# Patient Record
Sex: Male | Born: 1955 | Race: White | Hispanic: No | Smoking: Never smoker
Health system: Southern US, Community
[De-identification: ages and names within clinical notes are randomized; demographics above are authoritative.]

## PROBLEM LIST (undated history)

## (undated) DIAGNOSIS — F319 Bipolar disorder, unspecified: Secondary | ICD-10-CM

## (undated) DIAGNOSIS — E119 Type 2 diabetes mellitus without complications: Secondary | ICD-10-CM

## (undated) DIAGNOSIS — M1990 Unspecified osteoarthritis, unspecified site: Secondary | ICD-10-CM

## (undated) DIAGNOSIS — J45909 Unspecified asthma, uncomplicated: Secondary | ICD-10-CM

## (undated) DIAGNOSIS — D649 Anemia, unspecified: Secondary | ICD-10-CM

## (undated) DIAGNOSIS — I10 Essential (primary) hypertension: Secondary | ICD-10-CM

## (undated) DIAGNOSIS — G4730 Sleep apnea, unspecified: Secondary | ICD-10-CM

## (undated) DIAGNOSIS — K219 Gastro-esophageal reflux disease without esophagitis: Secondary | ICD-10-CM

## (undated) HISTORY — PX: CHOLECYSTECTOMY: SHX55

## (undated) HISTORY — PX: KNEE ARTHROSCOPY: SUR90

## (undated) HISTORY — PX: INNER EAR SURGERY: SHX679

## (undated) HISTORY — PX: APPENDECTOMY: SHX54

---

## 2014-01-06 ENCOUNTER — Ambulatory Visit: Payer: Self-pay | Admitting: Neurology

## 2014-04-26 DIAGNOSIS — M2040 Other hammer toe(s) (acquired), unspecified foot: Secondary | ICD-10-CM

## 2014-04-26 HISTORY — DX: Other hammer toe(s) (acquired), unspecified foot: M20.40

## 2015-03-22 DIAGNOSIS — K6289 Other specified diseases of anus and rectum: Secondary | ICD-10-CM | POA: Diagnosis not present

## 2015-03-22 DIAGNOSIS — J45909 Unspecified asthma, uncomplicated: Secondary | ICD-10-CM | POA: Diagnosis not present

## 2015-03-22 DIAGNOSIS — K613 Ischiorectal abscess: Secondary | ICD-10-CM | POA: Diagnosis not present

## 2015-04-03 DIAGNOSIS — Z23 Encounter for immunization: Secondary | ICD-10-CM | POA: Diagnosis not present

## 2015-05-03 DIAGNOSIS — Z125 Encounter for screening for malignant neoplasm of prostate: Secondary | ICD-10-CM | POA: Diagnosis not present

## 2015-05-03 DIAGNOSIS — Q984 Klinefelter syndrome, unspecified: Secondary | ICD-10-CM | POA: Diagnosis not present

## 2015-05-03 DIAGNOSIS — E291 Testicular hypofunction: Secondary | ICD-10-CM | POA: Diagnosis not present

## 2015-05-03 DIAGNOSIS — Z862 Personal history of diseases of the blood and blood-forming organs and certain disorders involving the immune mechanism: Secondary | ICD-10-CM | POA: Diagnosis not present

## 2015-05-03 DIAGNOSIS — J45909 Unspecified asthma, uncomplicated: Secondary | ICD-10-CM | POA: Diagnosis not present

## 2015-05-12 DIAGNOSIS — M8589 Other specified disorders of bone density and structure, multiple sites: Secondary | ICD-10-CM | POA: Diagnosis not present

## 2015-05-31 DIAGNOSIS — E291 Testicular hypofunction: Secondary | ICD-10-CM | POA: Diagnosis not present

## 2015-05-31 DIAGNOSIS — R350 Frequency of micturition: Secondary | ICD-10-CM | POA: Diagnosis not present

## 2015-05-31 DIAGNOSIS — Z6839 Body mass index (BMI) 39.0-39.9, adult: Secondary | ICD-10-CM | POA: Diagnosis not present

## 2015-05-31 DIAGNOSIS — M79642 Pain in left hand: Secondary | ICD-10-CM | POA: Diagnosis not present

## 2015-06-02 DIAGNOSIS — M1812 Unilateral primary osteoarthritis of first carpometacarpal joint, left hand: Secondary | ICD-10-CM | POA: Diagnosis not present

## 2015-06-02 DIAGNOSIS — M19042 Primary osteoarthritis, left hand: Secondary | ICD-10-CM | POA: Diagnosis not present

## 2015-06-02 DIAGNOSIS — E291 Testicular hypofunction: Secondary | ICD-10-CM | POA: Diagnosis not present

## 2015-06-02 DIAGNOSIS — M79642 Pain in left hand: Secondary | ICD-10-CM | POA: Diagnosis not present

## 2015-06-07 DIAGNOSIS — Z0000 Encounter for general adult medical examination without abnormal findings: Secondary | ICD-10-CM | POA: Diagnosis not present

## 2015-06-07 DIAGNOSIS — Z9181 History of falling: Secondary | ICD-10-CM | POA: Diagnosis not present

## 2015-06-07 DIAGNOSIS — Z1389 Encounter for screening for other disorder: Secondary | ICD-10-CM | POA: Diagnosis not present

## 2015-06-09 DIAGNOSIS — J342 Deviated nasal septum: Secondary | ICD-10-CM | POA: Diagnosis not present

## 2015-06-09 DIAGNOSIS — H6123 Impacted cerumen, bilateral: Secondary | ICD-10-CM | POA: Diagnosis not present

## 2015-06-21 DIAGNOSIS — M2550 Pain in unspecified joint: Secondary | ICD-10-CM | POA: Diagnosis not present

## 2015-06-21 DIAGNOSIS — Q984 Klinefelter syndrome, unspecified: Secondary | ICD-10-CM | POA: Diagnosis not present

## 2015-06-21 DIAGNOSIS — N62 Hypertrophy of breast: Secondary | ICD-10-CM | POA: Diagnosis not present

## 2015-06-21 DIAGNOSIS — Z6839 Body mass index (BMI) 39.0-39.9, adult: Secondary | ICD-10-CM | POA: Diagnosis not present

## 2015-07-04 DIAGNOSIS — E291 Testicular hypofunction: Secondary | ICD-10-CM | POA: Diagnosis not present

## 2015-07-11 DIAGNOSIS — L820 Inflamed seborrheic keratosis: Secondary | ICD-10-CM | POA: Diagnosis not present

## 2015-07-11 DIAGNOSIS — L219 Seborrheic dermatitis, unspecified: Secondary | ICD-10-CM | POA: Diagnosis not present

## 2015-07-27 DIAGNOSIS — M9902 Segmental and somatic dysfunction of thoracic region: Secondary | ICD-10-CM | POA: Diagnosis not present

## 2015-07-27 DIAGNOSIS — M9903 Segmental and somatic dysfunction of lumbar region: Secondary | ICD-10-CM | POA: Diagnosis not present

## 2015-07-27 DIAGNOSIS — S39012A Strain of muscle, fascia and tendon of lower back, initial encounter: Secondary | ICD-10-CM | POA: Diagnosis not present

## 2015-07-27 DIAGNOSIS — S29019A Strain of muscle and tendon of unspecified wall of thorax, initial encounter: Secondary | ICD-10-CM | POA: Diagnosis not present

## 2015-07-31 DIAGNOSIS — M9902 Segmental and somatic dysfunction of thoracic region: Secondary | ICD-10-CM | POA: Diagnosis not present

## 2015-07-31 DIAGNOSIS — M9903 Segmental and somatic dysfunction of lumbar region: Secondary | ICD-10-CM | POA: Diagnosis not present

## 2015-07-31 DIAGNOSIS — S39012A Strain of muscle, fascia and tendon of lower back, initial encounter: Secondary | ICD-10-CM | POA: Diagnosis not present

## 2015-07-31 DIAGNOSIS — S29019A Strain of muscle and tendon of unspecified wall of thorax, initial encounter: Secondary | ICD-10-CM | POA: Diagnosis not present

## 2015-08-02 DIAGNOSIS — S39012A Strain of muscle, fascia and tendon of lower back, initial encounter: Secondary | ICD-10-CM | POA: Diagnosis not present

## 2015-08-02 DIAGNOSIS — M9902 Segmental and somatic dysfunction of thoracic region: Secondary | ICD-10-CM | POA: Diagnosis not present

## 2015-08-02 DIAGNOSIS — M9903 Segmental and somatic dysfunction of lumbar region: Secondary | ICD-10-CM | POA: Diagnosis not present

## 2015-08-02 DIAGNOSIS — S29019A Strain of muscle and tendon of unspecified wall of thorax, initial encounter: Secondary | ICD-10-CM | POA: Diagnosis not present

## 2015-08-03 DIAGNOSIS — E291 Testicular hypofunction: Secondary | ICD-10-CM | POA: Diagnosis not present

## 2015-08-04 DIAGNOSIS — Z136 Encounter for screening for cardiovascular disorders: Secondary | ICD-10-CM | POA: Diagnosis not present

## 2015-08-04 DIAGNOSIS — Z79899 Other long term (current) drug therapy: Secondary | ICD-10-CM | POA: Diagnosis not present

## 2015-08-07 DIAGNOSIS — M19041 Primary osteoarthritis, right hand: Secondary | ICD-10-CM | POA: Diagnosis not present

## 2015-08-07 DIAGNOSIS — M1990 Unspecified osteoarthritis, unspecified site: Secondary | ICD-10-CM | POA: Diagnosis not present

## 2015-08-07 DIAGNOSIS — M19042 Primary osteoarthritis, left hand: Secondary | ICD-10-CM | POA: Diagnosis not present

## 2015-08-07 DIAGNOSIS — R768 Other specified abnormal immunological findings in serum: Secondary | ICD-10-CM | POA: Diagnosis not present

## 2015-08-07 DIAGNOSIS — M79646 Pain in unspecified finger(s): Secondary | ICD-10-CM | POA: Diagnosis not present

## 2015-08-07 DIAGNOSIS — M79643 Pain in unspecified hand: Secondary | ICD-10-CM | POA: Diagnosis not present

## 2015-08-24 DIAGNOSIS — Z6839 Body mass index (BMI) 39.0-39.9, adult: Secondary | ICD-10-CM | POA: Diagnosis not present

## 2015-08-24 DIAGNOSIS — M545 Low back pain: Secondary | ICD-10-CM | POA: Diagnosis not present

## 2015-08-24 DIAGNOSIS — M1990 Unspecified osteoarthritis, unspecified site: Secondary | ICD-10-CM | POA: Diagnosis not present

## 2015-08-24 DIAGNOSIS — R809 Proteinuria, unspecified: Secondary | ICD-10-CM | POA: Diagnosis not present

## 2015-08-24 DIAGNOSIS — R768 Other specified abnormal immunological findings in serum: Secondary | ICD-10-CM | POA: Diagnosis not present

## 2015-08-24 DIAGNOSIS — R109 Unspecified abdominal pain: Secondary | ICD-10-CM | POA: Diagnosis not present

## 2015-08-24 DIAGNOSIS — M79643 Pain in unspecified hand: Secondary | ICD-10-CM | POA: Diagnosis not present

## 2015-08-24 DIAGNOSIS — M47816 Spondylosis without myelopathy or radiculopathy, lumbar region: Secondary | ICD-10-CM | POA: Diagnosis not present

## 2015-08-29 DIAGNOSIS — R809 Proteinuria, unspecified: Secondary | ICD-10-CM | POA: Diagnosis not present

## 2015-08-29 DIAGNOSIS — R109 Unspecified abdominal pain: Secondary | ICD-10-CM | POA: Diagnosis not present

## 2015-08-29 DIAGNOSIS — N289 Disorder of kidney and ureter, unspecified: Secondary | ICD-10-CM | POA: Diagnosis not present

## 2015-09-05 DIAGNOSIS — E291 Testicular hypofunction: Secondary | ICD-10-CM | POA: Diagnosis not present

## 2015-09-12 DIAGNOSIS — Z6841 Body Mass Index (BMI) 40.0 and over, adult: Secondary | ICD-10-CM | POA: Diagnosis not present

## 2015-09-12 DIAGNOSIS — R109 Unspecified abdominal pain: Secondary | ICD-10-CM | POA: Diagnosis not present

## 2015-09-12 DIAGNOSIS — N281 Cyst of kidney, acquired: Secondary | ICD-10-CM | POA: Diagnosis not present

## 2015-09-27 DIAGNOSIS — N2889 Other specified disorders of kidney and ureter: Secondary | ICD-10-CM | POA: Diagnosis not present

## 2015-09-27 DIAGNOSIS — N200 Calculus of kidney: Secondary | ICD-10-CM | POA: Diagnosis not present

## 2015-09-28 DIAGNOSIS — N281 Cyst of kidney, acquired: Secondary | ICD-10-CM | POA: Diagnosis not present

## 2015-09-28 DIAGNOSIS — N401 Enlarged prostate with lower urinary tract symptoms: Secondary | ICD-10-CM | POA: Diagnosis not present

## 2015-09-28 DIAGNOSIS — N200 Calculus of kidney: Secondary | ICD-10-CM | POA: Diagnosis not present

## 2015-09-28 DIAGNOSIS — Z6839 Body mass index (BMI) 39.0-39.9, adult: Secondary | ICD-10-CM | POA: Diagnosis not present

## 2015-10-18 DIAGNOSIS — E291 Testicular hypofunction: Secondary | ICD-10-CM | POA: Diagnosis not present

## 2015-10-19 DIAGNOSIS — N200 Calculus of kidney: Secondary | ICD-10-CM | POA: Diagnosis not present

## 2015-10-19 DIAGNOSIS — Z6839 Body mass index (BMI) 39.0-39.9, adult: Secondary | ICD-10-CM | POA: Diagnosis not present

## 2015-11-22 DIAGNOSIS — E291 Testicular hypofunction: Secondary | ICD-10-CM | POA: Diagnosis not present

## 2015-11-23 DIAGNOSIS — M7580 Other shoulder lesions, unspecified shoulder: Secondary | ICD-10-CM | POA: Diagnosis not present

## 2015-11-23 DIAGNOSIS — M79643 Pain in unspecified hand: Secondary | ICD-10-CM | POA: Diagnosis not present

## 2015-11-23 DIAGNOSIS — M1990 Unspecified osteoarthritis, unspecified site: Secondary | ICD-10-CM | POA: Diagnosis not present

## 2015-11-23 DIAGNOSIS — R768 Other specified abnormal immunological findings in serum: Secondary | ICD-10-CM | POA: Diagnosis not present

## 2015-11-23 DIAGNOSIS — M545 Low back pain: Secondary | ICD-10-CM | POA: Diagnosis not present

## 2015-12-12 DIAGNOSIS — N62 Hypertrophy of breast: Secondary | ICD-10-CM | POA: Diagnosis not present

## 2015-12-12 DIAGNOSIS — E291 Testicular hypofunction: Secondary | ICD-10-CM | POA: Diagnosis not present

## 2015-12-12 DIAGNOSIS — Q984 Klinefelter syndrome, unspecified: Secondary | ICD-10-CM | POA: Diagnosis not present

## 2015-12-12 DIAGNOSIS — Z5181 Encounter for therapeutic drug level monitoring: Secondary | ICD-10-CM | POA: Diagnosis not present

## 2015-12-12 DIAGNOSIS — Z833 Family history of diabetes mellitus: Secondary | ICD-10-CM | POA: Diagnosis not present

## 2015-12-12 DIAGNOSIS — Z8349 Family history of other endocrine, nutritional and metabolic diseases: Secondary | ICD-10-CM | POA: Diagnosis not present

## 2015-12-12 DIAGNOSIS — Z125 Encounter for screening for malignant neoplasm of prostate: Secondary | ICD-10-CM | POA: Diagnosis not present

## 2015-12-12 DIAGNOSIS — R7309 Other abnormal glucose: Secondary | ICD-10-CM | POA: Diagnosis not present

## 2015-12-12 DIAGNOSIS — Z79899 Other long term (current) drug therapy: Secondary | ICD-10-CM | POA: Diagnosis not present

## 2015-12-20 DIAGNOSIS — E119 Type 2 diabetes mellitus without complications: Secondary | ICD-10-CM | POA: Diagnosis not present

## 2015-12-20 DIAGNOSIS — Z1389 Encounter for screening for other disorder: Secondary | ICD-10-CM | POA: Diagnosis not present

## 2015-12-20 DIAGNOSIS — Z6839 Body mass index (BMI) 39.0-39.9, adult: Secondary | ICD-10-CM | POA: Diagnosis not present

## 2015-12-25 DIAGNOSIS — L03031 Cellulitis of right toe: Secondary | ICD-10-CM | POA: Diagnosis not present

## 2015-12-25 DIAGNOSIS — B351 Tinea unguium: Secondary | ICD-10-CM | POA: Insufficient documentation

## 2015-12-25 DIAGNOSIS — E119 Type 2 diabetes mellitus without complications: Secondary | ICD-10-CM | POA: Insufficient documentation

## 2015-12-25 HISTORY — DX: Type 2 diabetes mellitus without complications: E11.9

## 2015-12-25 HISTORY — DX: Tinea unguium: B35.1

## 2015-12-27 DIAGNOSIS — Z8349 Family history of other endocrine, nutritional and metabolic diseases: Secondary | ICD-10-CM | POA: Diagnosis not present

## 2015-12-27 DIAGNOSIS — N62 Hypertrophy of breast: Secondary | ICD-10-CM | POA: Diagnosis not present

## 2015-12-27 DIAGNOSIS — Z5181 Encounter for therapeutic drug level monitoring: Secondary | ICD-10-CM | POA: Diagnosis not present

## 2015-12-27 DIAGNOSIS — Q984 Klinefelter syndrome, unspecified: Secondary | ICD-10-CM | POA: Diagnosis not present

## 2015-12-27 DIAGNOSIS — E291 Testicular hypofunction: Secondary | ICD-10-CM | POA: Diagnosis not present

## 2015-12-27 DIAGNOSIS — Z833 Family history of diabetes mellitus: Secondary | ICD-10-CM | POA: Diagnosis not present

## 2015-12-27 DIAGNOSIS — E1142 Type 2 diabetes mellitus with diabetic polyneuropathy: Secondary | ICD-10-CM | POA: Diagnosis not present

## 2015-12-27 DIAGNOSIS — B353 Tinea pedis: Secondary | ICD-10-CM | POA: Diagnosis not present

## 2015-12-27 DIAGNOSIS — E1165 Type 2 diabetes mellitus with hyperglycemia: Secondary | ICD-10-CM | POA: Diagnosis not present

## 2015-12-28 DIAGNOSIS — R69 Illness, unspecified: Secondary | ICD-10-CM | POA: Diagnosis not present

## 2016-01-01 DIAGNOSIS — E119 Type 2 diabetes mellitus without complications: Secondary | ICD-10-CM | POA: Diagnosis not present

## 2016-01-04 DIAGNOSIS — R062 Wheezing: Secondary | ICD-10-CM | POA: Diagnosis not present

## 2016-01-04 DIAGNOSIS — J309 Allergic rhinitis, unspecified: Secondary | ICD-10-CM | POA: Diagnosis not present

## 2016-01-04 DIAGNOSIS — Z6839 Body mass index (BMI) 39.0-39.9, adult: Secondary | ICD-10-CM | POA: Diagnosis not present

## 2016-01-04 DIAGNOSIS — E119 Type 2 diabetes mellitus without complications: Secondary | ICD-10-CM | POA: Diagnosis not present

## 2016-01-24 DIAGNOSIS — Z6839 Body mass index (BMI) 39.0-39.9, adult: Secondary | ICD-10-CM | POA: Diagnosis not present

## 2016-01-24 DIAGNOSIS — Q984 Klinefelter syndrome, unspecified: Secondary | ICD-10-CM | POA: Diagnosis not present

## 2016-01-24 DIAGNOSIS — E291 Testicular hypofunction: Secondary | ICD-10-CM | POA: Diagnosis not present

## 2016-01-26 DIAGNOSIS — E291 Testicular hypofunction: Secondary | ICD-10-CM | POA: Diagnosis not present

## 2016-01-26 DIAGNOSIS — R69 Illness, unspecified: Secondary | ICD-10-CM | POA: Diagnosis not present

## 2016-01-29 DIAGNOSIS — R69 Illness, unspecified: Secondary | ICD-10-CM | POA: Diagnosis not present

## 2016-01-31 DIAGNOSIS — E119 Type 2 diabetes mellitus without complications: Secondary | ICD-10-CM | POA: Diagnosis not present

## 2016-02-13 ENCOUNTER — Encounter (HOSPITAL_COMMUNITY): Payer: Self-pay | Admitting: *Deleted

## 2016-02-13 ENCOUNTER — Inpatient Hospital Stay (HOSPITAL_COMMUNITY)
Admission: AD | Admit: 2016-02-13 | Discharge: 2016-02-16 | DRG: 885 | Disposition: A | Discharge status: Home / Self Care | Payer: Medicare HMO | Attending: Psychiatry | Admitting: Psychiatry

## 2016-02-13 DIAGNOSIS — Z888 Allergy status to other drugs, medicaments and biological substances status: Secondary | ICD-10-CM | POA: Diagnosis not present

## 2016-02-13 DIAGNOSIS — Z79899 Other long term (current) drug therapy: Secondary | ICD-10-CM | POA: Diagnosis not present

## 2016-02-13 DIAGNOSIS — I10 Essential (primary) hypertension: Secondary | ICD-10-CM | POA: Diagnosis present

## 2016-02-13 DIAGNOSIS — R45850 Homicidal ideations: Secondary | ICD-10-CM | POA: Diagnosis present

## 2016-02-13 DIAGNOSIS — Z7984 Long term (current) use of oral hypoglycemic drugs: Secondary | ICD-10-CM

## 2016-02-13 DIAGNOSIS — F3130 Bipolar disorder, current episode depressed, mild or moderate severity, unspecified: Secondary | ICD-10-CM

## 2016-02-13 DIAGNOSIS — E119 Type 2 diabetes mellitus without complications: Secondary | ICD-10-CM | POA: Diagnosis present

## 2016-02-13 DIAGNOSIS — F319 Bipolar disorder, unspecified: Secondary | ICD-10-CM | POA: Diagnosis present

## 2016-02-13 HISTORY — DX: Unspecified asthma, uncomplicated: J45.909

## 2016-02-13 HISTORY — DX: Gastro-esophageal reflux disease without esophagitis: K21.9

## 2016-02-13 HISTORY — DX: Anemia, unspecified: D64.9

## 2016-02-13 HISTORY — DX: Unspecified osteoarthritis, unspecified site: M19.90

## 2016-02-13 HISTORY — DX: Sleep apnea, unspecified: G47.30

## 2016-02-13 HISTORY — DX: Bipolar disorder, unspecified: F31.9

## 2016-02-13 HISTORY — DX: Bipolar disorder, current episode depressed, mild or moderate severity, unspecified: F31.30

## 2016-02-13 HISTORY — DX: Type 2 diabetes mellitus without complications: E11.9

## 2016-02-13 HISTORY — DX: Essential (primary) hypertension: I10

## 2016-02-13 LAB — GLUCOSE, CAPILLARY: GLUCOSE-CAPILLARY: 104 mg/dL — AB (ref 65–99)

## 2016-02-13 MED ORDER — LORAZEPAM 1 MG PO TABS: 1.0000 mg | ORAL_TABLET | Freq: Four times a day (QID) | ORAL | Status: DC | PRN

## 2016-02-13 MED ORDER — ALUM & MAG HYDROXIDE-SIMETH 200-200-20 MG/5ML PO SUSP: 30.0000 mL | ORAL | Status: DC | PRN

## 2016-02-13 MED ORDER — OXCARBAZEPINE 300 MG PO TABS
300.0000 mg | ORAL_TABLET | Freq: Two times a day (BID) | ORAL | Status: DC
  Filled 2016-02-13 (×4): qty 1

## 2016-02-13 MED ORDER — MAGNESIUM HYDROXIDE 400 MG/5ML PO SUSP
30.0000 mL | Freq: Every day | ORAL | Status: DC | PRN
Start: 2016-02-13 — End: 2016-02-17
  Administered 2016-02-16: 30 mL via ORAL
  Filled 2016-02-13: qty 30

## 2016-02-13 MED ORDER — METFORMIN HCL ER 500 MG PO TB24
2000.0000 mg | ORAL_TABLET | Freq: Every day | ORAL | Status: DC
  Filled 2016-02-13 (×2): qty 4

## 2016-02-13 MED ORDER — BENZTROPINE MESYLATE 0.5 MG PO TABS
0.5000 mg | ORAL_TABLET | Freq: Two times a day (BID) | ORAL | Status: DC
  Filled 2016-02-13 (×4): qty 1

## 2016-02-13 MED ORDER — NICOTINE 21 MG/24HR TD PT24
21.0000 mg | MEDICATED_PATCH | Freq: Every day | TRANSDERMAL | Status: DC
Start: 2016-02-13 — End: 2016-02-13
  Filled 2016-02-13 (×2): qty 1

## 2016-02-13 MED ORDER — TRAZODONE HCL 50 MG PO TABS
50.0000 mg | ORAL_TABLET | Freq: Every evening | ORAL | Status: DC | PRN
  Administered 2016-02-14 – 2016-02-15 (×2): 50 mg via ORAL
  Filled 2016-02-13 (×2): qty 1

## 2016-02-13 MED ORDER — ACETAMINOPHEN 325 MG PO TABS: 650.0000 mg | ORAL_TABLET | Freq: Four times a day (QID) | ORAL | Status: DC | PRN

## 2016-02-13 MED ORDER — HALOPERIDOL 2 MG PO TABS
2.0000 mg | ORAL_TABLET | Freq: Two times a day (BID) | ORAL | Status: DC
  Administered 2016-02-13 – 2016-02-16 (×5): 2 mg via ORAL
  Filled 2016-02-13 (×10): qty 1

## 2016-02-13 NOTE — [facility] Group Notes (Signed)
[place] LCSW Group Therapy 02/13/2016 1:15 PM  Type of Therapy: Group Therapy- Feelings about Diagnosis  Participation Level: Active   Participation Quality:  Appropriate  Affect:  Appropriate  Cognitive: Alert and Oriented   Insight:  Developing   Engagement in Therapy: Developing/Improving and Engaged   Modes of Intervention: Clarification, Confrontation, Discussion, Education, Exploration, Limit-setting, Orientation, Problem-solving, Rapport Building, Art therapist, Socialization and Support  Description of Group:   This group will allow patients to explore their thoughts and feelings about diagnoses they have received. Patients will be guided to explore their level of understanding and acceptance of these diagnoses. Facilitator will encourage patients to process their thoughts and feelings about the reactions of others to their diagnosis, and will guide patients in identifying ways to discuss their diagnosis with significant others in their lives. This group will be process-oriented, with patients participating in exploration of their own experiences as well as giving and receiving support and challenge from other group members.  Summary of Progress/Problems:  Pt participated in group discussion and was able to discuss symptoms and stigma.  Therapeutic Modalities:   Cognitive Behavioral Therapy Solution Focused Therapy Motivational Interviewing Relapse Prevention Therapy  Peri Maris, LCSWA 02/13/2016 4:54 PM

## 2016-02-13 NOTE — Progress Notes (Signed)
Adult Psychoeducational Group Note  Date:  02/13/2016 Time:  9:26 PM  Group Topic/Focus:  Wrap-Up Group:   The focus of this group is to help patients review their daily goal of treatment and discuss progress on daily workbooks.   Participation Level:  Active  Participation Quality:  Appropriate  Affect:  Appropriate  Cognitive:  Alert  Insight: Appropriate  Engagement in Group:  Engaged  Modes of Intervention:  Discussion  Additional Comments:  On a scale between 1-10, (1=worse, 10=best) patient rated his day a 7. Patient's goal for today was to get in touch with his pastor and brother. Patient met goal.  Peter Buck 02/13/2016, 9:26 PM
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
## 2016-02-13 NOTE — Tx Team (Signed)
Initial Treatment Plan 02/13/2016 1:12 PM Peter Buck [id number]    PATIENT STRESSORS: Health problems Medication change or noncompliance   PATIENT STRENGTHS: Ability for insight Average or above average intelligence Capable of independent living Communication skills Financial means General fund of knowledge Motivation for treatment/growth Religious Affiliation   PATIENT IDENTIFIED PROBLEMS: "Well, my goal is to be released."  "I want help with getting a therapist who knows about Bipolar."                    DISCHARGE CRITERIA:  Improved stabilization in mood, thinking, and/or behavior Need for constant or close observation no longer present Reduction of life-threatening or endangering symptoms to within safe limits Verbal commitment to aftercare and medication compliance  PRELIMINARY DISCHARGE PLAN: Attend aftercare/continuing care group Outpatient therapy Return to previous living arrangement  PATIENT/FAMILY INVOLVEMENT: This treatment plan has been presented to and reviewed with the patient, Peter Buck, and/or family member.  The patient and family have been given the opportunity to ask questions and make suggestions.  Jamie Kato, RN 02/13/2016, 1:12 PM

## 2016-02-13 NOTE — Progress Notes (Signed)
Recreation Therapy Notes  Animal-Assisted Activity (AAA) Program Checklist/Progress Notes Patient Eligibility Criteria Checklist & Daily Group note for Rec TxIntervention  Date: 08.29.2017 Time: 2:45pm Location: [address]   AAA/T Program Assumption of Risk Form signed by Patient/ or Parent Legal Guardian Yes  Patient is free of allergies or sever asthma Yes  Patient reports no fear of animals Yes  Patient reports no history of cruelty to animals Yes  Patient understands his/her participation is voluntary Yes  Patient washes hands before animal contact Yes  Patient washes hands after animal contact Yes  Behavioral Response: Engaged, Attentive, Appropriate   Education:Hand Washing, Appropriate Animal Interaction   Education Outcome: Acknowledges education.   Clinical Observations/Feedback: Patient attended session and interacted appropriately with therapy dog and peers.   Laureen Ochs Ellamae Lybeck, LRT/CTRS   Dori Devino L 02/13/2016 3:03 PM

## 2016-02-14 DIAGNOSIS — F319 Bipolar disorder, unspecified: Secondary | ICD-10-CM

## 2016-02-14 DIAGNOSIS — Z79899 Other long term (current) drug therapy: Secondary | ICD-10-CM

## 2016-02-14 DIAGNOSIS — Z888 Allergy status to other drugs, medicaments and biological substances status: Secondary | ICD-10-CM

## 2016-02-14 LAB — GLUCOSE, CAPILLARY
Glucose-Capillary: 130 mg/dL — ABNORMAL HIGH (ref 65–99)
Glucose-Capillary: 123 mg/dL — ABNORMAL HIGH (ref 65–99)

## 2016-02-14 MED ORDER — METFORMIN HCL ER 500 MG PO TB24
2000.0000 mg | ORAL_TABLET | Freq: Every day | ORAL | Status: DC
Start: 2016-02-14 — End: 2016-02-14
  Filled 2016-02-14: qty 1

## 2016-02-14 MED ORDER — DICLOFENAC SODIUM 75 MG PO TBEC
75.0000 mg | DELAYED_RELEASE_TABLET | Freq: Two times a day (BID) | ORAL | Status: DC | PRN
  Administered 2016-02-14: 75 mg via ORAL

## 2016-02-14 MED ORDER — HALOPERIDOL 2 MG PO TABS
2.0000 mg | ORAL_TABLET | Freq: Once | ORAL | Status: AC
  Administered 2016-02-14: 2 mg via ORAL
  Filled 2016-02-14: qty 1

## 2016-02-14 MED ORDER — OXCARBAZEPINE 300 MG PO TABS
300.0000 mg | ORAL_TABLET | Freq: Every day | ORAL | Status: DC
  Administered 2016-02-15 – 2016-02-16 (×2): 300 mg via ORAL
  Filled 2016-02-14 (×4): qty 1

## 2016-02-14 MED ORDER — BENZTROPINE MESYLATE 0.5 MG PO TABS: 0.5000 mg | ORAL_TABLET | Freq: Two times a day (BID) | ORAL | Status: DC | PRN

## 2016-02-14 MED ORDER — METFORMIN HCL ER 500 MG PO TB24
2000.0000 mg | ORAL_TABLET | Freq: Every day | ORAL | Status: DC
  Administered 2016-02-14 – 2016-02-15 (×2): 2000 mg via ORAL
  Filled 2016-02-14 (×4): qty 4

## 2016-02-14 NOTE — Progress Notes (Signed)
D: Pt presents with flat affect and depressed mood. Pt mood is  labile. Pt becomes agitated with himself, unprovoked and no triggers. Pt verbalized having thoughts to punch the wall or cut himself. Writer was able to help pt relax by doing a crossword puzzle and listening to [employer]. Pt was offered prn Ativan for agitation. Pt refused, stating that the meds make him feel droopy. Pt refused to take Trileptal and Cogentin this morning stating that if makes him droopy. MD made aware.  A: Medications offered as ordered. Orders reviewed with pt. Verbal support provided. Pt encouraged to attend groups. 15 minute checks performed for safety. R: Pt stated goal is to reschedule his eye exam. Writer provided pt with the number to [facility] and pt was able to reschedule his appt.
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
## 2016-02-14 NOTE — Progress Notes (Signed)
   D: Pt appeared to be focused on his metformin. Informed the writer that he takes it at supper. Informed pt that supper is 1700 at [facility]. Stated that he'd rather take it at that time, than morning. Pt also requested to get a dose for today, since he'd missed today's dose.  Pt has no other questions or concerns.    A:Order was modified to meet pt's needs. However, dose wasn't approved for tonight's dose.  Support and encouragement was offered. 15 min checks continued for safety.  R: Pt remains safe.
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
## 2016-02-14 NOTE — Progress Notes (Signed)
Recreation Therapy Notes  Date: 02/14/16 Time: 0930 Location: [address] Dayroom  Group Topic: Stress Management  Goal Area(s) Addresses:  Patient will verbalize importance of using healthy stress management.  Patient will identify positive emotions associated with healthy stress management.   Intervention: Stress Management   Activity :  Progressive Muscle Relaxation.  LRT introduced to technique of progressive muscle relaxation to patients.  LRT read a script to guide the patients through the exercise.  Patients were to follow along as LRT read the script to engage in the activity.  Education:  Stress Management, Discharge Planning.   Education Outcome: Needs additional education  Clinical Observations/Feedback:  Pt did not attend group.    Victorino Sparrow, LRT/CTRS         Ria Comment, Ruvim Risko A 02/14/2016 3:02 PM

## 2016-02-14 NOTE — BHH Group Notes (Signed)
[place] LCSW Group Therapy 02/14/2016 1:15 PM  Type of Therapy: Group Therapy- Emotion Regulation  Participation Level: Active   Participation Quality:  Appropriate  Affect: Appropriate  Cognitive: Alert and Oriented   Insight:  Developing/Improving  Engagement in Therapy: Developing/Improving and Engaged   Modes of Intervention: Clarification, Confrontation, Discussion, Education, Exploration, Limit-setting, Orientation, Problem-solving, Rapport Building, Art therapist, Socialization and Support  Summary of Progress/Problems: The topic for group today was emotional regulation. This group focused on both positive and negative emotion identification and allowed group members to process ways to identify feelings, regulate negative emotions, and find healthy ways to manage internal/external emotions. Group members were asked to reflect on a time when their reaction to an emotion led to a negative outcome and explored how alternative responses using emotion regulation would have benefited them. Group members were also asked to discuss a time when emotion regulation was utilized when a negative emotion was experienced. Pt expressed that anger is the emotion he has the most difficulty regulating. He interacted well with patients and identified coping skills and strategies to implement at discharge.   Peri Maris, LCSWA 02/14/2016 4:36 PM

## 2016-02-14 NOTE — Tx Team (Signed)
Interdisciplinary Treatment and Diagnostic Plan Update  02/14/2016 Time of Session: 8:02 AM  Peter Buck MRN: [id number]  Principal Diagnosis: <principal problem not specified>  Secondary Diagnoses: Active Problems:   Bipolar disorder ([place])   Current Medications:  Current Facility-Administered Medications  Medication Dose Route Frequency Provider Last Rate Last Dose  . acetaminophen (TYLENOL) tablet 650 mg  650 mg Oral Q6H PRN Niel Hummer, NP      . alum & mag hydroxide-simeth (MAALOX/MYLANTA) 200-200-20 MG/5ML suspension 30 mL  30 mL Oral Q4H PRN Niel Hummer, NP      . benztropine (COGENTIN) tablet 0.5 mg  0.5 mg Oral BID Niel Hummer, NP      . haloperidol (HALDOL) tablet 2 mg  2 mg Oral BID Niel Hummer, NP   2 mg at 02/13/16 1726  . LORazepam (ATIVAN) tablet 1 mg  1 mg Oral Q6H PRN Niel Hummer, NP      . magnesium hydroxide (MILK OF MAGNESIA) suspension 30 mL  30 mL Oral Daily PRN Niel Hummer, NP      . metFORMIN (GLUCOPHAGE-XR) 24 hr tablet 2,000 mg  2,000 mg Oral QAC supper Jenne Campus, MD      . Oxcarbazepine (TRILEPTAL) tablet 300 mg  300 mg Oral BID Niel Hummer, NP      . traZODone (DESYREL) tablet 50 mg  50 mg Oral QHS PRN Niel Hummer, NP        PTA Medications: Prescriptions Prior to Admission  Medication Sig Dispense Refill Last Dose  . albuterol (PROAIR HFA) 108 (90 Base) MCG/ACT inhaler Inhale 2 puffs into the lungs every 6 (six) hours as needed for wheezing or shortness of breath.   Past Month at Unknown time  . cetirizine (ZYRTEC) 10 MG tablet Take 10 mg by mouth every morning.    02/12/2016 at Unknown time  . diclofenac (VOLTAREN) 75 MG EC tablet Take 75 mg by mouth 2 (two) times daily as needed for moderate pain (for bone pain).    02/09/2016 at Unknown time  . haloperidol (HALDOL) 2 MG tablet Take 2 mg by mouth 2 (two) times daily.   02/12/2016 at Unknown time  . metFORMIN (GLUCOPHAGE-XR) 500 MG 24 hr tablet Take 2,000 mg by mouth daily with  supper.   02/11/2016  . mometasone (NASONEX) 50 MCG/ACT nasal spray Place 2 sprays into the nose every morning.    02/09/2016  . Oxcarbazepine (TRILEPTAL) 300 MG tablet Take 300 mg by mouth 2 (two) times daily.   02/12/2016 at Unknown time  . rosuvastatin (CRESTOR) 5 MG tablet Take 5 mg by mouth every Monday, Wednesday, and Friday at 6 PM.   02/12/2016 at Unknown time  . traMADol (ULTRAM) 50 MG tablet Take 50 mg by mouth every 6 (six) hours as needed for severe pain.   02/11/2016    Treatment Modalities: Medication Management, Group therapy, Case management,  1 to 1 session with clinician, Psychoeducation, Recreational therapy.   Physician Treatment Plan for Primary Diagnosis: <principal problem not specified> Long Term Goal(s): Improvement in symptoms so as ready for discharge  Short Term Goals: Ability to identify changes in lifestyle to reduce recurrence of condition will improve, Ability to demonstrate self-control will improve and Ability to identify and develop effective coping behaviors will improve  Medication Management: Evaluate patient's response, side effects, and tolerance of medication regimen.  Therapeutic Interventions: 1 to 1 sessions, Unit Group sessions and Medication administration.  Evaluation of Outcomes: Not  Met  Physician Treatment Plan for Secondary Diagnosis: Active Problems:   Bipolar disorder ([place])   Long Term Goal(s): Improvement in symptoms so as ready for discharge  Short Term Goals: Ability to disclose and discuss suicidal ideas, Ability to identify and develop effective coping behaviors will improve and Compliance with prescribed medications will improve  Medication Management: Evaluate patient's response, side effects, and tolerance of medication regimen.  Therapeutic Interventions: 1 to 1 sessions, Unit Group sessions and Medication administration.  Evaluation of Outcomes: Not Met   RN Treatment Plan for Primary Diagnosis: <principal problem not  specified> Long Term Goal(s): Knowledge of disease and therapeutic regimen to maintain health will improve  Short Term Goals: Ability to remain free from injury will improve, Ability to participate in decision making will improve and Compliance with prescribed medications will improve  Medication Management: RN will administer medications as ordered by provider, will assess and evaluate patient's response and provide education to patient for prescribed medication. RN will report any adverse and/or side effects to prescribing provider.  Therapeutic Interventions: 1 on 1 counseling sessions, Psychoeducation, Medication administration, Evaluate responses to treatment, Monitor vital signs and CBGs as ordered, Perform/monitor CIWA, COWS, AIMS and Fall Risk screenings as ordered, Perform wound care treatments as ordered.  Evaluation of Outcomes: Not Met   LCSW Treatment Plan for Primary Diagnosis: <principal problem not specified> Long Term Goal(s): Safe transition to appropriate next level of care at discharge, Engage patient in therapeutic group addressing interpersonal concerns.  Short Term Goals: Engage patient in aftercare planning with referrals and resources, Increase emotional regulation and Increase skills for wellness and recovery  Therapeutic Interventions: Assess for all discharge needs, 1 to 1 time with Social worker, Explore available resources and support systems, Assess for adequacy in community support network, Educate family and significant other(s) on suicide prevention, Complete Psychosocial Assessment, Interpersonal group therapy.  Evaluation of Outcomes: Not Met   Progress in Treatment: Attending groups: Pt is new to milieu, continuing to assess   Participating in groups: Pt is new to milieu, continuing to assess  Taking medication as prescribed: Yes, MD continues to assess for medication changes as needed Toleration medication: Yes, no side effects reported at this  time Family/Significant other contact made: No, CSW assessing for appropriate contact Patient understands diagnosis: Continuing to assess Discussing patient identified problems/goals with staff: Yes Medical problems stabilized or resolved: Yes Denies suicidal/homicidal ideation: Yes Issues/concerns per patient self-inventory: None Other: N/A  New problem(s) identified: None identified at this time.   New Short Term/Long Term Goal(s): None identified at this time.   Discharge Plan or Barriers: CSW will assess for appropriate discharge plan and relevant barriers.   Reason for Continuation of Hospitalization: Mood instability Depression Medication stabilization Suicidal ideation  Estimated Length of Stay: 3-5 days  Attendees: Patient: 02/14/2016  8:02 AM  Physician: Dr. Ananias Pilgrim 02/14/2016  8:02 AM  Nursing: Darrol Angel, RN; Gaylan Gerold, RN 02/14/2016  8:02 AM  RN Care Manager: Lars Pinks, RN 02/14/2016  8:02 AM  Social Worker: Peri Maris, LCSW; Kristin Drinkard, LCSW 02/14/2016  8:02 AM  Recreational Therapist:  02/14/2016  8:02 AM  Other: Lindell Spar, NP; Samuel Jester, NP 02/14/2016  8:02 AM  Other:  02/14/2016  8:02 AM  Other: 02/14/2016  8:02 AM    Scribe for Treatment Team: Bo Mcclintock, LCSW 02/14/2016 8:02 AM

## 2016-02-14 NOTE — Progress Notes (Signed)
Patient attended group, but did not wish to share.

## 2016-02-14 NOTE — H&P (Signed)
Psychiatric Admission Assessment Adult  Patient Identification: Peter Buck MRN:  [id number] Date of Evaluation:  02/14/2016 Chief Complaint:  BIPOLAR DISORDER Principal Diagnosis: <principal problem not specified> Diagnosis:   Patient Active Problem List   Diagnosis Date Noted  . Bipolar disorder ([place]) [F31.9] 02/13/2016   History of Present Illness: " I was getting angry with my Barrister's clerk at the [facility] [employer] who was demeaning and belittling me in front of others." He reported Art therapist to the student advocate who took the instructor's side. He went to PCP's office and "vented"  to the PA that he was very frustrated with this Instructor and that he will go to [place] and buy a gun and shoot this instructor and wait for police to come and kill him. Patient does not have or able to buy firearm due to his disorder. PA recommended patient voluntarily admit self to hospital and when he refused police was called who initially let him go but a petition was taken out on him and police came back and took him to [facility] who transferred him here. Associated Signs/Symptoms: Depression Symptoms:  depressed mood, psychomotor agitation, (Hypo) Manic Symptoms:  Irritable Mood, Labiality of Mood, Anxiety Symptoms:  None Psychotic Symptoms:  Paranoia,Instructor PTSD Symptoms: NA Total Time spent with patient: 1.5 hours  Past Psychiatric History: Multiple past psychiatric admissions  Is the patient at risk to self? Yes.    Has the patient been a risk to self in the past 6 months? Yes.    Has the patient been a risk to self within the distant past? Yes.    Is the patient a risk to others? Yes.    Has the patient been a risk to others in the past 6 months? Yes.    Has the patient been a risk to others within the distant past? Yes.     Prior Inpatient Therapy: Prior Inpatient Therapy: Yes Prior Therapy Dates:  (multiple) Prior Therapy Facilty/Provider(s):   (various) Reason for Treatment:  (Bipolar D/O) Prior Outpatient Therapy: Prior Outpatient Therapy: Yes Prior Therapy Dates:  (up until 2016) Prior Therapy Facilty/Provider(s):  ([facility]) Reason for Treatment:  (Bipolar D/O) Does patient have an ACCT team?: No (was discharged due to lack of IP stays) Does patient have Intensive In-House Services?  : No Does patient have [facility] services? : No Does patient have P4CC services?: Unknown  Alcohol Screening: 1. How often do you have a drink containing alcohol?: Never 9. Have you or someone else been injured as a result of your drinking?: No 10. Has a relative or friend or a doctor or another health worker been concerned about your drinking or suggested you cut down?: No Alcohol Use Disorder Identification Test Final Score (AUDIT): 0 Brief Intervention: AUDIT score less than 7 or less-screening does not suggest unhealthy drinking-brief intervention not indicated Substance Abuse History in the last 12 months:  No. Consequences of Substance Abuse: Negative Previous Psychotropic Medications: Yes  Psychological Evaluations: No  Past Medical History:  Past Medical History:  Diagnosis Date  . Anemia   . Arthritis   . Asthma   . Bipolar disorder ([place])   . Diabetes mellitus without complication ([place])   . GERD (gastroesophageal reflux disease)   . Hypertension   . Sleep apnea    History reviewed. No pertinent surgical history. Family History: History reviewed. No pertinent family history. Family Psychiatric  History: None Tobacco Screening: Have you used any form of tobacco in the last 30 days? (Cigarettes,  Smokeless Tobacco, Cigars, and/or Pipes): No Social History:  History  Alcohol Use No     History  Drug Use No    Additional Social History: Marital status: Single    Prescriptions: see MAR History of alcohol / drug use?: No history of alcohol / drug abuse                    Allergies:   Allergies  Allergen Reactions   . Augmentin [Amoxicillin-Pot Clavulanate] Anaphylaxis  . Atorvastatin Other (See Comments)    Severe back ache  . Guiatuss [Guaifenesin]   . Pseudoephedrine    Lab Results:  Results for orders placed or performed during the hospital encounter of 02/13/16 (from the past 48 hour(s))  Glucose, capillary     Status: Abnormal   Collection Time: 02/13/16  4:51 PM  Result Value Ref Range   Glucose-Capillary 104 (H) 65 - 99 mg/dL  Glucose, capillary     Status: Abnormal   Collection Time: 02/14/16  6:26 AM  Result Value Ref Range   Glucose-Capillary 130 (H) 65 - 99 mg/dL   Comment 1 Notify RN    Comment 2 Document in Chart     Blood Alcohol level:  No results found for: [facility]  Metabolic Disorder Labs:  No results found for: HGBA1C, MPG No results found for: PROLACTIN No results found for: CHOL, TRIG, HDL, CHOLHDL, VLDL, LDLCALC  Current Medications: Current Facility-Administered Medications  Medication Dose Route Frequency Provider Last Rate Last Dose  . acetaminophen (TYLENOL) tablet 650 mg  650 mg Oral Q6H PRN Niel Hummer, NP      . alum & mag hydroxide-simeth (MAALOX/MYLANTA) 200-200-20 MG/5ML suspension 30 mL  30 mL Oral Q4H PRN Niel Hummer, NP      . benztropine (COGENTIN) tablet 0.5 mg  0.5 mg Oral BID Niel Hummer, NP      . haloperidol (HALDOL) tablet 2 mg  2 mg Oral BID Niel Hummer, NP   2 mg at 02/14/16 0804  . LORazepam (ATIVAN) tablet 1 mg  1 mg Oral Q6H PRN Niel Hummer, NP      . magnesium hydroxide (MILK OF MAGNESIA) suspension 30 mL  30 mL Oral Daily PRN Niel Hummer, NP      . metFORMIN (GLUCOPHAGE-XR) 24 hr tablet 2,000 mg  2,000 mg Oral QAC supper Jenne Campus, MD      . Oxcarbazepine (TRILEPTAL) tablet 300 mg  300 mg Oral BID Niel Hummer, NP      . traZODone (DESYREL) tablet 50 mg  50 mg Oral QHS PRN Niel Hummer, NP       PTA Medications: Prescriptions Prior to Admission  Medication Sig Dispense Refill Last Dose  . albuterol (PROAIR HFA) 108  (90 Base) MCG/ACT inhaler Inhale 2 puffs into the lungs every 6 (six) hours as needed for wheezing or shortness of breath.   Past Month at Unknown time  . cetirizine (ZYRTEC) 10 MG tablet Take 10 mg by mouth every morning.    02/12/2016 at Unknown time  . diclofenac (VOLTAREN) 75 MG EC tablet Take 75 mg by mouth 2 (two) times daily as needed for moderate pain (for bone pain).    02/09/2016 at Unknown time  . haloperidol (HALDOL) 2 MG tablet Take 2 mg by mouth 2 (two) times daily.   02/12/2016 at Unknown time  . metFORMIN (GLUCOPHAGE-XR) 500 MG 24 hr tablet Take 2,000 mg by mouth daily with supper.  02/11/2016  . mometasone (NASONEX) 50 MCG/ACT nasal spray Place 2 sprays into the nose every morning.    02/09/2016  . Oxcarbazepine (TRILEPTAL) 300 MG tablet Take 300 mg by mouth 2 (two) times daily.   02/12/2016 at Unknown time  . rosuvastatin (CRESTOR) 5 MG tablet Take 5 mg by mouth every Monday, Wednesday, and Friday at 6 PM.   02/12/2016 at Unknown time  . traMADol (ULTRAM) 50 MG tablet Take 50 mg by mouth every 6 (six) hours as needed for severe pain.   02/11/2016    Musculoskeletal: Strength & Muscle Tone: within normal limits Gait & Station: normal Patient leans: N/A  Psychiatric Specialty Exam: Physical Exam  ROS  Blood pressure 139/65, pulse 80, temperature 97.8 F (36.6 C), temperature source Oral, resp. rate 18, height 6\' 4"  (1.93 m), weight (!) 144.7 kg (319 lb).Body mass index is 38.83 kg/m.  General Appearance: Casual  Eye Contact:  Good  Speech:  Clear and Coherent and Normal Rate  Volume:  Normal  Mood:  Angry and Irritable  Affect:  Labile  Thought Process:  Coherent and Goal Directed  Orientation:  Full (Time, Place, and Person)  Thought Content:  Logical  Suicidal Thoughts:  No  Homicidal Thoughts:  No  Memory:  Immediate;   Fair Recent;   Fair Remote;   Fair  Judgement:  Impaired  Insight:  Lacking  Psychomotor Activity:  Increased  Concentration:  Concentration: Fair  and Attention Span: Fair  Recall:  [employer] of Knowledge:  Fair  Language:  Good  Akathisia:  No  Handed:  Right  AIMS (if indicated):     Assets:  Communication Skills Desire for Improvement Financial Resources/Insurance Housing Social Support  ADL's:  Intact  Cognition:  WNL  Sleep:  Number of Hours: 6.5       Treatment Plan Summary: Daily contact with patient to assess and evaluate symptoms and progress in treatment, Medication management and Plan --participate in group, milieu and group activities Patient says he  is only able to take trileptal 300mg  daily, haldol 2mg  bid. H e uses CPAP at home and is requesting it here Observation Level/Precautions:  15 minute checks  Laboratory:  CBC Chemistry Profile HbAIC UA  Psychotherapy:    Medications:    Consultations:    Discharge Concerns:    Estimated LOS:  Other:     I certify that inpatient services furnished can reasonably be expected to improve the patient's condition.    Ruffin Frederick, MD 8/30/201710:36 AM

## 2016-02-14 NOTE — BHH Suicide Risk Assessment (Signed)
[place] INPATIENT:  Family/Significant Other Suicide Prevention Education  Suicide Prevention Education:  Education Completed; Taylor Brodersen, Pt''s brother [phone],  (name of family member/significant other) has been identified by the patient as the family member/significant other with whom the patient will be residing, and identified as the person(s) who will aid the patient in the event of a mental health crisis (suicidal ideations/suicide attempt).  With written consent from the patient, the family member/significant other has been provided the following suicide prevention education, prior to the and/or following the discharge of the patient.  The suicide prevention education provided includes the following:  Suicide risk factors  Suicide prevention and interventions  National Suicide Hotline telephone number  [facility] assessment telephone number  [facility] Emergency Assistance [place] and/or Residential Mobile Crisis Unit telephone number  Request made of family/significant other to:  Remove weapons (e.g., guns, rifles, knives), all items previously/currently identified as safety concern.    Remove drugs/medications (over-the-counter, prescriptions, illicit drugs), all items previously/currently identified as a safety concern.  The family member/significant other verbalizes understanding of the suicide prevention education information provided.  The family member/significant other agrees to remove the items of safety concern listed above.  Bo Mcclintock 02/14/2016, 5:06 PM

## 2016-02-15 LAB — GLUCOSE, CAPILLARY
GLUCOSE-CAPILLARY: 122 mg/dL — AB (ref 65–99)
GLUCOSE-CAPILLARY: 126 mg/dL — AB (ref 65–99)
Glucose-Capillary: 134 mg/dL — ABNORMAL HIGH (ref 65–99)

## 2016-02-15 NOTE — Progress Notes (Signed)
D: Pt denies SI/HI/AVH. Pt is pleasant and cooperative. Pt stated he was feeling a little better due to him not having wild thoughts and the medications working.   A: Pt was offered support and encouragement. Pt was given scheduled medications. Pt was encourage to attend groups. Q 15 minute checks were done for safety.   R:Pt attends groups and interacts well with peers and staff. Pt is taking medication. Pt has no complaints.Pt receptive to treatment and safety maintained on unit.

## 2016-02-15 NOTE — Progress Notes (Signed)
[facility] MD Progress Note  02/15/2016 2:26 PM Peter Buck  MRN:  [id number] Subjective:  Patient reports feeling better today, slept well and enjoying the groups including the one condcted by the [facility]. Patient discussed in team today, social worker will make calls to ACTT in patient's community to see if any of them have a lot. Patient says his brother will will pay for him to get outpatient ingividual therapy. He will also explore going back  tothe * [facility] Principal Problem: <principal problem not specified> Diagnosis:   Patient Active Problem List   Diagnosis Date Noted  . Bipolar disorder ([place]) [F31.9] 02/13/2016   Total Time spent with patient: 30 minutes  Past Psychiatric History: See Admission Assessment  Past Medical History:  Past Medical History:  Diagnosis Date  . Anemia   . Arthritis   . Asthma   . Bipolar disorder ([place])   . Diabetes mellitus without complication ([place])   . GERD (gastroesophageal reflux disease)   . Hypertension   . Sleep apnea    History reviewed. No pertinent surgical history. Family History: History reviewed. No pertinent family history. Family Psychiatric  History: None Social History:  History  Alcohol Use No     History  Drug Use No    Social History   Social History  . Marital status: Divorced    Spouse name: N/A  . Number of children: N/A  . Years of education: N/A   Social History Main Topics  . Smoking status: Never Smoker  . Smokeless tobacco: Never Used  . Alcohol use No  . Drug use: No  . Sexual activity: Not Asked   Other Topics Concern  . None   Social History Narrative  . None   Additional Social History:    Prescriptions: see MAR History of alcohol / drug use?: No history of alcohol / drug abuse                    Sleep: Good  Appetite:  Good  Current Medications: Current Facility-Administered Medications  Medication Dose Route Frequency Provider Last Rate Last Dose   . acetaminophen (TYLENOL) tablet 650 mg  650 mg Oral Q6H PRN Niel Hummer, NP      . alum & mag hydroxide-simeth (MAALOX/MYLANTA) 200-200-20 MG/5ML suspension 30 mL  30 mL Oral Q4H PRN Niel Hummer, NP      . benztropine (COGENTIN) tablet 0.5 mg  0.5 mg Oral BID PRN Sueanne Margarita, MD      . diclofenac (VOLTAREN) EC tablet 75 mg  75 mg Oral BID PRN Kerrie Buffalo, NP   75 mg at 02/14/16 1311  . haloperidol (HALDOL) tablet 2 mg  2 mg Oral BID Niel Hummer, NP   2 mg at 02/15/16 0758  . LORazepam (ATIVAN) tablet 1 mg  1 mg Oral Q6H PRN Niel Hummer, NP      . magnesium hydroxide (MILK OF MAGNESIA) suspension 30 mL  30 mL Oral Daily PRN Niel Hummer, NP      . metFORMIN (GLUCOPHAGE-XR) 24 hr tablet 2,000 mg  2,000 mg Oral QAC supper Jenne Campus, MD   2,000 mg at 02/14/16 1624  . Oxcarbazepine (TRILEPTAL) tablet 300 mg  300 mg Oral Daily Sueanne Margarita, MD   300 mg at 02/15/16 0758  . traZODone (DESYREL) tablet 50 mg  50 mg Oral QHS PRN Niel Hummer, NP   50 mg at 02/14/16 2232  Lab Results:  Results for orders placed or performed during the hospital encounter of 02/13/16 (from the past 48 hour(s))  Glucose, capillary     Status: Abnormal   Collection Time: 02/13/16  4:51 PM  Result Value Ref Range   Glucose-Capillary 104 (H) 65 - 99 mg/dL  Glucose, capillary     Status: Abnormal   Collection Time: 02/14/16  6:26 AM  Result Value Ref Range   Glucose-Capillary 130 (H) 65 - 99 mg/dL   Comment 1 Notify RN    Comment 2 Document in Chart   Glucose, capillary     Status: Abnormal   Collection Time: 02/14/16  5:08 PM  Result Value Ref Range   Glucose-Capillary 123 (H) 65 - 99 mg/dL   Comment 1 Notify RN    Comment 2 Document in Chart   Glucose, capillary     Status: Abnormal   Collection Time: 02/15/16  5:40 AM  Result Value Ref Range   Glucose-Capillary 134 (H) 65 - 99 mg/dL   Comment 1 Notify RN   Glucose, capillary     Status: Abnormal   Collection Time: 02/15/16 12:15 PM   Result Value Ref Range   Glucose-Capillary 122 (H) 65 - 99 mg/dL    Blood Alcohol level:  No results found for: [facility]  Metabolic Disorder Labs: No results found for: HGBA1C, MPG No results found for: PROLACTIN No results found for: CHOL, TRIG, HDL, CHOLHDL, VLDL, LDLCALC  Physical Findings: AIMS: Facial and Oral Movements Muscles of Facial Expression: None, normal Lips and Perioral Area: None, normal Jaw: None, normal Tongue: None, normal,Extremity Movements Upper (arms, wrists, hands, fingers): None, normal Lower (legs, knees, ankles, toes): None, normal, Trunk Movements Neck, shoulders, hips: None, normal, Overall Severity Severity of abnormal movements (highest score from questions above): None, normal Incapacitation due to abnormal movements: None, normal Patient's awareness of abnormal movements (rate only patient's report): No Awareness, Dental Status Current problems with teeth and/or dentures?: No Does patient usually wear dentures?: No  CIWA:    COWS:     Musculoskeletal: Strength & Muscle Tone: within normal limits Gait & Station: normal Patient leans: N/A  Psychiatric Specialty Exam: Physical Exam  ROS  Blood pressure 139/77, pulse 75, temperature 97.8 F (36.6 C), temperature source Oral, resp. rate 16, height 6\' 4"  (1.93 m), weight (!) 144.7 kg (319 lb).Body mass index is 38.83 kg/m.  General Appearance: Casual  Eye Contact:  Good  Speech:  Garbled  Volume:  Normal  Mood:  Euthymic  Affect:  Appropriate and Congruent  Thought Process:  Coherent and Goal Directed  Orientation:  Full (Time, Place, and Person)  Thought Content:  Logical  Suicidal Thoughts:  No  Homicidal Thoughts:  No  Memory:  Immediate;   Fair Recent;   Fair Remote;   Fair  Judgement:  Fair  Insight:  Fair  Psychomotor Activity:  Normal  Concentration:  Concentration: Fair and Attention Span: Fair  Recall:  [employer] of Knowledge:  Good  Language:  Good  Akathisia:  No   Handed:  Right  AIMS (if indicated):     Assets:  Communication Skills Desire for Improvement Financial Resources/Insurance Housing Physical Health Transportation  ADL's:  Intact  Cognition:  WNL  Sleep:  Number of Hours: 6.5     Treatment Plan Summary: Daily contact with patient to assess and evaluate symptoms and progress in treatment, Medication management and Plan Continue medications and participate in group, milieu and individual  therapies  Chinita Pester  Sigurd Sos, MD 02/15/2016, 2:26 PM

## 2016-02-15 NOTE — BHH Group Notes (Signed)
[facility] Group Therapy 02/15/2016 1:15pm  Type of Therapy: Mental Health Association Presentation  Participation Level: Active  Participation Quality: Attentive  Affect: Appropriate  Cognitive: Oriented  Insight: Developing/Improving  Engagement in Therapy: Engaged  Modes of Intervention: Discussion, Education and Socialization  Summary of Progress/Problems: Mental Health Association ([place]) Speaker came to talk about his personal journey with substance abuse and addiction. The pt processed ways by which to relate to the speaker. [place] speaker provided handouts and educational information pertaining to groups and services offered by the [facility]. Pt was engaged in speaker's presentation and was receptive to resources provided.    Peri Maris, [place] 02/15/2016 1:13 PM

## 2016-02-15 NOTE — Progress Notes (Signed)
   Pt requested to get his haldol, stating that he's having racing thoughts. Writer spoke with extender to get a one time dose for haldol. Pt also received his trazodone which he previously refused.

## 2016-02-15 NOTE — Progress Notes (Signed)
  D: Pt approached the writer and was visibly upset, and anxious. When approached pt immediately began to explain that his roommate is from a different region and likes to have the room hotter. Stating "he's not used to air conditioners and I don't like it hot". Pt asked if he could move into another room. Writer explained that before moving we needed to attempt to compromise. Pt agreed.  Writer attempted to redirect the pt to discuss his day. Pt stated, "I'm not going to take my medicine tonight because I don't have my CPAP." Stated he's not taking anything that "depresses his breathing". Pt has no questions or concerns.    A:  Support and encouragement was offered. 15 min checks continued for safety.  R: Pt remains safe.

## 2016-02-15 NOTE — Progress Notes (Signed)
[place] Group Notes:  (Nursing/MHT/Case Management/Adjunct)  Date:  02/15/2016  Time:  11:05 PM  Type of Therapy:  Psychoeducational Skills  Participation Level:  Active  Participation Quality:  Appropriate  Affect:  Appropriate  Cognitive:  Appropriate  Insight:  Appropriate  Engagement in Group:  Developing/Improving  Modes of Intervention:  Education  Summary of Progress/Problems: The patient shared with the group that he had a good day and that he was pleased with the fact that his blood sugar was checked during the day. He also stated that he enjoyed the group which focused on substance abuse.   Mahitha Hickling S 02/15/2016, 11:05 PM

## 2016-02-15 NOTE — Plan of Care (Signed)
Problem: Coping: Goal: Ability to cope will improve Outcome: Progressing Pt stated he was feeling a little better today

## 2016-02-15 NOTE — Progress Notes (Signed)
Adult Psychoeducational Group Note  Date:  02/15/2016 Time:  10:45 AM  Group Topic/Focus:  Personal Choices and Values:   The focus of this group is to help patients assess and explore the importance of values in their lives, how their values affect their decisions, how they express their values and what opposes their expression.   Participation Level:  Active  Participation Quality:  Appropriate  Affect:  Appropriate  Cognitive:  Appropriate  Insight: Appropriate  Engagement in Group:  Engaged  Modes of Intervention:  Discussion  Additional Comments:  Pt stated he is feeling good.  Pt states lifestyle and leisure changes he wants to make is to join silver sneakers group at gym instead of working out alone. Tonia Brooms D 02/15/2016, 10:45 AM

## 2016-02-15 NOTE — BHH Group Notes (Signed)
[place] Group Notes:  (Nursing/MHT/Case Management/Adjunct)  Date:  02/15/2016  Time:  9:20 AM  Type of Therapy:  Psychoeducational Skills  Participation Level:  Minimal  Participation Quality:  Appropriate  Affect:  Flat  Cognitive:  Alert  Insight:  Limited  Engagement in Group:  Limited  Modes of Intervention:  Discussion and Education  Summary of Progress/Problems: Rudie attended group and was seen looking down at his booklet. No distress noted at this time.   Jana Swartzlander E 02/15/2016, 9:20 AM

## 2016-02-15 NOTE — Progress Notes (Signed)
  D: Pt presents anxious on approach. Pt reports decreased depression today. Pt reports that he feels less agitated this morning. Pt have not had any episodes of aggression this morning or afternoon. Pt reports that he's easily agitated by noise and loud people. Pt reports that when he's agitated he usually wants to punch something. Pt denies HI today. Pt compliant with taking all scheduled meds this morning without refusing. Pt refused several meds yesterday. Pt compliant with attending groups and participates.  A: Medications reviewed with pt.  Medications administered as ordered per MD. Verbal support provided. Pt encouraged to attend groups. 15 minute checks performed for safety. R: Pt receptive to tx.

## 2016-02-16 LAB — GLUCOSE, CAPILLARY
GLUCOSE-CAPILLARY: 120 mg/dL — AB (ref 65–99)
Glucose-Capillary: 130 mg/dL — ABNORMAL HIGH (ref 65–99)

## 2016-02-16 MED ORDER — TRAZODONE HCL 50 MG PO TABS: 50.0000 mg | ORAL_TABLET | Freq: Every evening | ORAL | 0 refills | Status: DC | PRN

## 2016-02-16 MED ORDER — BENZTROPINE MESYLATE 0.5 MG PO TABS: 0.5000 mg | ORAL_TABLET | Freq: Two times a day (BID) | ORAL | 0 refills | Status: DC | PRN

## 2016-02-16 MED ORDER — OXCARBAZEPINE 300 MG PO TABS: 300.0000 mg | ORAL_TABLET | Freq: Every day | ORAL | 0 refills | Status: DC

## 2016-02-16 MED ORDER — METFORMIN HCL 1000 MG PO TABS: 2000.0000 mg | ORAL_TABLET | Freq: Every day | ORAL | Status: DC

## 2016-02-16 MED ORDER — KETOPROFEN 75 MG PO CAPS: 75.0000 mg | ORAL_CAPSULE | Freq: Two times a day (BID) | ORAL | 0 refills | Status: DC | PRN

## 2016-02-16 MED ORDER — HALOPERIDOL 2 MG PO TABS: 2.0000 mg | ORAL_TABLET | Freq: Two times a day (BID) | ORAL | 0 refills | Status: DC

## 2016-02-16 NOTE — Progress Notes (Signed)
Recreation Therapy Notes  Date:02/16/16 Time:0930 Location: [place]  Group Topic: Stress Management  Goal Area(s) Addresses:  Patient will verbalize importance of using healthy stress management.  Patient will identify positive emotions associated with healthy stress management.   Intervention: Stress Management  Activity: Guided Automotive engineer. LRT introduced the stress management technique guided imagery. LRT read script that guided patients in how to perform the technique. Patients were to follow along as LRT read the script to engage in the technique.  Education:Stress Management, Discharge Planning.   Education Outcome:Needs additional education  Clinical Observations/Feedback:Pt did not attend group.   Victorino Sparrow, LRT/CTRS         Victorino Sparrow A 02/16/2016 12:54 PM

## 2016-02-16 NOTE — BHH Suicide Risk Assessment (Addendum)
[facility] Discharge Suicide Risk Assessment   Principal Problem: <principal problem not specified> Discharge Diagnoses:  Patient Active Problem List   Diagnosis Date Noted  . Bipolar disorder ([place]) [F31.9] 02/13/2016    Total Time spent with patient: 30 minutes  Musculoskeletal: Strength & Muscle Tone: within normal limits Gait & Station: normal Patient leans: N/A  Psychiatric Specialty Exam: ROS  Blood pressure 133/75, pulse 92, temperature 98 F (36.7 C), temperature source Oral, resp. rate 16, height 6\' 4"  (1.93 m), weight (!) 144.7 kg (319 lb).Body mass index is 38.83 kg/m.  General Appearance: Casual  Eye Contact::  Good  Speech:  Clear and Coherent and Normal Rate409  Volume:  Normal  Mood:  Euthymic  Affect:  Congruent  Thought Process:  Coherent and Goal Directed  Orientation:  Full (Time, Place, and Person)  Thought Content:  Logical  Suicidal Thoughts:  No  Homicidal Thoughts:  No  Memory:  Immediate;   Fair Recent;   Fair Remote;   Fair  Judgement:  Fair  Insight:  Fair  Psychomotor Activity:  Normal  Concentration:  Fair  Recall:  [employer] of Knowledge:Fair  Language: Good  Akathisia:  No  Handed:  Right  AIMS (if indicated):     Assets:  Communication Skills Desire for Improvement Financial Resources/Insurance Housing Physical Health Social Support Transportation Vocational/Educational  Sleep:  Number of Hours: 6.5  Cognition: WNL  ADL's:  Intact   Mental Status Per Nursing Assessment::   On Admission:  Suicidal ideation indicated by patient, Suicide plan, Plan includes specific time, place, or method, Self-harm thoughts, Intention to act on suicide plan, Belief that plan would result in death, Thoughts of violence towards others, Plan to harm others, Intention to act on plan to harm others  Demographic Factors:  Male, Caucasian, Living alone and Unemployed  Loss Factors: None  Historical Factors: Prior suicide attempts and  Impulsivity  Risk Reduction Factors:   Religious beliefs about death and Positive therapeutic relationship  Continued Clinical Symptoms:  None  Cognitive Features That Contribute To Risk:  None    Suicide Risk:  Minimal: No identifiable suicidal ideation.  Patients presenting with no risk factors but with morbid ruminations; may be classified as minimal risk based on the severity of the depressive symptoms    Plan Of Care/Follow-up recommendations:  Patient will follow up with outpatient appointment, wants to make his own appointment for Individual therapy. Social had made appointment for patient with [facility] who will assess patient for ACTT   Ruffin Frederick, MD 02/16/2016, 9:58 AM

## 2016-02-16 NOTE — Discharge Summary (Signed)
Physician Discharge Summary Note  Patient:  Peter Buck is an 60 y.o., male MRN:  [id number] DOB:  January 01, 1956 Patient phone:  [phone] (home)  Patient address:   [address] [place] [address],  Total Time spent with patient: Greater than 30 minutes  Date of Admission:  02/13/2016  Date of Discharge: 0  Reason for Admission: Homicidal threats.  Principal Problem: Bipolar disorder, depressed.  Discharge Diagnoses: Patient Active Problem List   Diagnosis Date Noted  . Bipolar affective disorder, depressed ([place]) [F31.30] 02/13/2016    Priority: High   Past Psychiatric History: Bipolar affective disorder.  Past Medical History:  Past Medical History:  Diagnosis Date  . Anemia   . Arthritis   . Asthma   . Bipolar disorder ([place])   . Diabetes mellitus without complication ([place])   . GERD (gastroesophageal reflux disease)   . Hypertension   . Sleep apnea    History reviewed. No pertinent surgical history.  Family History: History reviewed. No pertinent family history.  Family Psychiatric  History: See H&P  Social History:  History  Alcohol Use No     History  Drug Use No    Social History   Social History  . Marital status: Divorced    Spouse name: N/A  . Number of children: N/A  . Years of education: N/A   Social History Main Topics  . Smoking status: Never Smoker  . Smokeless tobacco: Never Used  . Alcohol use No  . Drug use: No  . Sexual activity: Not Asked   Other Topics Concern  . None   Social History Narrative  . None   Hospital Course: " I was getting angry with my Barrister's clerk at the [facility] who was demeaning and belittling me in front of others." He reported Art therapist to the student advocate who took the instructor's side. He went to PCP's office and "vented"  to the PA that he was very frustrated with this Instructor and that he will go to [place] and buy a gun and shoot this instructor  and wait for police to come and kill him. Patient does not have or able to buy firearm due to his disorder. PA recommended patient voluntarily admit self to hospital and when he refused police was called who initially let him go but a petition was taken out on him and police came back and took him to [facility] who transferred him here.  Upon his arrival & admision to the [facility] adult unit, Delyle was evaluated & his presenting symptoms identified. The medication management for the presenting symptoms were discussed & initiated targeting those symptoms. He was enrolled in the group counseling sessions & encouraged to participate in the unit programming. His other pre-existing medical problems were identified & his home medications restarted accordingly.   Peter Buck was evaluated on daily basis by the clinical providers to assure his response to his treatment regimen.As his treatment progressed,  improvement was noted as evidenced by his report of decreasing symptoms. He was encouraged to update his providers on his progress by daily completion of a self inventory assessment, noting mood, mental status, any new symptoms, anxiety & or concerns.  Edwards's symptoms responded well to his treatment regimen combined with a therapeutic and supportive environment. He was motivated for recovery as evidenced by a positive/appropriate behavior and his interaction with the staff & fellow patients.He also worked closely with the treatment team & case manager to develop a discharge plan  with appropriate goals to maintain mood stability after discharge. Coping skills, problem solving as well as relaxation therapies were also part of his unit programming.  Upon discharge, Peter Buck was in much improved condition than upon admission.His symptoms were reported as significantly decreased or resolved completely. He adamantly denies any SIHI,  AVH, delusional thoughts & or paranoia. He was motivated to continue taking  medication with a goal of continued improvement in mental health. He will continue psychiatric care on an outpatient basis as noted below. He is provided with all the necessary information required to make this appointment without problems. He was medicated & discharged on; Benztropine 0.5 mg for prevention of EPS, Haldol 2 mg for mood control, Trileptal 300 mg for mood stabilization & Trazodone 50 mg for insomnia. He left [facility] with all personal belongings in no apparent distress. Transportation per [employer]  Physical Findings: AIMS: Facial and Oral Movements Muscles of Facial Expression: None, normal Lips and Perioral Area: None, normal Jaw: None, normal Tongue: None, normal,Extremity Movements Upper (arms, wrists, hands, fingers): None, normal Lower (legs, knees, ankles, toes): None, normal, Trunk Movements Neck, shoulders, hips: None, normal, Overall Severity Severity of abnormal movements (highest score from questions above): None, normal Incapacitation due to abnormal movements: None, normal Patient's awareness of abnormal movements (rate only patient's report): No Awareness, Dental Status Current problems with teeth and/or dentures?: No Does patient usually wear dentures?: No  CIWA:    COWS:     Musculoskeletal: Strength & Muscle Tone: within normal limits Gait & Station: normal Patient leans: N/A  Psychiatric Specialty Exam: Physical Exam  Constitutional: He appears well-developed.  HENT:  Head: Normocephalic.  Eyes: Pupils are equal, round, and reactive to light.  Neck: Normal range of motion.  Cardiovascular: Normal rate.   Respiratory: Effort normal.  GI: Soft.  Genitourinary:  Genitourinary Comments: Denies any issues in this area  Musculoskeletal: Normal range of motion.  Neurological: He is alert.  Skin: Skin is warm.    Review of Systems  Constitutional: Negative.   HENT: Negative.   Eyes: Negative.   Respiratory: Negative.    Cardiovascular: Negative.   Gastrointestinal: Negative.   Genitourinary: Negative.   Musculoskeletal: Negative.   Skin: Negative.   Neurological: Negative.   Endo/Heme/Allergies: Negative.   Psychiatric/Behavioral: Positive for depression (stable). Negative for hallucinations, memory loss, substance abuse and suicidal ideas. The patient has insomnia (Stable). The patient is not nervous/anxious.     Blood pressure 133/75, pulse 92, temperature 98 F (36.7 C), temperature source Oral, resp. rate 16, height 6\' 4"  (1.93 m), weight (!) 144.7 kg (319 lb).Body mass index is 38.83 kg/m.  See Md's SRA   Have you used any form of tobacco in the last 30 days? (Cigarettes, Smokeless Tobacco, Cigars, and/or Pipes): No  Has this patient used any form of tobacco in the last 30 days? (Cigarettes, Smokeless Tobacco, Cigars, and/or Pipes) Yes, No  Blood Alcohol level:  No results found for: [facility]  Metabolic Disorder Labs:  No results found for: HGBA1C, MPG No results found for: PROLACTIN No results found for: CHOL, TRIG, HDL, CHOLHDL, VLDL, LDLCALC  See Psychiatric Specialty Exam and Suicide Risk Assessment completed by Attending Physician prior to discharge.  Discharge destination:  Home  Is patient on multiple antipsychotic therapies at discharge:  No   Has Patient had three or more failed trials of antipsychotic monotherapy by history:  No  Recommended Plan for Multiple Antipsychotic Therapies: NA    Medication List  STOP taking these medications   cetirizine 10 MG tablet Commonly known as:  ZYRTEC   diclofenac 75 MG EC tablet Commonly known as:  VOLTAREN Replaced by:  ketoprofen 75 MG capsule   metFORMIN 500 MG 24 hr tablet Commonly known as:  GLUCOPHAGE-XR Replaced by:  metFORMIN 1000 MG tablet   mometasone 50 MCG/ACT nasal spray Commonly known as:  NASONEX   PROAIR HFA 108 (90 Base) MCG/ACT inhaler Generic drug:  albuterol   rosuvastatin 5 MG tablet Commonly known as:   CRESTOR   traMADol 50 MG tablet Commonly known as:  ULTRAM     TAKE these medications     Indication  benztropine 0.5 MG tablet Commonly known as:  COGENTIN Take 1 tablet (0.5 mg total) by mouth 2 (two) times daily as needed for tremors.  Indication:  Extrapyramidal Reaction caused by Medications   haloperidol 2 MG tablet Commonly known as:  HALDOL Take 1 tablet (2 mg total) by mouth 2 (two) times daily. For mood control What changed:  additional instructions  Indication:  Mood control   ketoprofen 75 MG capsule Commonly known as:  ORUDIS Take 1 capsule (75 mg total) by mouth 2 (two) times daily between meals as needed. For arthritis Replaces:  diclofenac 75 MG EC tablet  Indication:  Joint Damage causing Pain and Loss of Function   metFORMIN 1000 MG tablet Commonly known as:  GLUCOPHAGE Take 2 tablets (2,000 mg total) by mouth daily with breakfast. Replaces:  metFORMIN 500 MG 24 hr tablet  Indication:  Type 2 Diabetes   Oxcarbazepine 300 MG tablet Commonly known as:  TRILEPTAL Take 1 tablet (300 mg total) by mouth daily. For mood stabilization What changed:  when to take this  additional instructions  Indication:  Mood stabilization   traZODone 50 MG tablet Commonly known as:  DESYREL Take 1 tablet (50 mg total) by mouth at bedtime as needed for sleep.  Indication:  Trouble Sleeping      Follow-up Information    [facility] Follow up on 02/21/2016.   Why:  Hospital follow up appointment is on Wednesday Sept. 6th at 10:30am.  Contact information: [address], [place], [place] [address] Phone: [phone] Fax: [phone]         Follow-up recommendations: Activity:  As tolerated Diet: As recommended by your primary care doctor. Keep all scheduled follow-up appointments as recommended.   Comments: Patient is instructed prior to discharge to: Take all medications as prescribed by his/her mental healthcare provider. Report any adverse effects and or reactions  from the medicines to his/her outpatient provider promptly. Patient has been instructed & cautioned: To not engage in alcohol and or illegal drug use while on prescription medicines. In the event of worsening symptoms, patient is instructed to call the crisis hotline, 911 and or go to the nearest ED for appropriate evaluation and treatment of symptoms. To follow-up with his/her primary care provider for your other medical issues, concerns and or health care needs.   Signed: Encarnacion Slates, NP, PMHNP, FNP-BC 02/18/2016, 3:53 PM

## 2016-02-16 NOTE — Progress Notes (Signed)
Data. Patient denies SI/HI/AVH. Patient interacting well with staff and other patients. His affect is bright and he has been noted smiling often. On his self assessment he reports 0/10 for depression and hopelessness and 1/10 for anxiety. His goal for today is, "Discharge planning".  Action. Emotional support and encouragement offered. Education provided on medication, indications and side effect. Q 15 minute checks done for safety. Response. Safety on the unit maintained through 15 minute checks.  Medications taken as prescribed. Attended groups. Remained calm and appropriate through out shift.  Pt. discharged to lobby, to be picked up by "Melburn Popper". Belongings sheet reviewed and signed by pt. and all belongings, including scripts, sent home. Paperwork reviewed and pt. able to verbalize understanding of education. Pt. in no current distress and ambulatory.

## 2016-02-16 NOTE — Progress Notes (Signed)
  [facility] Adult Case Management Discharge Plan :  Will you be returning to the same living situation after discharge:  Yes,  home At discharge, do you have transportation home?: Yes,  Pt plans to use uber.  Do you have the ability to pay for your medications: Yes,  insurance   Release of information consent forms completed and in the chart;  Patient's signature needed at discharge.  Patient to Follow up at: Follow-up Information    [facility] Follow up on 02/21/2016.   Why:  Hospital follow up appointment is on Wednesday Sept. 6th at 10:30am.  Contact information: [address], [place], [place] [address] Phone: [phone] Fax: [phone]          Next level of care provider has access to [place] and Suicide Prevention discussed: Yes,  with patient and brother   Have you used any form of tobacco in the last 30 days? (Cigarettes, Smokeless Tobacco, Cigars, and/or Pipes): No  Has patient been referred to the Quitline?: N/A patient is not a smoker  Patient has been referred for addiction treatment: [place] MSW, [place]  02/16/2016, 12:19 PM

## 2016-02-20 DIAGNOSIS — E119 Type 2 diabetes mellitus without complications: Secondary | ICD-10-CM | POA: Diagnosis not present

## 2016-02-20 DIAGNOSIS — Z7984 Long term (current) use of oral hypoglycemic drugs: Secondary | ICD-10-CM | POA: Diagnosis not present

## 2016-02-20 DIAGNOSIS — E291 Testicular hypofunction: Secondary | ICD-10-CM | POA: Diagnosis not present

## 2016-02-20 DIAGNOSIS — E1165 Type 2 diabetes mellitus with hyperglycemia: Secondary | ICD-10-CM | POA: Diagnosis not present

## 2016-02-20 DIAGNOSIS — Q984 Klinefelter syndrome, unspecified: Secondary | ICD-10-CM | POA: Diagnosis not present

## 2016-02-21 DIAGNOSIS — Z5181 Encounter for therapeutic drug level monitoring: Secondary | ICD-10-CM | POA: Diagnosis not present

## 2016-02-21 DIAGNOSIS — Z23 Encounter for immunization: Secondary | ICD-10-CM | POA: Diagnosis not present

## 2016-02-21 DIAGNOSIS — Q984 Klinefelter syndrome, unspecified: Secondary | ICD-10-CM | POA: Diagnosis not present

## 2016-02-21 DIAGNOSIS — Z8349 Family history of other endocrine, nutritional and metabolic diseases: Secondary | ICD-10-CM | POA: Diagnosis not present

## 2016-02-21 DIAGNOSIS — N62 Hypertrophy of breast: Secondary | ICD-10-CM | POA: Diagnosis not present

## 2016-02-21 DIAGNOSIS — E291 Testicular hypofunction: Secondary | ICD-10-CM | POA: Diagnosis not present

## 2016-02-21 DIAGNOSIS — E1142 Type 2 diabetes mellitus with diabetic polyneuropathy: Secondary | ICD-10-CM | POA: Diagnosis not present

## 2016-02-21 DIAGNOSIS — Z6839 Body mass index (BMI) 39.0-39.9, adult: Secondary | ICD-10-CM | POA: Diagnosis not present

## 2016-02-21 DIAGNOSIS — Z833 Family history of diabetes mellitus: Secondary | ICD-10-CM | POA: Diagnosis not present

## 2016-02-22 DIAGNOSIS — M79643 Pain in unspecified hand: Secondary | ICD-10-CM | POA: Diagnosis not present

## 2016-02-22 DIAGNOSIS — M1990 Unspecified osteoarthritis, unspecified site: Secondary | ICD-10-CM | POA: Diagnosis not present

## 2016-02-22 DIAGNOSIS — R768 Other specified abnormal immunological findings in serum: Secondary | ICD-10-CM | POA: Diagnosis not present

## 2016-02-22 DIAGNOSIS — M7580 Other shoulder lesions, unspecified shoulder: Secondary | ICD-10-CM | POA: Diagnosis not present

## 2016-02-29 DIAGNOSIS — E119 Type 2 diabetes mellitus without complications: Secondary | ICD-10-CM | POA: Diagnosis not present

## 2016-03-06 DIAGNOSIS — E291 Testicular hypofunction: Secondary | ICD-10-CM | POA: Diagnosis not present

## 2016-03-07 DIAGNOSIS — R69 Illness, unspecified: Secondary | ICD-10-CM | POA: Diagnosis not present

## 2016-03-12 DIAGNOSIS — Z6839 Body mass index (BMI) 39.0-39.9, adult: Secondary | ICD-10-CM | POA: Diagnosis not present

## 2016-03-12 DIAGNOSIS — M549 Dorsalgia, unspecified: Secondary | ICD-10-CM | POA: Diagnosis not present

## 2016-03-12 DIAGNOSIS — M79605 Pain in left leg: Secondary | ICD-10-CM | POA: Diagnosis not present

## 2016-03-20 DIAGNOSIS — M7580 Other shoulder lesions, unspecified shoulder: Secondary | ICD-10-CM | POA: Diagnosis not present

## 2016-03-20 DIAGNOSIS — M160 Bilateral primary osteoarthritis of hip: Secondary | ICD-10-CM | POA: Diagnosis not present

## 2016-03-20 DIAGNOSIS — M25552 Pain in left hip: Secondary | ICD-10-CM | POA: Diagnosis not present

## 2016-03-20 DIAGNOSIS — M79643 Pain in unspecified hand: Secondary | ICD-10-CM | POA: Diagnosis not present

## 2016-03-20 DIAGNOSIS — E291 Testicular hypofunction: Secondary | ICD-10-CM | POA: Diagnosis not present

## 2016-03-20 DIAGNOSIS — M1990 Unspecified osteoarthritis, unspecified site: Secondary | ICD-10-CM | POA: Diagnosis not present

## 2016-03-20 DIAGNOSIS — R768 Other specified abnormal immunological findings in serum: Secondary | ICD-10-CM | POA: Diagnosis not present

## 2016-03-29 DIAGNOSIS — M79605 Pain in left leg: Secondary | ICD-10-CM | POA: Diagnosis not present

## 2016-03-29 DIAGNOSIS — Z6839 Body mass index (BMI) 39.0-39.9, adult: Secondary | ICD-10-CM | POA: Diagnosis not present

## 2016-04-03 DIAGNOSIS — E291 Testicular hypofunction: Secondary | ICD-10-CM | POA: Diagnosis not present

## 2016-04-09 DIAGNOSIS — R69 Illness, unspecified: Secondary | ICD-10-CM | POA: Diagnosis not present

## 2016-04-10 DIAGNOSIS — H524 Presbyopia: Secondary | ICD-10-CM | POA: Diagnosis not present

## 2016-04-17 DIAGNOSIS — E291 Testicular hypofunction: Secondary | ICD-10-CM | POA: Diagnosis not present

## 2016-04-24 DIAGNOSIS — E291 Testicular hypofunction: Secondary | ICD-10-CM | POA: Diagnosis not present

## 2016-04-24 DIAGNOSIS — Z5181 Encounter for therapeutic drug level monitoring: Secondary | ICD-10-CM | POA: Diagnosis not present

## 2016-04-24 DIAGNOSIS — N62 Hypertrophy of breast: Secondary | ICD-10-CM | POA: Diagnosis not present

## 2016-04-24 DIAGNOSIS — Q984 Klinefelter syndrome, unspecified: Secondary | ICD-10-CM | POA: Diagnosis not present

## 2016-04-24 DIAGNOSIS — Z6839 Body mass index (BMI) 39.0-39.9, adult: Secondary | ICD-10-CM | POA: Diagnosis not present

## 2016-04-24 DIAGNOSIS — Z7984 Long term (current) use of oral hypoglycemic drugs: Secondary | ICD-10-CM | POA: Diagnosis not present

## 2016-04-24 DIAGNOSIS — E1142 Type 2 diabetes mellitus with diabetic polyneuropathy: Secondary | ICD-10-CM | POA: Diagnosis not present

## 2016-04-29 DIAGNOSIS — F603 Borderline personality disorder: Secondary | ICD-10-CM | POA: Insufficient documentation

## 2016-04-29 DIAGNOSIS — G4700 Insomnia, unspecified: Secondary | ICD-10-CM

## 2016-04-29 HISTORY — DX: Borderline personality disorder: F60.3

## 2016-04-29 HISTORY — DX: Insomnia, unspecified: G47.00

## 2016-05-02 DIAGNOSIS — M79605 Pain in left leg: Secondary | ICD-10-CM | POA: Diagnosis not present

## 2016-05-02 DIAGNOSIS — Z0000 Encounter for general adult medical examination without abnormal findings: Secondary | ICD-10-CM | POA: Diagnosis not present

## 2016-05-02 DIAGNOSIS — Z6839 Body mass index (BMI) 39.0-39.9, adult: Secondary | ICD-10-CM | POA: Diagnosis not present

## 2016-05-06 DIAGNOSIS — R69 Illness, unspecified: Secondary | ICD-10-CM | POA: Diagnosis not present

## 2016-05-15 DIAGNOSIS — E291 Testicular hypofunction: Secondary | ICD-10-CM | POA: Diagnosis not present

## 2016-05-24 DIAGNOSIS — M79605 Pain in left leg: Secondary | ICD-10-CM | POA: Diagnosis not present

## 2016-05-24 DIAGNOSIS — Z6839 Body mass index (BMI) 39.0-39.9, adult: Secondary | ICD-10-CM | POA: Diagnosis not present

## 2016-05-29 DIAGNOSIS — S63601A Unspecified sprain of right thumb, initial encounter: Secondary | ICD-10-CM | POA: Diagnosis not present

## 2016-05-29 DIAGNOSIS — Z6839 Body mass index (BMI) 39.0-39.9, adult: Secondary | ICD-10-CM | POA: Diagnosis not present

## 2016-05-31 DIAGNOSIS — R69 Illness, unspecified: Secondary | ICD-10-CM | POA: Diagnosis not present

## 2016-06-03 DIAGNOSIS — R69 Illness, unspecified: Secondary | ICD-10-CM | POA: Diagnosis not present

## 2016-06-05 DIAGNOSIS — E291 Testicular hypofunction: Secondary | ICD-10-CM | POA: Diagnosis not present

## 2016-06-26 DIAGNOSIS — E291 Testicular hypofunction: Secondary | ICD-10-CM | POA: Diagnosis not present

## 2016-06-28 DIAGNOSIS — M549 Dorsalgia, unspecified: Secondary | ICD-10-CM | POA: Diagnosis not present

## 2016-06-28 DIAGNOSIS — Z79890 Hormone replacement therapy (postmenopausal): Secondary | ICD-10-CM | POA: Diagnosis not present

## 2016-06-28 DIAGNOSIS — J40 Bronchitis, not specified as acute or chronic: Secondary | ICD-10-CM | POA: Diagnosis not present

## 2016-06-28 DIAGNOSIS — R05 Cough: Secondary | ICD-10-CM | POA: Diagnosis not present

## 2016-06-28 DIAGNOSIS — Z6837 Body mass index (BMI) 37.0-37.9, adult: Secondary | ICD-10-CM | POA: Diagnosis not present

## 2016-07-02 DIAGNOSIS — E119 Type 2 diabetes mellitus without complications: Secondary | ICD-10-CM | POA: Diagnosis not present

## 2016-07-02 DIAGNOSIS — B351 Tinea unguium: Secondary | ICD-10-CM | POA: Diagnosis not present

## 2016-07-15 DIAGNOSIS — R69 Illness, unspecified: Secondary | ICD-10-CM | POA: Diagnosis not present

## 2016-07-17 DIAGNOSIS — R69 Illness, unspecified: Secondary | ICD-10-CM | POA: Diagnosis not present

## 2016-07-17 DIAGNOSIS — E291 Testicular hypofunction: Secondary | ICD-10-CM | POA: Diagnosis not present

## 2016-07-24 DIAGNOSIS — I081 Rheumatic disorders of both mitral and tricuspid valves: Secondary | ICD-10-CM | POA: Diagnosis not present

## 2016-07-24 DIAGNOSIS — R011 Cardiac murmur, unspecified: Secondary | ICD-10-CM | POA: Diagnosis not present

## 2016-07-24 DIAGNOSIS — R001 Bradycardia, unspecified: Secondary | ICD-10-CM | POA: Diagnosis not present

## 2016-07-31 DIAGNOSIS — R69 Illness, unspecified: Secondary | ICD-10-CM | POA: Diagnosis not present

## 2016-08-05 DIAGNOSIS — R69 Illness, unspecified: Secondary | ICD-10-CM | POA: Diagnosis not present

## 2016-08-07 DIAGNOSIS — E291 Testicular hypofunction: Secondary | ICD-10-CM | POA: Diagnosis not present

## 2016-08-14 DIAGNOSIS — R69 Illness, unspecified: Secondary | ICD-10-CM | POA: Diagnosis not present

## 2016-08-19 DIAGNOSIS — R69 Illness, unspecified: Secondary | ICD-10-CM | POA: Diagnosis not present

## 2016-08-22 DIAGNOSIS — M8580 Other specified disorders of bone density and structure, unspecified site: Secondary | ICD-10-CM | POA: Diagnosis not present

## 2016-08-22 DIAGNOSIS — M19041 Primary osteoarthritis, right hand: Secondary | ICD-10-CM | POA: Diagnosis not present

## 2016-08-22 DIAGNOSIS — R768 Other specified abnormal immunological findings in serum: Secondary | ICD-10-CM | POA: Diagnosis not present

## 2016-08-22 DIAGNOSIS — M79643 Pain in unspecified hand: Secondary | ICD-10-CM | POA: Diagnosis not present

## 2016-08-22 DIAGNOSIS — M1990 Unspecified osteoarthritis, unspecified site: Secondary | ICD-10-CM | POA: Diagnosis not present

## 2016-08-22 DIAGNOSIS — M19042 Primary osteoarthritis, left hand: Secondary | ICD-10-CM | POA: Diagnosis not present

## 2016-08-27 DIAGNOSIS — E119 Type 2 diabetes mellitus without complications: Secondary | ICD-10-CM | POA: Diagnosis not present

## 2016-08-27 DIAGNOSIS — K5900 Constipation, unspecified: Secondary | ICD-10-CM | POA: Diagnosis not present

## 2016-08-27 DIAGNOSIS — Z862 Personal history of diseases of the blood and blood-forming organs and certain disorders involving the immune mechanism: Secondary | ICD-10-CM | POA: Diagnosis not present

## 2016-08-27 DIAGNOSIS — E291 Testicular hypofunction: Secondary | ICD-10-CM | POA: Diagnosis not present

## 2016-09-05 DIAGNOSIS — R69 Illness, unspecified: Secondary | ICD-10-CM | POA: Diagnosis not present

## 2016-09-10 DIAGNOSIS — R69 Illness, unspecified: Secondary | ICD-10-CM | POA: Diagnosis not present

## 2016-09-12 DIAGNOSIS — R69 Illness, unspecified: Secondary | ICD-10-CM | POA: Diagnosis not present

## 2016-09-24 DIAGNOSIS — E291 Testicular hypofunction: Secondary | ICD-10-CM | POA: Diagnosis not present

## 2016-09-26 DIAGNOSIS — Z79890 Hormone replacement therapy (postmenopausal): Secondary | ICD-10-CM | POA: Diagnosis not present

## 2016-09-26 DIAGNOSIS — E782 Mixed hyperlipidemia: Secondary | ICD-10-CM | POA: Diagnosis not present

## 2016-09-26 DIAGNOSIS — E119 Type 2 diabetes mellitus without complications: Secondary | ICD-10-CM | POA: Diagnosis not present

## 2016-10-01 DIAGNOSIS — M79674 Pain in right toe(s): Secondary | ICD-10-CM | POA: Diagnosis not present

## 2016-10-01 DIAGNOSIS — B351 Tinea unguium: Secondary | ICD-10-CM | POA: Diagnosis not present

## 2016-10-01 DIAGNOSIS — E119 Type 2 diabetes mellitus without complications: Secondary | ICD-10-CM | POA: Diagnosis not present

## 2016-10-14 DIAGNOSIS — R69 Illness, unspecified: Secondary | ICD-10-CM | POA: Diagnosis not present

## 2016-10-15 DIAGNOSIS — E291 Testicular hypofunction: Secondary | ICD-10-CM | POA: Diagnosis not present

## 2016-10-24 DIAGNOSIS — Z6839 Body mass index (BMI) 39.0-39.9, adult: Secondary | ICD-10-CM | POA: Diagnosis not present

## 2016-10-24 DIAGNOSIS — H109 Unspecified conjunctivitis: Secondary | ICD-10-CM | POA: Diagnosis not present

## 2016-10-24 DIAGNOSIS — J301 Allergic rhinitis due to pollen: Secondary | ICD-10-CM | POA: Diagnosis not present

## 2016-11-05 DIAGNOSIS — E291 Testicular hypofunction: Secondary | ICD-10-CM | POA: Diagnosis not present

## 2016-11-13 DIAGNOSIS — H01123 Discoid lupus erythematosus of right eye, unspecified eyelid: Secondary | ICD-10-CM | POA: Diagnosis not present

## 2016-11-26 DIAGNOSIS — Z79890 Hormone replacement therapy (postmenopausal): Secondary | ICD-10-CM | POA: Diagnosis not present

## 2016-12-04 DIAGNOSIS — Q984 Klinefelter syndrome, unspecified: Secondary | ICD-10-CM | POA: Diagnosis not present

## 2016-12-04 DIAGNOSIS — N62 Hypertrophy of breast: Secondary | ICD-10-CM | POA: Diagnosis not present

## 2016-12-04 DIAGNOSIS — E291 Testicular hypofunction: Secondary | ICD-10-CM | POA: Diagnosis not present

## 2016-12-04 DIAGNOSIS — Z6839 Body mass index (BMI) 39.0-39.9, adult: Secondary | ICD-10-CM | POA: Diagnosis not present

## 2016-12-04 DIAGNOSIS — Z7984 Long term (current) use of oral hypoglycemic drugs: Secondary | ICD-10-CM | POA: Diagnosis not present

## 2016-12-04 DIAGNOSIS — E1142 Type 2 diabetes mellitus with diabetic polyneuropathy: Secondary | ICD-10-CM | POA: Diagnosis not present

## 2016-12-04 DIAGNOSIS — Z5181 Encounter for therapeutic drug level monitoring: Secondary | ICD-10-CM | POA: Diagnosis not present

## 2016-12-05 DIAGNOSIS — E119 Type 2 diabetes mellitus without complications: Secondary | ICD-10-CM | POA: Diagnosis not present

## 2016-12-05 DIAGNOSIS — M19079 Primary osteoarthritis, unspecified ankle and foot: Secondary | ICD-10-CM

## 2016-12-05 DIAGNOSIS — M19072 Primary osteoarthritis, left ankle and foot: Secondary | ICD-10-CM | POA: Insufficient documentation

## 2016-12-05 HISTORY — DX: Primary osteoarthritis, unspecified ankle and foot: M19.079

## 2016-12-09 DIAGNOSIS — E1142 Type 2 diabetes mellitus with diabetic polyneuropathy: Secondary | ICD-10-CM | POA: Diagnosis not present

## 2016-12-09 DIAGNOSIS — R69 Illness, unspecified: Secondary | ICD-10-CM | POA: Diagnosis not present

## 2016-12-09 DIAGNOSIS — Z5181 Encounter for therapeutic drug level monitoring: Secondary | ICD-10-CM | POA: Diagnosis not present

## 2016-12-17 DIAGNOSIS — Z79890 Hormone replacement therapy (postmenopausal): Secondary | ICD-10-CM | POA: Diagnosis not present

## 2016-12-24 DIAGNOSIS — I10 Essential (primary) hypertension: Secondary | ICD-10-CM | POA: Diagnosis not present

## 2016-12-24 DIAGNOSIS — R69 Illness, unspecified: Secondary | ICD-10-CM | POA: Diagnosis not present

## 2016-12-24 DIAGNOSIS — M79609 Pain in unspecified limb: Secondary | ICD-10-CM | POA: Diagnosis not present

## 2016-12-24 DIAGNOSIS — Z6839 Body mass index (BMI) 39.0-39.9, adult: Secondary | ICD-10-CM | POA: Diagnosis not present

## 2016-12-24 DIAGNOSIS — E119 Type 2 diabetes mellitus without complications: Secondary | ICD-10-CM | POA: Diagnosis not present

## 2016-12-24 DIAGNOSIS — Z139 Encounter for screening, unspecified: Secondary | ICD-10-CM | POA: Diagnosis not present

## 2016-12-24 DIAGNOSIS — K5900 Constipation, unspecified: Secondary | ICD-10-CM | POA: Diagnosis not present

## 2017-01-02 DIAGNOSIS — M6702 Short Achilles tendon (acquired), left ankle: Secondary | ICD-10-CM | POA: Diagnosis not present

## 2017-01-02 DIAGNOSIS — M722 Plantar fascial fibromatosis: Secondary | ICD-10-CM

## 2017-01-02 DIAGNOSIS — M19072 Primary osteoarthritis, left ankle and foot: Secondary | ICD-10-CM | POA: Diagnosis not present

## 2017-01-02 DIAGNOSIS — E119 Type 2 diabetes mellitus without complications: Secondary | ICD-10-CM | POA: Diagnosis not present

## 2017-01-02 HISTORY — DX: Plantar fascial fibromatosis: M72.2

## 2017-01-07 DIAGNOSIS — Z79890 Hormone replacement therapy (postmenopausal): Secondary | ICD-10-CM | POA: Diagnosis not present

## 2017-01-13 DIAGNOSIS — R69 Illness, unspecified: Secondary | ICD-10-CM | POA: Diagnosis not present

## 2017-01-28 DIAGNOSIS — E782 Mixed hyperlipidemia: Secondary | ICD-10-CM | POA: Diagnosis not present

## 2017-01-28 DIAGNOSIS — E119 Type 2 diabetes mellitus without complications: Secondary | ICD-10-CM | POA: Diagnosis not present

## 2017-01-28 DIAGNOSIS — N401 Enlarged prostate with lower urinary tract symptoms: Secondary | ICD-10-CM | POA: Diagnosis not present

## 2017-01-28 DIAGNOSIS — E291 Testicular hypofunction: Secondary | ICD-10-CM | POA: Diagnosis not present

## 2017-01-28 DIAGNOSIS — Z862 Personal history of diseases of the blood and blood-forming organs and certain disorders involving the immune mechanism: Secondary | ICD-10-CM | POA: Diagnosis not present

## 2017-01-30 DIAGNOSIS — E291 Testicular hypofunction: Secondary | ICD-10-CM | POA: Diagnosis not present

## 2017-02-06 DIAGNOSIS — E119 Type 2 diabetes mellitus without complications: Secondary | ICD-10-CM | POA: Diagnosis not present

## 2017-02-06 DIAGNOSIS — E782 Mixed hyperlipidemia: Secondary | ICD-10-CM | POA: Diagnosis not present

## 2017-02-06 DIAGNOSIS — Z139 Encounter for screening, unspecified: Secondary | ICD-10-CM | POA: Diagnosis not present

## 2017-02-06 DIAGNOSIS — Z7189 Other specified counseling: Secondary | ICD-10-CM | POA: Diagnosis not present

## 2017-02-11 DIAGNOSIS — E1140 Type 2 diabetes mellitus with diabetic neuropathy, unspecified: Secondary | ICD-10-CM | POA: Diagnosis not present

## 2017-02-11 DIAGNOSIS — Z6841 Body Mass Index (BMI) 40.0 and over, adult: Secondary | ICD-10-CM | POA: Diagnosis not present

## 2017-02-20 DIAGNOSIS — E291 Testicular hypofunction: Secondary | ICD-10-CM | POA: Diagnosis not present

## 2017-02-25 DIAGNOSIS — L820 Inflamed seborrheic keratosis: Secondary | ICD-10-CM | POA: Diagnosis not present

## 2017-03-11 DIAGNOSIS — Q984 Klinefelter syndrome, unspecified: Secondary | ICD-10-CM | POA: Diagnosis not present

## 2017-03-11 DIAGNOSIS — E291 Testicular hypofunction: Secondary | ICD-10-CM | POA: Diagnosis not present

## 2017-03-11 DIAGNOSIS — E1142 Type 2 diabetes mellitus with diabetic polyneuropathy: Secondary | ICD-10-CM | POA: Diagnosis not present

## 2017-03-11 DIAGNOSIS — N62 Hypertrophy of breast: Secondary | ICD-10-CM | POA: Diagnosis not present

## 2017-03-11 DIAGNOSIS — Z5181 Encounter for therapeutic drug level monitoring: Secondary | ICD-10-CM | POA: Diagnosis not present

## 2017-03-11 DIAGNOSIS — R69 Illness, unspecified: Secondary | ICD-10-CM | POA: Diagnosis not present

## 2017-03-12 DIAGNOSIS — M1990 Unspecified osteoarthritis, unspecified site: Secondary | ICD-10-CM | POA: Diagnosis not present

## 2017-03-12 DIAGNOSIS — R768 Other specified abnormal immunological findings in serum: Secondary | ICD-10-CM | POA: Diagnosis not present

## 2017-03-12 DIAGNOSIS — M8580 Other specified disorders of bone density and structure, unspecified site: Secondary | ICD-10-CM | POA: Diagnosis not present

## 2017-03-12 DIAGNOSIS — M79643 Pain in unspecified hand: Secondary | ICD-10-CM | POA: Diagnosis not present

## 2017-03-13 DIAGNOSIS — M8580 Other specified disorders of bone density and structure, unspecified site: Secondary | ICD-10-CM | POA: Diagnosis not present

## 2017-03-13 DIAGNOSIS — Z23 Encounter for immunization: Secondary | ICD-10-CM | POA: Diagnosis not present

## 2017-03-13 DIAGNOSIS — E291 Testicular hypofunction: Secondary | ICD-10-CM | POA: Diagnosis not present

## 2017-03-13 DIAGNOSIS — M859 Disorder of bone density and structure, unspecified: Secondary | ICD-10-CM | POA: Diagnosis not present

## 2017-04-15 DIAGNOSIS — M722 Plantar fascial fibromatosis: Secondary | ICD-10-CM | POA: Diagnosis not present

## 2017-04-15 DIAGNOSIS — B351 Tinea unguium: Secondary | ICD-10-CM | POA: Diagnosis not present

## 2017-04-15 DIAGNOSIS — E119 Type 2 diabetes mellitus without complications: Secondary | ICD-10-CM | POA: Diagnosis not present

## 2017-04-15 DIAGNOSIS — M6702 Short Achilles tendon (acquired), left ankle: Secondary | ICD-10-CM | POA: Diagnosis not present

## 2017-04-16 DIAGNOSIS — H6123 Impacted cerumen, bilateral: Secondary | ICD-10-CM | POA: Diagnosis not present

## 2017-04-17 DIAGNOSIS — R42 Dizziness and giddiness: Secondary | ICD-10-CM | POA: Diagnosis not present

## 2017-04-17 DIAGNOSIS — Z6839 Body mass index (BMI) 39.0-39.9, adult: Secondary | ICD-10-CM | POA: Diagnosis not present

## 2017-04-17 DIAGNOSIS — Z139 Encounter for screening, unspecified: Secondary | ICD-10-CM | POA: Diagnosis not present

## 2017-04-24 DIAGNOSIS — H6690 Otitis media, unspecified, unspecified ear: Secondary | ICD-10-CM | POA: Diagnosis not present

## 2017-04-24 DIAGNOSIS — Z6839 Body mass index (BMI) 39.0-39.9, adult: Secondary | ICD-10-CM | POA: Diagnosis not present

## 2017-05-05 DIAGNOSIS — R69 Illness, unspecified: Secondary | ICD-10-CM | POA: Diagnosis not present

## 2017-05-06 DIAGNOSIS — R69 Illness, unspecified: Secondary | ICD-10-CM | POA: Diagnosis not present

## 2017-05-06 DIAGNOSIS — E291 Testicular hypofunction: Secondary | ICD-10-CM | POA: Diagnosis not present

## 2017-05-06 DIAGNOSIS — E782 Mixed hyperlipidemia: Secondary | ICD-10-CM | POA: Diagnosis not present

## 2017-05-06 DIAGNOSIS — Z139 Encounter for screening, unspecified: Secondary | ICD-10-CM | POA: Diagnosis not present

## 2017-05-06 DIAGNOSIS — F319 Bipolar disorder, unspecified: Secondary | ICD-10-CM | POA: Diagnosis not present

## 2017-05-06 DIAGNOSIS — E1140 Type 2 diabetes mellitus with diabetic neuropathy, unspecified: Secondary | ICD-10-CM | POA: Diagnosis not present

## 2017-05-06 DIAGNOSIS — Z9181 History of falling: Secondary | ICD-10-CM | POA: Diagnosis not present

## 2017-05-06 DIAGNOSIS — Z0000 Encounter for general adult medical examination without abnormal findings: Secondary | ICD-10-CM | POA: Diagnosis not present

## 2017-05-15 DIAGNOSIS — R69 Illness, unspecified: Secondary | ICD-10-CM | POA: Diagnosis not present

## 2017-05-15 DIAGNOSIS — E1140 Type 2 diabetes mellitus with diabetic neuropathy, unspecified: Secondary | ICD-10-CM | POA: Diagnosis not present

## 2017-05-15 DIAGNOSIS — Z6838 Body mass index (BMI) 38.0-38.9, adult: Secondary | ICD-10-CM | POA: Diagnosis not present

## 2017-05-15 DIAGNOSIS — E291 Testicular hypofunction: Secondary | ICD-10-CM | POA: Diagnosis not present

## 2017-05-15 DIAGNOSIS — E782 Mixed hyperlipidemia: Secondary | ICD-10-CM | POA: Diagnosis not present

## 2017-05-29 DIAGNOSIS — M79605 Pain in left leg: Secondary | ICD-10-CM | POA: Diagnosis not present

## 2017-05-29 DIAGNOSIS — Z6838 Body mass index (BMI) 38.0-38.9, adult: Secondary | ICD-10-CM | POA: Diagnosis not present

## 2017-05-29 DIAGNOSIS — M25542 Pain in joints of left hand: Secondary | ICD-10-CM | POA: Diagnosis not present

## 2017-06-03 DIAGNOSIS — R69 Illness, unspecified: Secondary | ICD-10-CM | POA: Diagnosis not present

## 2017-06-10 DIAGNOSIS — R109 Unspecified abdominal pain: Secondary | ICD-10-CM | POA: Diagnosis not present

## 2017-06-11 DIAGNOSIS — K529 Noninfective gastroenteritis and colitis, unspecified: Secondary | ICD-10-CM | POA: Diagnosis not present

## 2017-06-11 DIAGNOSIS — Z6837 Body mass index (BMI) 37.0-37.9, adult: Secondary | ICD-10-CM | POA: Diagnosis not present

## 2017-06-11 DIAGNOSIS — Z9189 Other specified personal risk factors, not elsewhere classified: Secondary | ICD-10-CM | POA: Diagnosis not present

## 2017-06-19 DIAGNOSIS — Z6838 Body mass index (BMI) 38.0-38.9, adult: Secondary | ICD-10-CM | POA: Diagnosis not present

## 2017-06-19 DIAGNOSIS — M549 Dorsalgia, unspecified: Secondary | ICD-10-CM | POA: Diagnosis not present

## 2017-06-19 DIAGNOSIS — M62830 Muscle spasm of back: Secondary | ICD-10-CM | POA: Diagnosis not present

## 2017-06-25 DIAGNOSIS — M810 Age-related osteoporosis without current pathological fracture: Secondary | ICD-10-CM | POA: Diagnosis not present

## 2017-06-26 DIAGNOSIS — K5909 Other constipation: Secondary | ICD-10-CM | POA: Diagnosis not present

## 2017-06-26 DIAGNOSIS — Z6837 Body mass index (BMI) 37.0-37.9, adult: Secondary | ICD-10-CM | POA: Diagnosis not present

## 2017-07-03 DIAGNOSIS — E1140 Type 2 diabetes mellitus with diabetic neuropathy, unspecified: Secondary | ICD-10-CM | POA: Diagnosis not present

## 2017-07-03 DIAGNOSIS — M79671 Pain in right foot: Secondary | ICD-10-CM | POA: Diagnosis not present

## 2017-07-03 DIAGNOSIS — Z6837 Body mass index (BMI) 37.0-37.9, adult: Secondary | ICD-10-CM | POA: Diagnosis not present

## 2017-07-10 DIAGNOSIS — E1140 Type 2 diabetes mellitus with diabetic neuropathy, unspecified: Secondary | ICD-10-CM | POA: Diagnosis not present

## 2017-07-10 DIAGNOSIS — Z6837 Body mass index (BMI) 37.0-37.9, adult: Secondary | ICD-10-CM | POA: Diagnosis not present

## 2017-07-10 DIAGNOSIS — M79673 Pain in unspecified foot: Secondary | ICD-10-CM | POA: Diagnosis not present

## 2017-07-10 DIAGNOSIS — G8929 Other chronic pain: Secondary | ICD-10-CM | POA: Diagnosis not present

## 2017-07-10 DIAGNOSIS — M25552 Pain in left hip: Secondary | ICD-10-CM | POA: Diagnosis not present

## 2017-07-13 DIAGNOSIS — M549 Dorsalgia, unspecified: Secondary | ICD-10-CM | POA: Diagnosis not present

## 2017-07-13 DIAGNOSIS — E785 Hyperlipidemia, unspecified: Secondary | ICD-10-CM | POA: Diagnosis not present

## 2017-07-13 DIAGNOSIS — M4726 Other spondylosis with radiculopathy, lumbar region: Secondary | ICD-10-CM | POA: Diagnosis not present

## 2017-07-13 DIAGNOSIS — Z880 Allergy status to penicillin: Secondary | ICD-10-CM | POA: Diagnosis not present

## 2017-07-13 DIAGNOSIS — M4724 Other spondylosis with radiculopathy, thoracic region: Secondary | ICD-10-CM | POA: Diagnosis not present

## 2017-07-13 DIAGNOSIS — M4727 Other spondylosis with radiculopathy, lumbosacral region: Secondary | ICD-10-CM | POA: Diagnosis not present

## 2017-07-13 DIAGNOSIS — E119 Type 2 diabetes mellitus without complications: Secondary | ICD-10-CM | POA: Diagnosis not present

## 2017-07-13 DIAGNOSIS — R338 Other retention of urine: Secondary | ICD-10-CM | POA: Diagnosis not present

## 2017-07-13 DIAGNOSIS — R339 Retention of urine, unspecified: Secondary | ICD-10-CM | POA: Diagnosis not present

## 2017-07-13 DIAGNOSIS — Z888 Allergy status to other drugs, medicaments and biological substances status: Secondary | ICD-10-CM | POA: Diagnosis not present

## 2017-07-13 DIAGNOSIS — M47814 Spondylosis without myelopathy or radiculopathy, thoracic region: Secondary | ICD-10-CM | POA: Diagnosis not present

## 2017-07-13 DIAGNOSIS — M5117 Intervertebral disc disorders with radiculopathy, lumbosacral region: Secondary | ICD-10-CM | POA: Diagnosis not present

## 2017-07-13 DIAGNOSIS — J45909 Unspecified asthma, uncomplicated: Secondary | ICD-10-CM | POA: Diagnosis not present

## 2017-07-13 DIAGNOSIS — R32 Unspecified urinary incontinence: Secondary | ICD-10-CM | POA: Diagnosis not present

## 2017-07-13 DIAGNOSIS — M4804 Spinal stenosis, thoracic region: Secondary | ICD-10-CM | POA: Diagnosis not present

## 2017-07-13 DIAGNOSIS — R269 Unspecified abnormalities of gait and mobility: Secondary | ICD-10-CM | POA: Diagnosis not present

## 2017-07-13 DIAGNOSIS — M5114 Intervertebral disc disorders with radiculopathy, thoracic region: Secondary | ICD-10-CM | POA: Diagnosis not present

## 2017-07-13 DIAGNOSIS — M5116 Intervertebral disc disorders with radiculopathy, lumbar region: Secondary | ICD-10-CM | POA: Diagnosis not present

## 2017-07-13 DIAGNOSIS — M5442 Lumbago with sciatica, left side: Secondary | ICD-10-CM | POA: Diagnosis not present

## 2017-07-13 DIAGNOSIS — M5430 Sciatica, unspecified side: Secondary | ICD-10-CM | POA: Diagnosis not present

## 2017-07-13 DIAGNOSIS — M47816 Spondylosis without myelopathy or radiculopathy, lumbar region: Secondary | ICD-10-CM | POA: Diagnosis not present

## 2017-07-15 DIAGNOSIS — Z6836 Body mass index (BMI) 36.0-36.9, adult: Secondary | ICD-10-CM | POA: Diagnosis not present

## 2017-07-15 DIAGNOSIS — M549 Dorsalgia, unspecified: Secondary | ICD-10-CM | POA: Diagnosis not present

## 2017-07-15 DIAGNOSIS — Z7189 Other specified counseling: Secondary | ICD-10-CM | POA: Diagnosis not present

## 2017-07-24 DIAGNOSIS — Z6837 Body mass index (BMI) 37.0-37.9, adult: Secondary | ICD-10-CM | POA: Diagnosis not present

## 2017-07-24 DIAGNOSIS — M549 Dorsalgia, unspecified: Secondary | ICD-10-CM | POA: Diagnosis not present

## 2017-07-24 DIAGNOSIS — K5909 Other constipation: Secondary | ICD-10-CM | POA: Diagnosis not present

## 2017-07-28 DIAGNOSIS — S336XXA Sprain of sacroiliac joint, initial encounter: Secondary | ICD-10-CM | POA: Diagnosis not present

## 2017-07-28 DIAGNOSIS — M9905 Segmental and somatic dysfunction of pelvic region: Secondary | ICD-10-CM | POA: Diagnosis not present

## 2017-07-28 DIAGNOSIS — M9902 Segmental and somatic dysfunction of thoracic region: Secondary | ICD-10-CM | POA: Diagnosis not present

## 2017-07-28 DIAGNOSIS — M25551 Pain in right hip: Secondary | ICD-10-CM | POA: Diagnosis not present

## 2017-07-28 DIAGNOSIS — M9906 Segmental and somatic dysfunction of lower extremity: Secondary | ICD-10-CM | POA: Diagnosis not present

## 2017-07-28 DIAGNOSIS — M9903 Segmental and somatic dysfunction of lumbar region: Secondary | ICD-10-CM | POA: Diagnosis not present

## 2017-07-29 DIAGNOSIS — M25552 Pain in left hip: Secondary | ICD-10-CM | POA: Diagnosis not present

## 2017-07-31 DIAGNOSIS — M25552 Pain in left hip: Secondary | ICD-10-CM | POA: Diagnosis not present

## 2017-07-31 DIAGNOSIS — M5416 Radiculopathy, lumbar region: Secondary | ICD-10-CM | POA: Diagnosis not present

## 2017-08-04 DIAGNOSIS — E291 Testicular hypofunction: Secondary | ICD-10-CM | POA: Diagnosis not present

## 2017-08-04 DIAGNOSIS — M25562 Pain in left knee: Secondary | ICD-10-CM | POA: Diagnosis not present

## 2017-08-04 DIAGNOSIS — M1712 Unilateral primary osteoarthritis, left knee: Secondary | ICD-10-CM | POA: Diagnosis not present

## 2017-08-06 DIAGNOSIS — M5416 Radiculopathy, lumbar region: Secondary | ICD-10-CM | POA: Diagnosis not present

## 2017-08-11 DIAGNOSIS — M5126 Other intervertebral disc displacement, lumbar region: Secondary | ICD-10-CM | POA: Diagnosis not present

## 2017-08-11 DIAGNOSIS — M5416 Radiculopathy, lumbar region: Secondary | ICD-10-CM | POA: Diagnosis not present

## 2017-08-11 DIAGNOSIS — M48061 Spinal stenosis, lumbar region without neurogenic claudication: Secondary | ICD-10-CM | POA: Diagnosis not present

## 2017-08-13 DIAGNOSIS — M1712 Unilateral primary osteoarthritis, left knee: Secondary | ICD-10-CM | POA: Diagnosis not present

## 2017-08-21 DIAGNOSIS — M5416 Radiculopathy, lumbar region: Secondary | ICD-10-CM | POA: Diagnosis not present

## 2017-08-27 DIAGNOSIS — E291 Testicular hypofunction: Secondary | ICD-10-CM | POA: Diagnosis not present

## 2017-08-27 DIAGNOSIS — Z789 Other specified health status: Secondary | ICD-10-CM | POA: Diagnosis not present

## 2017-08-28 DIAGNOSIS — R69 Illness, unspecified: Secondary | ICD-10-CM | POA: Diagnosis not present

## 2017-09-01 DIAGNOSIS — E782 Mixed hyperlipidemia: Secondary | ICD-10-CM | POA: Diagnosis not present

## 2017-09-01 DIAGNOSIS — E1140 Type 2 diabetes mellitus with diabetic neuropathy, unspecified: Secondary | ICD-10-CM | POA: Diagnosis not present

## 2017-09-09 DIAGNOSIS — E782 Mixed hyperlipidemia: Secondary | ICD-10-CM | POA: Diagnosis not present

## 2017-09-09 DIAGNOSIS — E291 Testicular hypofunction: Secondary | ICD-10-CM | POA: Diagnosis not present

## 2017-09-09 DIAGNOSIS — Z79890 Hormone replacement therapy (postmenopausal): Secondary | ICD-10-CM | POA: Diagnosis not present

## 2017-09-09 DIAGNOSIS — E1140 Type 2 diabetes mellitus with diabetic neuropathy, unspecified: Secondary | ICD-10-CM | POA: Diagnosis not present

## 2017-09-10 DIAGNOSIS — R030 Elevated blood-pressure reading, without diagnosis of hypertension: Secondary | ICD-10-CM | POA: Diagnosis not present

## 2017-09-10 DIAGNOSIS — M5416 Radiculopathy, lumbar region: Secondary | ICD-10-CM | POA: Diagnosis not present

## 2017-09-10 DIAGNOSIS — Z6839 Body mass index (BMI) 39.0-39.9, adult: Secondary | ICD-10-CM | POA: Diagnosis not present

## 2017-09-11 ENCOUNTER — Other Ambulatory Visit: Payer: Self-pay | Admitting: Neurosurgery

## 2017-09-11 DIAGNOSIS — Z6838 Body mass index (BMI) 38.0-38.9, adult: Secondary | ICD-10-CM | POA: Diagnosis not present

## 2017-09-11 DIAGNOSIS — R030 Elevated blood-pressure reading, without diagnosis of hypertension: Secondary | ICD-10-CM | POA: Diagnosis not present

## 2017-09-11 DIAGNOSIS — M5416 Radiculopathy, lumbar region: Secondary | ICD-10-CM

## 2017-09-16 DIAGNOSIS — Z6838 Body mass index (BMI) 38.0-38.9, adult: Secondary | ICD-10-CM | POA: Insufficient documentation

## 2017-09-16 DIAGNOSIS — R69 Illness, unspecified: Secondary | ICD-10-CM | POA: Diagnosis not present

## 2017-09-16 HISTORY — DX: Body mass index (BMI) 38.0-38.9, adult: Z68.38

## 2017-09-17 DIAGNOSIS — E291 Testicular hypofunction: Secondary | ICD-10-CM | POA: Diagnosis not present

## 2017-09-22 ENCOUNTER — Ambulatory Visit
Admission: RE | Admit: 2017-09-22 | Discharge: 2017-09-22 | Disposition: A | Discharge status: Home / Self Care | Payer: Medicare HMO | Source: Ambulatory Visit | Attending: Neurosurgery | Admitting: Neurosurgery

## 2017-09-22 DIAGNOSIS — M5416 Radiculopathy, lumbar region: Secondary | ICD-10-CM | POA: Diagnosis not present

## 2017-09-22 MED ORDER — IOPAMIDOL (ISOVUE-M 200) INJECTION 41%
1.0000 mL | Freq: Once | INTRAMUSCULAR | Status: AC
  Administered 2017-09-22: 1 mL via EPIDURAL

## 2017-09-22 MED ORDER — METHYLPREDNISOLONE ACETATE 40 MG/ML INJ SUSP (RADIOLOG
120.0000 mg | Freq: Once | INTRAMUSCULAR | Status: AC
  Administered 2017-09-22: 120 mg via EPIDURAL

## 2017-09-22 NOTE — Discharge Instructions (Signed)

## 2017-09-26 DIAGNOSIS — L300 Nummular dermatitis: Secondary | ICD-10-CM | POA: Diagnosis not present

## 2017-09-26 DIAGNOSIS — L219 Seborrheic dermatitis, unspecified: Secondary | ICD-10-CM | POA: Diagnosis not present

## 2017-09-26 DIAGNOSIS — T7840XA Allergy, unspecified, initial encounter: Secondary | ICD-10-CM | POA: Diagnosis not present

## 2017-09-26 DIAGNOSIS — L270 Generalized skin eruption due to drugs and medicaments taken internally: Secondary | ICD-10-CM | POA: Diagnosis not present

## 2017-10-06 DIAGNOSIS — Z79890 Hormone replacement therapy (postmenopausal): Secondary | ICD-10-CM | POA: Diagnosis not present

## 2017-10-08 DIAGNOSIS — M79643 Pain in unspecified hand: Secondary | ICD-10-CM | POA: Diagnosis not present

## 2017-10-08 DIAGNOSIS — M8580 Other specified disorders of bone density and structure, unspecified site: Secondary | ICD-10-CM | POA: Diagnosis not present

## 2017-10-08 DIAGNOSIS — M1990 Unspecified osteoarthritis, unspecified site: Secondary | ICD-10-CM | POA: Diagnosis not present

## 2017-10-08 DIAGNOSIS — R768 Other specified abnormal immunological findings in serum: Secondary | ICD-10-CM | POA: Diagnosis not present

## 2017-10-24 DIAGNOSIS — S61012A Laceration without foreign body of left thumb without damage to nail, initial encounter: Secondary | ICD-10-CM | POA: Diagnosis not present

## 2017-10-24 DIAGNOSIS — S60352A Superficial foreign body of left thumb, initial encounter: Secondary | ICD-10-CM | POA: Diagnosis not present

## 2017-10-29 DIAGNOSIS — E291 Testicular hypofunction: Secondary | ICD-10-CM | POA: Diagnosis not present

## 2017-10-29 DIAGNOSIS — Z79890 Hormone replacement therapy (postmenopausal): Secondary | ICD-10-CM | POA: Diagnosis not present

## 2017-11-04 DIAGNOSIS — M722 Plantar fascial fibromatosis: Secondary | ICD-10-CM | POA: Diagnosis not present

## 2017-11-04 DIAGNOSIS — B351 Tinea unguium: Secondary | ICD-10-CM | POA: Diagnosis not present

## 2017-11-04 DIAGNOSIS — E119 Type 2 diabetes mellitus without complications: Secondary | ICD-10-CM | POA: Diagnosis not present

## 2017-11-05 ENCOUNTER — Other Ambulatory Visit: Payer: Self-pay | Admitting: Neurosurgery

## 2017-11-05 DIAGNOSIS — M5416 Radiculopathy, lumbar region: Secondary | ICD-10-CM

## 2017-11-06 DIAGNOSIS — Z79899 Other long term (current) drug therapy: Secondary | ICD-10-CM | POA: Diagnosis not present

## 2017-11-06 DIAGNOSIS — R0602 Shortness of breath: Secondary | ICD-10-CM | POA: Diagnosis not present

## 2017-11-06 DIAGNOSIS — T398X5A Adverse effect of other nonopioid analgesics and antipyretics, not elsewhere classified, initial encounter: Secondary | ICD-10-CM | POA: Diagnosis not present

## 2017-11-06 DIAGNOSIS — Z7984 Long term (current) use of oral hypoglycemic drugs: Secondary | ICD-10-CM | POA: Diagnosis not present

## 2017-11-06 DIAGNOSIS — J45909 Unspecified asthma, uncomplicated: Secondary | ICD-10-CM | POA: Diagnosis not present

## 2017-11-06 DIAGNOSIS — M549 Dorsalgia, unspecified: Secondary | ICD-10-CM | POA: Diagnosis not present

## 2017-11-06 DIAGNOSIS — X58XXXA Exposure to other specified factors, initial encounter: Secondary | ICD-10-CM | POA: Diagnosis not present

## 2017-11-06 DIAGNOSIS — M5416 Radiculopathy, lumbar region: Secondary | ICD-10-CM | POA: Diagnosis not present

## 2017-11-06 DIAGNOSIS — I10 Essential (primary) hypertension: Secondary | ICD-10-CM | POA: Diagnosis not present

## 2017-11-06 DIAGNOSIS — M9983 Other biomechanical lesions of lumbar region: Secondary | ICD-10-CM | POA: Diagnosis not present

## 2017-11-06 DIAGNOSIS — K5909 Other constipation: Secondary | ICD-10-CM | POA: Diagnosis not present

## 2017-11-06 DIAGNOSIS — T7840XA Allergy, unspecified, initial encounter: Secondary | ICD-10-CM | POA: Diagnosis not present

## 2017-11-13 DIAGNOSIS — T7840XA Allergy, unspecified, initial encounter: Secondary | ICD-10-CM | POA: Diagnosis not present

## 2017-11-14 DIAGNOSIS — E291 Testicular hypofunction: Secondary | ICD-10-CM | POA: Diagnosis not present

## 2017-11-14 DIAGNOSIS — M549 Dorsalgia, unspecified: Secondary | ICD-10-CM | POA: Diagnosis not present

## 2017-11-14 DIAGNOSIS — Z862 Personal history of diseases of the blood and blood-forming organs and certain disorders involving the immune mechanism: Secondary | ICD-10-CM | POA: Diagnosis not present

## 2017-11-14 DIAGNOSIS — M79643 Pain in unspecified hand: Secondary | ICD-10-CM | POA: Diagnosis not present

## 2017-11-14 DIAGNOSIS — M8580 Other specified disorders of bone density and structure, unspecified site: Secondary | ICD-10-CM | POA: Diagnosis not present

## 2017-11-14 DIAGNOSIS — R768 Other specified abnormal immunological findings in serum: Secondary | ICD-10-CM | POA: Diagnosis not present

## 2017-11-14 DIAGNOSIS — M19049 Primary osteoarthritis, unspecified hand: Secondary | ICD-10-CM | POA: Diagnosis not present

## 2017-11-14 DIAGNOSIS — M1990 Unspecified osteoarthritis, unspecified site: Secondary | ICD-10-CM | POA: Diagnosis not present

## 2017-11-17 ENCOUNTER — Ambulatory Visit
Admission: RE | Admit: 2017-11-17 | Discharge: 2017-11-17 | Disposition: A | Discharge status: Home / Self Care | Payer: Medicare HMO | Source: Ambulatory Visit | Attending: Neurosurgery | Admitting: Neurosurgery

## 2017-11-17 DIAGNOSIS — M5416 Radiculopathy, lumbar region: Secondary | ICD-10-CM

## 2017-11-17 DIAGNOSIS — M47817 Spondylosis without myelopathy or radiculopathy, lumbosacral region: Secondary | ICD-10-CM | POA: Diagnosis not present

## 2017-11-17 MED ORDER — METHYLPREDNISOLONE ACETATE 40 MG/ML INJ SUSP (RADIOLOG
120.0000 mg | Freq: Once | INTRAMUSCULAR | Status: AC
  Administered 2017-11-17: 120 mg via EPIDURAL

## 2017-11-17 MED ORDER — IOPAMIDOL (ISOVUE-M 200) INJECTION 41%: 1.0000 mL | Freq: Once | INTRAMUSCULAR | Status: DC

## 2017-11-17 NOTE — Discharge Instructions (Signed)

## 2017-11-21 DIAGNOSIS — E291 Testicular hypofunction: Secondary | ICD-10-CM | POA: Diagnosis not present

## 2017-11-25 DIAGNOSIS — M25552 Pain in left hip: Secondary | ICD-10-CM | POA: Diagnosis not present

## 2017-11-25 DIAGNOSIS — G8929 Other chronic pain: Secondary | ICD-10-CM | POA: Diagnosis not present

## 2017-11-25 DIAGNOSIS — Z79891 Long term (current) use of opiate analgesic: Secondary | ICD-10-CM | POA: Diagnosis not present

## 2017-11-25 DIAGNOSIS — M25562 Pain in left knee: Secondary | ICD-10-CM | POA: Diagnosis not present

## 2017-11-25 DIAGNOSIS — M5136 Other intervertebral disc degeneration, lumbar region: Secondary | ICD-10-CM

## 2017-11-25 DIAGNOSIS — M545 Low back pain, unspecified: Secondary | ICD-10-CM

## 2017-11-25 DIAGNOSIS — M51369 Other intervertebral disc degeneration, lumbar region without mention of lumbar back pain or lower extremity pain: Secondary | ICD-10-CM | POA: Insufficient documentation

## 2017-11-25 HISTORY — DX: Low back pain, unspecified: M54.50

## 2017-11-25 HISTORY — DX: Other intervertebral disc degeneration, lumbar region: M51.36

## 2017-11-30 DIAGNOSIS — R69 Illness, unspecified: Secondary | ICD-10-CM | POA: Diagnosis not present

## 2017-11-30 DIAGNOSIS — I10 Essential (primary) hypertension: Secondary | ICD-10-CM | POA: Diagnosis not present

## 2017-11-30 DIAGNOSIS — M2669 Other specified disorders of temporomandibular joint: Secondary | ICD-10-CM | POA: Diagnosis not present

## 2017-11-30 DIAGNOSIS — E7800 Pure hypercholesterolemia, unspecified: Secondary | ICD-10-CM | POA: Diagnosis not present

## 2017-11-30 DIAGNOSIS — M26609 Unspecified temporomandibular joint disorder, unspecified side: Secondary | ICD-10-CM | POA: Diagnosis not present

## 2017-12-04 DIAGNOSIS — E291 Testicular hypofunction: Secondary | ICD-10-CM | POA: Diagnosis not present

## 2017-12-04 DIAGNOSIS — E782 Mixed hyperlipidemia: Secondary | ICD-10-CM | POA: Diagnosis not present

## 2017-12-04 DIAGNOSIS — E1140 Type 2 diabetes mellitus with diabetic neuropathy, unspecified: Secondary | ICD-10-CM | POA: Diagnosis not present

## 2017-12-12 DIAGNOSIS — Z79890 Hormone replacement therapy (postmenopausal): Secondary | ICD-10-CM | POA: Diagnosis not present

## 2017-12-16 DIAGNOSIS — R69 Illness, unspecified: Secondary | ICD-10-CM | POA: Diagnosis not present

## 2017-12-16 DIAGNOSIS — E782 Mixed hyperlipidemia: Secondary | ICD-10-CM | POA: Diagnosis not present

## 2017-12-23 DIAGNOSIS — Z9989 Dependence on other enabling machines and devices: Secondary | ICD-10-CM | POA: Diagnosis not present

## 2017-12-23 DIAGNOSIS — G4733 Obstructive sleep apnea (adult) (pediatric): Secondary | ICD-10-CM | POA: Diagnosis not present

## 2017-12-23 DIAGNOSIS — E1140 Type 2 diabetes mellitus with diabetic neuropathy, unspecified: Secondary | ICD-10-CM | POA: Diagnosis not present

## 2017-12-23 DIAGNOSIS — R030 Elevated blood-pressure reading, without diagnosis of hypertension: Secondary | ICD-10-CM | POA: Diagnosis not present

## 2018-01-07 DIAGNOSIS — Z79890 Hormone replacement therapy (postmenopausal): Secondary | ICD-10-CM | POA: Diagnosis not present

## 2018-01-20 DIAGNOSIS — M62830 Muscle spasm of back: Secondary | ICD-10-CM | POA: Diagnosis not present

## 2018-01-20 DIAGNOSIS — G8929 Other chronic pain: Secondary | ICD-10-CM | POA: Diagnosis not present

## 2018-01-20 DIAGNOSIS — M5136 Other intervertebral disc degeneration, lumbar region: Secondary | ICD-10-CM | POA: Diagnosis not present

## 2018-01-20 DIAGNOSIS — M791 Myalgia, unspecified site: Secondary | ICD-10-CM | POA: Diagnosis not present

## 2018-01-28 DIAGNOSIS — E291 Testicular hypofunction: Secondary | ICD-10-CM | POA: Diagnosis not present

## 2018-01-29 DIAGNOSIS — G4733 Obstructive sleep apnea (adult) (pediatric): Secondary | ICD-10-CM | POA: Diagnosis not present

## 2018-01-29 DIAGNOSIS — Z6839 Body mass index (BMI) 39.0-39.9, adult: Secondary | ICD-10-CM | POA: Diagnosis not present

## 2018-01-29 DIAGNOSIS — Z9989 Dependence on other enabling machines and devices: Secondary | ICD-10-CM | POA: Diagnosis not present

## 2018-02-10 ENCOUNTER — Encounter: Payer: Self-pay | Admitting: Gastroenterology

## 2018-02-17 DIAGNOSIS — M5136 Other intervertebral disc degeneration, lumbar region: Secondary | ICD-10-CM | POA: Diagnosis not present

## 2018-02-17 DIAGNOSIS — G8929 Other chronic pain: Secondary | ICD-10-CM | POA: Diagnosis not present

## 2018-02-17 DIAGNOSIS — M7918 Myalgia, other site: Secondary | ICD-10-CM | POA: Insufficient documentation

## 2018-02-17 DIAGNOSIS — M62830 Muscle spasm of back: Secondary | ICD-10-CM | POA: Insufficient documentation

## 2018-02-17 HISTORY — DX: Muscle spasm of back: M62.830

## 2018-02-18 DIAGNOSIS — M1712 Unilateral primary osteoarthritis, left knee: Secondary | ICD-10-CM | POA: Diagnosis not present

## 2018-02-19 DIAGNOSIS — M5442 Lumbago with sciatica, left side: Secondary | ICD-10-CM | POA: Diagnosis not present

## 2018-02-19 DIAGNOSIS — M9903 Segmental and somatic dysfunction of lumbar region: Secondary | ICD-10-CM | POA: Diagnosis not present

## 2018-02-19 DIAGNOSIS — M9902 Segmental and somatic dysfunction of thoracic region: Secondary | ICD-10-CM | POA: Diagnosis not present

## 2018-02-19 DIAGNOSIS — M5137 Other intervertebral disc degeneration, lumbosacral region: Secondary | ICD-10-CM | POA: Diagnosis not present

## 2018-02-19 DIAGNOSIS — M9904 Segmental and somatic dysfunction of sacral region: Secondary | ICD-10-CM | POA: Diagnosis not present

## 2018-02-19 DIAGNOSIS — M5136 Other intervertebral disc degeneration, lumbar region: Secondary | ICD-10-CM | POA: Diagnosis not present

## 2018-02-19 DIAGNOSIS — M546 Pain in thoracic spine: Secondary | ICD-10-CM | POA: Diagnosis not present

## 2018-02-20 DIAGNOSIS — M5136 Other intervertebral disc degeneration, lumbar region: Secondary | ICD-10-CM | POA: Diagnosis not present

## 2018-02-20 DIAGNOSIS — M5442 Lumbago with sciatica, left side: Secondary | ICD-10-CM | POA: Diagnosis not present

## 2018-02-20 DIAGNOSIS — M9903 Segmental and somatic dysfunction of lumbar region: Secondary | ICD-10-CM | POA: Diagnosis not present

## 2018-02-20 DIAGNOSIS — M9902 Segmental and somatic dysfunction of thoracic region: Secondary | ICD-10-CM | POA: Diagnosis not present

## 2018-02-20 DIAGNOSIS — M9904 Segmental and somatic dysfunction of sacral region: Secondary | ICD-10-CM | POA: Diagnosis not present

## 2018-02-20 DIAGNOSIS — M5137 Other intervertebral disc degeneration, lumbosacral region: Secondary | ICD-10-CM | POA: Diagnosis not present

## 2018-02-20 DIAGNOSIS — M546 Pain in thoracic spine: Secondary | ICD-10-CM | POA: Diagnosis not present

## 2018-02-23 DIAGNOSIS — M5442 Lumbago with sciatica, left side: Secondary | ICD-10-CM | POA: Diagnosis not present

## 2018-02-23 DIAGNOSIS — E291 Testicular hypofunction: Secondary | ICD-10-CM | POA: Diagnosis not present

## 2018-02-23 DIAGNOSIS — M5136 Other intervertebral disc degeneration, lumbar region: Secondary | ICD-10-CM | POA: Diagnosis not present

## 2018-02-23 DIAGNOSIS — M9904 Segmental and somatic dysfunction of sacral region: Secondary | ICD-10-CM | POA: Diagnosis not present

## 2018-02-23 DIAGNOSIS — M9903 Segmental and somatic dysfunction of lumbar region: Secondary | ICD-10-CM | POA: Diagnosis not present

## 2018-02-23 DIAGNOSIS — M9902 Segmental and somatic dysfunction of thoracic region: Secondary | ICD-10-CM | POA: Diagnosis not present

## 2018-02-23 DIAGNOSIS — M5137 Other intervertebral disc degeneration, lumbosacral region: Secondary | ICD-10-CM | POA: Diagnosis not present

## 2018-02-23 DIAGNOSIS — M546 Pain in thoracic spine: Secondary | ICD-10-CM | POA: Diagnosis not present

## 2018-02-24 DIAGNOSIS — R768 Other specified abnormal immunological findings in serum: Secondary | ICD-10-CM | POA: Diagnosis not present

## 2018-02-24 DIAGNOSIS — M79643 Pain in unspecified hand: Secondary | ICD-10-CM | POA: Diagnosis not present

## 2018-02-24 DIAGNOSIS — M1990 Unspecified osteoarthritis, unspecified site: Secondary | ICD-10-CM | POA: Diagnosis not present

## 2018-02-24 DIAGNOSIS — M8580 Other specified disorders of bone density and structure, unspecified site: Secondary | ICD-10-CM | POA: Diagnosis not present

## 2018-02-24 DIAGNOSIS — M79641 Pain in right hand: Secondary | ICD-10-CM | POA: Diagnosis not present

## 2018-02-24 DIAGNOSIS — M19049 Primary osteoarthritis, unspecified hand: Secondary | ICD-10-CM | POA: Diagnosis not present

## 2018-02-26 DIAGNOSIS — M546 Pain in thoracic spine: Secondary | ICD-10-CM | POA: Diagnosis not present

## 2018-02-26 DIAGNOSIS — M9902 Segmental and somatic dysfunction of thoracic region: Secondary | ICD-10-CM | POA: Diagnosis not present

## 2018-02-26 DIAGNOSIS — M9903 Segmental and somatic dysfunction of lumbar region: Secondary | ICD-10-CM | POA: Diagnosis not present

## 2018-02-26 DIAGNOSIS — M5136 Other intervertebral disc degeneration, lumbar region: Secondary | ICD-10-CM | POA: Diagnosis not present

## 2018-02-26 DIAGNOSIS — M5137 Other intervertebral disc degeneration, lumbosacral region: Secondary | ICD-10-CM | POA: Diagnosis not present

## 2018-02-26 DIAGNOSIS — M9904 Segmental and somatic dysfunction of sacral region: Secondary | ICD-10-CM | POA: Diagnosis not present

## 2018-02-26 DIAGNOSIS — M5442 Lumbago with sciatica, left side: Secondary | ICD-10-CM | POA: Diagnosis not present

## 2018-02-27 DIAGNOSIS — Z23 Encounter for immunization: Secondary | ICD-10-CM | POA: Diagnosis not present

## 2018-02-27 DIAGNOSIS — G4733 Obstructive sleep apnea (adult) (pediatric): Secondary | ICD-10-CM | POA: Diagnosis not present

## 2018-02-27 DIAGNOSIS — Z9989 Dependence on other enabling machines and devices: Secondary | ICD-10-CM | POA: Diagnosis not present

## 2018-02-27 DIAGNOSIS — Z6839 Body mass index (BMI) 39.0-39.9, adult: Secondary | ICD-10-CM | POA: Diagnosis not present

## 2018-02-27 DIAGNOSIS — M48061 Spinal stenosis, lumbar region without neurogenic claudication: Secondary | ICD-10-CM | POA: Diagnosis not present

## 2018-03-05 DIAGNOSIS — M25562 Pain in left knee: Secondary | ICD-10-CM | POA: Diagnosis not present

## 2018-03-05 DIAGNOSIS — M545 Low back pain: Secondary | ICD-10-CM | POA: Diagnosis not present

## 2018-03-05 DIAGNOSIS — M25572 Pain in left ankle and joints of left foot: Secondary | ICD-10-CM | POA: Diagnosis not present

## 2018-03-05 DIAGNOSIS — R2689 Other abnormalities of gait and mobility: Secondary | ICD-10-CM | POA: Diagnosis not present

## 2018-03-13 DIAGNOSIS — M545 Low back pain: Secondary | ICD-10-CM | POA: Diagnosis not present

## 2018-03-13 DIAGNOSIS — M25572 Pain in left ankle and joints of left foot: Secondary | ICD-10-CM | POA: Diagnosis not present

## 2018-03-13 DIAGNOSIS — R2689 Other abnormalities of gait and mobility: Secondary | ICD-10-CM | POA: Diagnosis not present

## 2018-03-13 DIAGNOSIS — M25562 Pain in left knee: Secondary | ICD-10-CM | POA: Diagnosis not present

## 2018-03-16 DIAGNOSIS — Z79890 Hormone replacement therapy (postmenopausal): Secondary | ICD-10-CM | POA: Diagnosis not present

## 2018-03-16 DIAGNOSIS — E1140 Type 2 diabetes mellitus with diabetic neuropathy, unspecified: Secondary | ICD-10-CM | POA: Diagnosis not present

## 2018-03-16 DIAGNOSIS — Z6839 Body mass index (BMI) 39.0-39.9, adult: Secondary | ICD-10-CM | POA: Diagnosis not present

## 2018-03-16 DIAGNOSIS — Z9989 Dependence on other enabling machines and devices: Secondary | ICD-10-CM | POA: Diagnosis not present

## 2018-03-16 DIAGNOSIS — Z862 Personal history of diseases of the blood and blood-forming organs and certain disorders involving the immune mechanism: Secondary | ICD-10-CM | POA: Diagnosis not present

## 2018-03-17 DIAGNOSIS — M19072 Primary osteoarthritis, left ankle and foot: Secondary | ICD-10-CM | POA: Diagnosis not present

## 2018-03-17 DIAGNOSIS — M722 Plantar fascial fibromatosis: Secondary | ICD-10-CM | POA: Diagnosis not present

## 2018-03-17 DIAGNOSIS — M6702 Short Achilles tendon (acquired), left ankle: Secondary | ICD-10-CM | POA: Diagnosis not present

## 2018-03-17 DIAGNOSIS — E119 Type 2 diabetes mellitus without complications: Secondary | ICD-10-CM | POA: Diagnosis not present

## 2018-03-18 DIAGNOSIS — E1140 Type 2 diabetes mellitus with diabetic neuropathy, unspecified: Secondary | ICD-10-CM | POA: Diagnosis not present

## 2018-03-18 DIAGNOSIS — M25572 Pain in left ankle and joints of left foot: Secondary | ICD-10-CM | POA: Diagnosis not present

## 2018-03-18 DIAGNOSIS — M545 Low back pain: Secondary | ICD-10-CM | POA: Diagnosis not present

## 2018-03-18 DIAGNOSIS — G4733 Obstructive sleep apnea (adult) (pediatric): Secondary | ICD-10-CM | POA: Diagnosis not present

## 2018-03-18 DIAGNOSIS — R2689 Other abnormalities of gait and mobility: Secondary | ICD-10-CM | POA: Diagnosis not present

## 2018-03-18 DIAGNOSIS — M25562 Pain in left knee: Secondary | ICD-10-CM | POA: Diagnosis not present

## 2018-03-19 DIAGNOSIS — E782 Mixed hyperlipidemia: Secondary | ICD-10-CM | POA: Diagnosis not present

## 2018-03-19 DIAGNOSIS — E1140 Type 2 diabetes mellitus with diabetic neuropathy, unspecified: Secondary | ICD-10-CM | POA: Diagnosis not present

## 2018-03-19 DIAGNOSIS — Z79890 Hormone replacement therapy (postmenopausal): Secondary | ICD-10-CM | POA: Diagnosis not present

## 2018-03-24 DIAGNOSIS — M25562 Pain in left knee: Secondary | ICD-10-CM | POA: Diagnosis not present

## 2018-03-24 DIAGNOSIS — R2689 Other abnormalities of gait and mobility: Secondary | ICD-10-CM | POA: Diagnosis not present

## 2018-03-24 DIAGNOSIS — M25572 Pain in left ankle and joints of left foot: Secondary | ICD-10-CM | POA: Diagnosis not present

## 2018-03-24 DIAGNOSIS — M545 Low back pain: Secondary | ICD-10-CM | POA: Diagnosis not present

## 2018-03-25 DIAGNOSIS — G4710 Hypersomnia, unspecified: Secondary | ICD-10-CM | POA: Diagnosis not present

## 2018-03-25 DIAGNOSIS — R635 Abnormal weight gain: Secondary | ICD-10-CM | POA: Diagnosis not present

## 2018-03-25 DIAGNOSIS — E782 Mixed hyperlipidemia: Secondary | ICD-10-CM | POA: Diagnosis not present

## 2018-03-25 DIAGNOSIS — Z6841 Body Mass Index (BMI) 40.0 and over, adult: Secondary | ICD-10-CM | POA: Diagnosis not present

## 2018-03-25 DIAGNOSIS — E1140 Type 2 diabetes mellitus with diabetic neuropathy, unspecified: Secondary | ICD-10-CM | POA: Diagnosis not present

## 2018-03-25 DIAGNOSIS — R0683 Snoring: Secondary | ICD-10-CM | POA: Diagnosis not present

## 2018-03-25 DIAGNOSIS — R29818 Other symptoms and signs involving the nervous system: Secondary | ICD-10-CM | POA: Diagnosis not present

## 2018-03-25 DIAGNOSIS — E119 Type 2 diabetes mellitus without complications: Secondary | ICD-10-CM | POA: Diagnosis not present

## 2018-03-25 DIAGNOSIS — E291 Testicular hypofunction: Secondary | ICD-10-CM | POA: Diagnosis not present

## 2018-03-25 DIAGNOSIS — E1159 Type 2 diabetes mellitus with other circulatory complications: Secondary | ICD-10-CM | POA: Diagnosis not present

## 2018-03-27 DIAGNOSIS — R2689 Other abnormalities of gait and mobility: Secondary | ICD-10-CM | POA: Diagnosis not present

## 2018-03-27 DIAGNOSIS — M25562 Pain in left knee: Secondary | ICD-10-CM | POA: Diagnosis not present

## 2018-03-27 DIAGNOSIS — M25572 Pain in left ankle and joints of left foot: Secondary | ICD-10-CM | POA: Diagnosis not present

## 2018-03-27 DIAGNOSIS — M545 Low back pain: Secondary | ICD-10-CM | POA: Diagnosis not present

## 2018-03-30 DIAGNOSIS — M545 Low back pain: Secondary | ICD-10-CM | POA: Diagnosis not present

## 2018-03-30 DIAGNOSIS — M25572 Pain in left ankle and joints of left foot: Secondary | ICD-10-CM | POA: Diagnosis not present

## 2018-03-30 DIAGNOSIS — M25562 Pain in left knee: Secondary | ICD-10-CM | POA: Diagnosis not present

## 2018-03-30 DIAGNOSIS — R2689 Other abnormalities of gait and mobility: Secondary | ICD-10-CM | POA: Diagnosis not present

## 2018-04-02 DIAGNOSIS — M545 Low back pain: Secondary | ICD-10-CM | POA: Diagnosis not present

## 2018-04-02 DIAGNOSIS — M25572 Pain in left ankle and joints of left foot: Secondary | ICD-10-CM | POA: Diagnosis not present

## 2018-04-02 DIAGNOSIS — M25562 Pain in left knee: Secondary | ICD-10-CM | POA: Diagnosis not present

## 2018-04-02 DIAGNOSIS — R2689 Other abnormalities of gait and mobility: Secondary | ICD-10-CM | POA: Diagnosis not present

## 2018-04-07 DIAGNOSIS — E291 Testicular hypofunction: Secondary | ICD-10-CM | POA: Diagnosis not present

## 2018-04-09 DIAGNOSIS — L600 Ingrowing nail: Secondary | ICD-10-CM | POA: Diagnosis not present

## 2018-04-09 DIAGNOSIS — Z6841 Body Mass Index (BMI) 40.0 and over, adult: Secondary | ICD-10-CM | POA: Diagnosis not present

## 2018-04-09 DIAGNOSIS — E1140 Type 2 diabetes mellitus with diabetic neuropathy, unspecified: Secondary | ICD-10-CM | POA: Diagnosis not present

## 2018-04-14 DIAGNOSIS — L03032 Cellulitis of left toe: Secondary | ICD-10-CM | POA: Diagnosis not present

## 2018-04-14 DIAGNOSIS — E119 Type 2 diabetes mellitus without complications: Secondary | ICD-10-CM | POA: Diagnosis not present

## 2018-04-14 DIAGNOSIS — Z7984 Long term (current) use of oral hypoglycemic drugs: Secondary | ICD-10-CM | POA: Diagnosis not present

## 2018-04-14 DIAGNOSIS — B351 Tinea unguium: Secondary | ICD-10-CM | POA: Diagnosis not present

## 2018-04-16 DIAGNOSIS — I10 Essential (primary) hypertension: Secondary | ICD-10-CM | POA: Diagnosis not present

## 2018-04-16 DIAGNOSIS — E1159 Type 2 diabetes mellitus with other circulatory complications: Secondary | ICD-10-CM | POA: Diagnosis not present

## 2018-04-16 DIAGNOSIS — E1140 Type 2 diabetes mellitus with diabetic neuropathy, unspecified: Secondary | ICD-10-CM | POA: Diagnosis not present

## 2018-04-16 DIAGNOSIS — E782 Mixed hyperlipidemia: Secondary | ICD-10-CM | POA: Diagnosis not present

## 2018-04-21 DIAGNOSIS — R69 Illness, unspecified: Secondary | ICD-10-CM | POA: Diagnosis not present

## 2018-04-23 DIAGNOSIS — Z4889 Encounter for other specified surgical aftercare: Secondary | ICD-10-CM

## 2018-04-23 HISTORY — DX: Encounter for other specified surgical aftercare: Z48.89

## 2018-04-24 DIAGNOSIS — M25572 Pain in left ankle and joints of left foot: Secondary | ICD-10-CM | POA: Diagnosis not present

## 2018-04-24 DIAGNOSIS — M25562 Pain in left knee: Secondary | ICD-10-CM | POA: Diagnosis not present

## 2018-04-24 DIAGNOSIS — R2689 Other abnormalities of gait and mobility: Secondary | ICD-10-CM | POA: Diagnosis not present

## 2018-04-24 DIAGNOSIS — M545 Low back pain: Secondary | ICD-10-CM | POA: Diagnosis not present

## 2018-04-27 DIAGNOSIS — M545 Low back pain: Secondary | ICD-10-CM | POA: Diagnosis not present

## 2018-04-27 DIAGNOSIS — R2689 Other abnormalities of gait and mobility: Secondary | ICD-10-CM | POA: Diagnosis not present

## 2018-04-27 DIAGNOSIS — M25562 Pain in left knee: Secondary | ICD-10-CM | POA: Diagnosis not present

## 2018-04-27 DIAGNOSIS — M25572 Pain in left ankle and joints of left foot: Secondary | ICD-10-CM | POA: Diagnosis not present

## 2018-04-28 DIAGNOSIS — E291 Testicular hypofunction: Secondary | ICD-10-CM | POA: Diagnosis not present

## 2018-04-29 DIAGNOSIS — M5416 Radiculopathy, lumbar region: Secondary | ICD-10-CM | POA: Diagnosis not present

## 2018-05-01 DIAGNOSIS — Z79899 Other long term (current) drug therapy: Secondary | ICD-10-CM | POA: Diagnosis not present

## 2018-05-01 DIAGNOSIS — E119 Type 2 diabetes mellitus without complications: Secondary | ICD-10-CM | POA: Diagnosis not present

## 2018-05-01 DIAGNOSIS — Z6841 Body Mass Index (BMI) 40.0 and over, adult: Secondary | ICD-10-CM | POA: Diagnosis not present

## 2018-05-01 DIAGNOSIS — Z7952 Long term (current) use of systemic steroids: Secondary | ICD-10-CM | POA: Diagnosis not present

## 2018-05-01 DIAGNOSIS — Z9189 Other specified personal risk factors, not elsewhere classified: Secondary | ICD-10-CM | POA: Diagnosis not present

## 2018-05-01 DIAGNOSIS — E7800 Pure hypercholesterolemia, unspecified: Secondary | ICD-10-CM | POA: Diagnosis not present

## 2018-05-01 DIAGNOSIS — Z7984 Long term (current) use of oral hypoglycemic drugs: Secondary | ICD-10-CM | POA: Diagnosis not present

## 2018-05-01 DIAGNOSIS — I160 Hypertensive urgency: Secondary | ICD-10-CM | POA: Diagnosis not present

## 2018-05-01 DIAGNOSIS — R51 Headache: Secondary | ICD-10-CM | POA: Diagnosis not present

## 2018-05-01 DIAGNOSIS — R69 Illness, unspecified: Secondary | ICD-10-CM | POA: Diagnosis not present

## 2018-05-01 DIAGNOSIS — J45909 Unspecified asthma, uncomplicated: Secondary | ICD-10-CM | POA: Diagnosis not present

## 2018-05-06 DIAGNOSIS — M1990 Unspecified osteoarthritis, unspecified site: Secondary | ICD-10-CM | POA: Diagnosis not present

## 2018-05-06 DIAGNOSIS — R768 Other specified abnormal immunological findings in serum: Secondary | ICD-10-CM | POA: Diagnosis not present

## 2018-05-06 DIAGNOSIS — M8580 Other specified disorders of bone density and structure, unspecified site: Secondary | ICD-10-CM | POA: Diagnosis not present

## 2018-05-06 DIAGNOSIS — M19049 Primary osteoarthritis, unspecified hand: Secondary | ICD-10-CM | POA: Diagnosis not present

## 2018-05-06 DIAGNOSIS — M79641 Pain in right hand: Secondary | ICD-10-CM | POA: Diagnosis not present

## 2018-05-06 DIAGNOSIS — M79642 Pain in left hand: Secondary | ICD-10-CM | POA: Diagnosis not present

## 2018-05-06 DIAGNOSIS — M79643 Pain in unspecified hand: Secondary | ICD-10-CM | POA: Diagnosis not present

## 2018-05-16 DIAGNOSIS — E782 Mixed hyperlipidemia: Secondary | ICD-10-CM | POA: Diagnosis not present

## 2018-05-16 DIAGNOSIS — Z6841 Body Mass Index (BMI) 40.0 and over, adult: Secondary | ICD-10-CM | POA: Diagnosis not present

## 2018-05-16 DIAGNOSIS — E1140 Type 2 diabetes mellitus with diabetic neuropathy, unspecified: Secondary | ICD-10-CM | POA: Diagnosis not present

## 2018-05-22 ENCOUNTER — Other Ambulatory Visit: Payer: Self-pay

## 2018-05-22 ENCOUNTER — Ambulatory Visit: Payer: Self-pay | Admitting: Sports Medicine

## 2018-05-25 DIAGNOSIS — E119 Type 2 diabetes mellitus without complications: Secondary | ICD-10-CM | POA: Diagnosis not present

## 2018-05-25 DIAGNOSIS — Q742 Other congenital malformations of lower limb(s), including pelvic girdle: Secondary | ICD-10-CM | POA: Diagnosis not present

## 2018-05-25 DIAGNOSIS — M19079 Primary osteoarthritis, unspecified ankle and foot: Secondary | ICD-10-CM | POA: Diagnosis not present

## 2018-05-25 DIAGNOSIS — M79672 Pain in left foot: Secondary | ICD-10-CM

## 2018-05-25 HISTORY — DX: Pain in left foot: M79.672

## 2018-06-05 DIAGNOSIS — J069 Acute upper respiratory infection, unspecified: Secondary | ICD-10-CM | POA: Diagnosis not present

## 2018-06-05 DIAGNOSIS — J029 Acute pharyngitis, unspecified: Secondary | ICD-10-CM | POA: Diagnosis not present

## 2018-06-08 DIAGNOSIS — Z6839 Body mass index (BMI) 39.0-39.9, adult: Secondary | ICD-10-CM | POA: Diagnosis not present

## 2018-06-08 DIAGNOSIS — R05 Cough: Secondary | ICD-10-CM | POA: Diagnosis not present

## 2018-06-08 DIAGNOSIS — J029 Acute pharyngitis, unspecified: Secondary | ICD-10-CM | POA: Diagnosis not present

## 2018-06-09 DIAGNOSIS — R05 Cough: Secondary | ICD-10-CM | POA: Diagnosis not present

## 2018-06-09 DIAGNOSIS — J45901 Unspecified asthma with (acute) exacerbation: Secondary | ICD-10-CM | POA: Diagnosis not present

## 2018-06-15 DIAGNOSIS — Z0000 Encounter for general adult medical examination without abnormal findings: Secondary | ICD-10-CM | POA: Diagnosis not present

## 2018-06-15 DIAGNOSIS — Z1331 Encounter for screening for depression: Secondary | ICD-10-CM | POA: Diagnosis not present

## 2018-06-15 DIAGNOSIS — E291 Testicular hypofunction: Secondary | ICD-10-CM | POA: Diagnosis not present

## 2018-06-15 DIAGNOSIS — Z139 Encounter for screening, unspecified: Secondary | ICD-10-CM | POA: Diagnosis not present

## 2018-06-16 DIAGNOSIS — E1159 Type 2 diabetes mellitus with other circulatory complications: Secondary | ICD-10-CM | POA: Diagnosis not present

## 2018-06-16 DIAGNOSIS — I10 Essential (primary) hypertension: Secondary | ICD-10-CM | POA: Diagnosis not present

## 2018-06-16 DIAGNOSIS — E1140 Type 2 diabetes mellitus with diabetic neuropathy, unspecified: Secondary | ICD-10-CM | POA: Diagnosis not present

## 2018-06-16 DIAGNOSIS — E782 Mixed hyperlipidemia: Secondary | ICD-10-CM | POA: Diagnosis not present

## 2018-06-18 DIAGNOSIS — Z6841 Body Mass Index (BMI) 40.0 and over, adult: Secondary | ICD-10-CM | POA: Diagnosis not present

## 2018-06-18 DIAGNOSIS — G4733 Obstructive sleep apnea (adult) (pediatric): Secondary | ICD-10-CM | POA: Diagnosis not present

## 2018-06-18 DIAGNOSIS — R29818 Other symptoms and signs involving the nervous system: Secondary | ICD-10-CM | POA: Diagnosis not present

## 2018-06-18 DIAGNOSIS — R635 Abnormal weight gain: Secondary | ICD-10-CM | POA: Diagnosis not present

## 2018-06-18 DIAGNOSIS — G4710 Hypersomnia, unspecified: Secondary | ICD-10-CM | POA: Diagnosis not present

## 2018-06-18 DIAGNOSIS — R0683 Snoring: Secondary | ICD-10-CM | POA: Diagnosis not present

## 2018-06-20 DIAGNOSIS — T1501XA Foreign body in cornea, right eye, initial encounter: Secondary | ICD-10-CM | POA: Diagnosis not present

## 2018-06-22 DIAGNOSIS — Z6841 Body Mass Index (BMI) 40.0 and over, adult: Secondary | ICD-10-CM | POA: Diagnosis not present

## 2018-06-22 DIAGNOSIS — R159 Full incontinence of feces: Secondary | ICD-10-CM | POA: Diagnosis not present

## 2018-06-24 DIAGNOSIS — M546 Pain in thoracic spine: Secondary | ICD-10-CM | POA: Diagnosis not present

## 2018-06-24 DIAGNOSIS — M5134 Other intervertebral disc degeneration, thoracic region: Secondary | ICD-10-CM | POA: Diagnosis not present

## 2018-06-24 DIAGNOSIS — G8929 Other chronic pain: Secondary | ICD-10-CM | POA: Diagnosis not present

## 2018-07-01 DIAGNOSIS — M810 Age-related osteoporosis without current pathological fracture: Secondary | ICD-10-CM | POA: Diagnosis not present

## 2018-07-02 DIAGNOSIS — Z9989 Dependence on other enabling machines and devices: Secondary | ICD-10-CM | POA: Diagnosis not present

## 2018-07-02 DIAGNOSIS — G4733 Obstructive sleep apnea (adult) (pediatric): Secondary | ICD-10-CM | POA: Diagnosis not present

## 2018-07-02 DIAGNOSIS — E119 Type 2 diabetes mellitus without complications: Secondary | ICD-10-CM | POA: Diagnosis not present

## 2018-07-02 DIAGNOSIS — R69 Illness, unspecified: Secondary | ICD-10-CM | POA: Diagnosis not present

## 2018-07-02 DIAGNOSIS — F3130 Bipolar disorder, current episode depressed, mild or moderate severity, unspecified: Secondary | ICD-10-CM | POA: Diagnosis not present

## 2018-07-06 DIAGNOSIS — E291 Testicular hypofunction: Secondary | ICD-10-CM | POA: Diagnosis not present

## 2018-07-07 DIAGNOSIS — M79645 Pain in left finger(s): Secondary | ICD-10-CM | POA: Diagnosis not present

## 2018-07-07 DIAGNOSIS — M79641 Pain in right hand: Secondary | ICD-10-CM | POA: Diagnosis not present

## 2018-07-07 DIAGNOSIS — M79644 Pain in right finger(s): Secondary | ICD-10-CM | POA: Diagnosis not present

## 2018-07-07 DIAGNOSIS — M13841 Other specified arthritis, right hand: Secondary | ICD-10-CM | POA: Diagnosis not present

## 2018-07-07 DIAGNOSIS — M79642 Pain in left hand: Secondary | ICD-10-CM | POA: Diagnosis not present

## 2018-07-11 DIAGNOSIS — H52209 Unspecified astigmatism, unspecified eye: Secondary | ICD-10-CM | POA: Diagnosis not present

## 2018-07-11 DIAGNOSIS — H5213 Myopia, bilateral: Secondary | ICD-10-CM | POA: Diagnosis not present

## 2018-07-15 DIAGNOSIS — Z6841 Body Mass Index (BMI) 40.0 and over, adult: Secondary | ICD-10-CM | POA: Diagnosis not present

## 2018-07-15 DIAGNOSIS — E1140 Type 2 diabetes mellitus with diabetic neuropathy, unspecified: Secondary | ICD-10-CM | POA: Diagnosis not present

## 2018-07-17 DIAGNOSIS — Z6841 Body Mass Index (BMI) 40.0 and over, adult: Secondary | ICD-10-CM | POA: Diagnosis not present

## 2018-07-17 DIAGNOSIS — E1140 Type 2 diabetes mellitus with diabetic neuropathy, unspecified: Secondary | ICD-10-CM | POA: Diagnosis not present

## 2018-07-17 DIAGNOSIS — E782 Mixed hyperlipidemia: Secondary | ICD-10-CM | POA: Diagnosis not present

## 2018-07-20 DIAGNOSIS — E291 Testicular hypofunction: Secondary | ICD-10-CM | POA: Diagnosis not present

## 2018-07-20 DIAGNOSIS — E782 Mixed hyperlipidemia: Secondary | ICD-10-CM | POA: Diagnosis not present

## 2018-07-20 DIAGNOSIS — E1140 Type 2 diabetes mellitus with diabetic neuropathy, unspecified: Secondary | ICD-10-CM | POA: Diagnosis not present

## 2018-07-27 DIAGNOSIS — I10 Essential (primary) hypertension: Secondary | ICD-10-CM | POA: Diagnosis not present

## 2018-07-27 DIAGNOSIS — E291 Testicular hypofunction: Secondary | ICD-10-CM | POA: Diagnosis not present

## 2018-07-27 DIAGNOSIS — E782 Mixed hyperlipidemia: Secondary | ICD-10-CM | POA: Diagnosis not present

## 2018-07-27 DIAGNOSIS — E1140 Type 2 diabetes mellitus with diabetic neuropathy, unspecified: Secondary | ICD-10-CM | POA: Diagnosis not present

## 2018-07-27 DIAGNOSIS — E1159 Type 2 diabetes mellitus with other circulatory complications: Secondary | ICD-10-CM | POA: Diagnosis not present

## 2018-08-05 DIAGNOSIS — Z86010 Personal history of colonic polyps: Secondary | ICD-10-CM | POA: Diagnosis not present

## 2018-08-05 DIAGNOSIS — Z1211 Encounter for screening for malignant neoplasm of colon: Secondary | ICD-10-CM | POA: Diagnosis not present

## 2018-08-13 DIAGNOSIS — G4733 Obstructive sleep apnea (adult) (pediatric): Secondary | ICD-10-CM | POA: Diagnosis not present

## 2018-08-13 DIAGNOSIS — Z9989 Dependence on other enabling machines and devices: Secondary | ICD-10-CM | POA: Diagnosis not present

## 2018-08-14 DIAGNOSIS — I10 Essential (primary) hypertension: Secondary | ICD-10-CM | POA: Diagnosis not present

## 2018-08-14 DIAGNOSIS — E1140 Type 2 diabetes mellitus with diabetic neuropathy, unspecified: Secondary | ICD-10-CM | POA: Diagnosis not present

## 2018-08-14 DIAGNOSIS — E782 Mixed hyperlipidemia: Secondary | ICD-10-CM | POA: Diagnosis not present

## 2018-08-14 DIAGNOSIS — E1159 Type 2 diabetes mellitus with other circulatory complications: Secondary | ICD-10-CM | POA: Diagnosis not present

## 2018-08-17 DIAGNOSIS — E291 Testicular hypofunction: Secondary | ICD-10-CM | POA: Diagnosis not present

## 2018-08-18 DIAGNOSIS — R69 Illness, unspecified: Secondary | ICD-10-CM | POA: Diagnosis not present

## 2018-08-18 DIAGNOSIS — Z6841 Body Mass Index (BMI) 40.0 and over, adult: Secondary | ICD-10-CM | POA: Diagnosis not present

## 2018-08-18 DIAGNOSIS — R55 Syncope and collapse: Secondary | ICD-10-CM | POA: Diagnosis not present

## 2018-09-07 DIAGNOSIS — E291 Testicular hypofunction: Secondary | ICD-10-CM | POA: Diagnosis not present

## 2018-09-15 DIAGNOSIS — E1159 Type 2 diabetes mellitus with other circulatory complications: Secondary | ICD-10-CM | POA: Diagnosis not present

## 2018-09-15 DIAGNOSIS — E1140 Type 2 diabetes mellitus with diabetic neuropathy, unspecified: Secondary | ICD-10-CM | POA: Diagnosis not present

## 2018-09-15 DIAGNOSIS — I10 Essential (primary) hypertension: Secondary | ICD-10-CM | POA: Diagnosis not present

## 2018-09-15 DIAGNOSIS — E782 Mixed hyperlipidemia: Secondary | ICD-10-CM | POA: Diagnosis not present

## 2018-09-22 DIAGNOSIS — M79672 Pain in left foot: Secondary | ICD-10-CM | POA: Diagnosis not present

## 2018-09-22 DIAGNOSIS — M19072 Primary osteoarthritis, left ankle and foot: Secondary | ICD-10-CM | POA: Diagnosis not present

## 2018-09-22 DIAGNOSIS — E119 Type 2 diabetes mellitus without complications: Secondary | ICD-10-CM | POA: Diagnosis not present

## 2018-09-22 DIAGNOSIS — B351 Tinea unguium: Secondary | ICD-10-CM | POA: Diagnosis not present

## 2018-09-28 DIAGNOSIS — E291 Testicular hypofunction: Secondary | ICD-10-CM | POA: Diagnosis not present

## 2018-10-07 DIAGNOSIS — M1990 Unspecified osteoarthritis, unspecified site: Secondary | ICD-10-CM | POA: Diagnosis not present

## 2018-10-07 DIAGNOSIS — G5620 Lesion of ulnar nerve, unspecified upper limb: Secondary | ICD-10-CM | POA: Diagnosis not present

## 2018-10-13 DIAGNOSIS — G4733 Obstructive sleep apnea (adult) (pediatric): Secondary | ICD-10-CM | POA: Diagnosis not present

## 2018-10-13 DIAGNOSIS — Z9989 Dependence on other enabling machines and devices: Secondary | ICD-10-CM | POA: Diagnosis not present

## 2018-10-13 DIAGNOSIS — R202 Paresthesia of skin: Secondary | ICD-10-CM | POA: Diagnosis not present

## 2018-10-15 DIAGNOSIS — I10 Essential (primary) hypertension: Secondary | ICD-10-CM | POA: Diagnosis not present

## 2018-10-15 DIAGNOSIS — E782 Mixed hyperlipidemia: Secondary | ICD-10-CM | POA: Diagnosis not present

## 2018-10-15 DIAGNOSIS — E1140 Type 2 diabetes mellitus with diabetic neuropathy, unspecified: Secondary | ICD-10-CM | POA: Diagnosis not present

## 2018-10-15 DIAGNOSIS — E1159 Type 2 diabetes mellitus with other circulatory complications: Secondary | ICD-10-CM | POA: Diagnosis not present

## 2018-10-16 DIAGNOSIS — M5136 Other intervertebral disc degeneration, lumbar region: Secondary | ICD-10-CM | POA: Diagnosis not present

## 2018-10-16 DIAGNOSIS — M62830 Muscle spasm of back: Secondary | ICD-10-CM | POA: Diagnosis not present

## 2018-10-16 DIAGNOSIS — Z0289 Encounter for other administrative examinations: Secondary | ICD-10-CM

## 2018-10-16 DIAGNOSIS — Z79891 Long term (current) use of opiate analgesic: Secondary | ICD-10-CM | POA: Diagnosis not present

## 2018-10-16 DIAGNOSIS — G8929 Other chronic pain: Secondary | ICD-10-CM | POA: Diagnosis not present

## 2018-10-16 HISTORY — DX: Encounter for other administrative examinations: Z02.89

## 2018-10-19 DIAGNOSIS — G5620 Lesion of ulnar nerve, unspecified upper limb: Secondary | ICD-10-CM | POA: Diagnosis not present

## 2018-10-19 DIAGNOSIS — K5903 Drug induced constipation: Secondary | ICD-10-CM | POA: Diagnosis not present

## 2018-10-19 DIAGNOSIS — T402X5A Adverse effect of other opioids, initial encounter: Secondary | ICD-10-CM | POA: Diagnosis not present

## 2018-10-26 DIAGNOSIS — E291 Testicular hypofunction: Secondary | ICD-10-CM | POA: Diagnosis not present

## 2018-10-26 DIAGNOSIS — R69 Illness, unspecified: Secondary | ICD-10-CM | POA: Diagnosis not present

## 2018-10-27 DIAGNOSIS — Z1159 Encounter for screening for other viral diseases: Secondary | ICD-10-CM | POA: Diagnosis not present

## 2018-10-27 DIAGNOSIS — Z03818 Encounter for observation for suspected exposure to other biological agents ruled out: Secondary | ICD-10-CM | POA: Diagnosis not present

## 2018-11-03 DIAGNOSIS — M542 Cervicalgia: Secondary | ICD-10-CM | POA: Diagnosis not present

## 2018-11-03 DIAGNOSIS — Z9989 Dependence on other enabling machines and devices: Secondary | ICD-10-CM | POA: Diagnosis not present

## 2018-11-03 DIAGNOSIS — G4733 Obstructive sleep apnea (adult) (pediatric): Secondary | ICD-10-CM | POA: Diagnosis not present

## 2018-11-03 DIAGNOSIS — R202 Paresthesia of skin: Secondary | ICD-10-CM | POA: Diagnosis not present

## 2018-11-04 DIAGNOSIS — M542 Cervicalgia: Secondary | ICD-10-CM | POA: Diagnosis not present

## 2018-11-04 DIAGNOSIS — W19XXXA Unspecified fall, initial encounter: Secondary | ICD-10-CM | POA: Diagnosis not present

## 2018-11-04 DIAGNOSIS — R110 Nausea: Secondary | ICD-10-CM | POA: Diagnosis not present

## 2018-11-12 DIAGNOSIS — R51 Headache: Secondary | ICD-10-CM | POA: Diagnosis not present

## 2018-11-12 DIAGNOSIS — M542 Cervicalgia: Secondary | ICD-10-CM | POA: Diagnosis not present

## 2018-11-12 DIAGNOSIS — W19XXXA Unspecified fall, initial encounter: Secondary | ICD-10-CM | POA: Diagnosis not present

## 2018-11-12 DIAGNOSIS — S060X9A Concussion with loss of consciousness of unspecified duration, initial encounter: Secondary | ICD-10-CM | POA: Diagnosis not present

## 2018-11-12 DIAGNOSIS — R42 Dizziness and giddiness: Secondary | ICD-10-CM | POA: Diagnosis not present

## 2018-11-13 DIAGNOSIS — I10 Essential (primary) hypertension: Secondary | ICD-10-CM | POA: Diagnosis not present

## 2018-11-13 DIAGNOSIS — Z79891 Long term (current) use of opiate analgesic: Secondary | ICD-10-CM | POA: Diagnosis not present

## 2018-11-13 DIAGNOSIS — E1159 Type 2 diabetes mellitus with other circulatory complications: Secondary | ICD-10-CM | POA: Diagnosis not present

## 2018-11-13 DIAGNOSIS — M62830 Muscle spasm of back: Secondary | ICD-10-CM | POA: Diagnosis not present

## 2018-11-13 DIAGNOSIS — M5136 Other intervertebral disc degeneration, lumbar region: Secondary | ICD-10-CM | POA: Diagnosis not present

## 2018-11-13 DIAGNOSIS — G8929 Other chronic pain: Secondary | ICD-10-CM | POA: Diagnosis not present

## 2018-11-13 DIAGNOSIS — E782 Mixed hyperlipidemia: Secondary | ICD-10-CM | POA: Diagnosis not present

## 2018-11-13 DIAGNOSIS — E1140 Type 2 diabetes mellitus with diabetic neuropathy, unspecified: Secondary | ICD-10-CM | POA: Diagnosis not present

## 2018-11-13 DIAGNOSIS — Z760 Encounter for issue of repeat prescription: Secondary | ICD-10-CM | POA: Diagnosis not present

## 2018-11-16 DIAGNOSIS — E291 Testicular hypofunction: Secondary | ICD-10-CM | POA: Diagnosis not present

## 2018-11-17 DIAGNOSIS — R69 Illness, unspecified: Secondary | ICD-10-CM | POA: Diagnosis not present

## 2018-11-19 DIAGNOSIS — E782 Mixed hyperlipidemia: Secondary | ICD-10-CM | POA: Diagnosis not present

## 2018-11-19 DIAGNOSIS — E1140 Type 2 diabetes mellitus with diabetic neuropathy, unspecified: Secondary | ICD-10-CM | POA: Diagnosis not present

## 2018-11-25 DIAGNOSIS — E1140 Type 2 diabetes mellitus with diabetic neuropathy, unspecified: Secondary | ICD-10-CM | POA: Diagnosis not present

## 2018-11-25 DIAGNOSIS — I10 Essential (primary) hypertension: Secondary | ICD-10-CM | POA: Diagnosis not present

## 2018-11-25 DIAGNOSIS — E782 Mixed hyperlipidemia: Secondary | ICD-10-CM | POA: Diagnosis not present

## 2018-11-25 DIAGNOSIS — E1159 Type 2 diabetes mellitus with other circulatory complications: Secondary | ICD-10-CM | POA: Diagnosis not present

## 2018-12-07 DIAGNOSIS — M542 Cervicalgia: Secondary | ICD-10-CM | POA: Diagnosis not present

## 2018-12-10 DIAGNOSIS — E291 Testicular hypofunction: Secondary | ICD-10-CM | POA: Diagnosis not present

## 2018-12-15 DIAGNOSIS — E782 Mixed hyperlipidemia: Secondary | ICD-10-CM | POA: Diagnosis not present

## 2018-12-15 DIAGNOSIS — E1140 Type 2 diabetes mellitus with diabetic neuropathy, unspecified: Secondary | ICD-10-CM | POA: Diagnosis not present

## 2018-12-15 DIAGNOSIS — I10 Essential (primary) hypertension: Secondary | ICD-10-CM | POA: Diagnosis not present

## 2018-12-15 DIAGNOSIS — E1159 Type 2 diabetes mellitus with other circulatory complications: Secondary | ICD-10-CM | POA: Diagnosis not present

## 2018-12-17 DIAGNOSIS — Z79891 Long term (current) use of opiate analgesic: Secondary | ICD-10-CM | POA: Diagnosis not present

## 2018-12-17 DIAGNOSIS — Z5181 Encounter for therapeutic drug level monitoring: Secondary | ICD-10-CM | POA: Diagnosis not present

## 2018-12-17 DIAGNOSIS — M48061 Spinal stenosis, lumbar region without neurogenic claudication: Secondary | ICD-10-CM

## 2018-12-17 DIAGNOSIS — M9983 Other biomechanical lesions of lumbar region: Secondary | ICD-10-CM | POA: Diagnosis not present

## 2018-12-17 DIAGNOSIS — M5136 Other intervertebral disc degeneration, lumbar region: Secondary | ICD-10-CM | POA: Diagnosis not present

## 2018-12-17 DIAGNOSIS — G8929 Other chronic pain: Secondary | ICD-10-CM | POA: Diagnosis not present

## 2018-12-17 DIAGNOSIS — M5442 Lumbago with sciatica, left side: Secondary | ICD-10-CM | POA: Diagnosis not present

## 2018-12-17 DIAGNOSIS — M62830 Muscle spasm of back: Secondary | ICD-10-CM | POA: Diagnosis not present

## 2018-12-17 DIAGNOSIS — Z760 Encounter for issue of repeat prescription: Secondary | ICD-10-CM | POA: Diagnosis not present

## 2018-12-17 HISTORY — DX: Spinal stenosis, lumbar region without neurogenic claudication: M48.061

## 2018-12-23 DIAGNOSIS — R200 Anesthesia of skin: Secondary | ICD-10-CM | POA: Diagnosis not present

## 2018-12-23 DIAGNOSIS — R202 Paresthesia of skin: Secondary | ICD-10-CM | POA: Diagnosis not present

## 2018-12-24 DIAGNOSIS — L309 Dermatitis, unspecified: Secondary | ICD-10-CM | POA: Diagnosis not present

## 2018-12-29 DIAGNOSIS — M5412 Radiculopathy, cervical region: Secondary | ICD-10-CM | POA: Diagnosis not present

## 2018-12-31 DIAGNOSIS — E119 Type 2 diabetes mellitus without complications: Secondary | ICD-10-CM | POA: Diagnosis not present

## 2018-12-31 DIAGNOSIS — B351 Tinea unguium: Secondary | ICD-10-CM | POA: Diagnosis not present

## 2018-12-31 DIAGNOSIS — E291 Testicular hypofunction: Secondary | ICD-10-CM | POA: Diagnosis not present

## 2019-01-12 DIAGNOSIS — R69 Illness, unspecified: Secondary | ICD-10-CM | POA: Diagnosis not present

## 2019-01-14 DIAGNOSIS — M25461 Effusion, right knee: Secondary | ICD-10-CM | POA: Diagnosis not present

## 2019-01-14 DIAGNOSIS — M25561 Pain in right knee: Secondary | ICD-10-CM | POA: Diagnosis not present

## 2019-01-14 DIAGNOSIS — M19072 Primary osteoarthritis, left ankle and foot: Secondary | ICD-10-CM | POA: Diagnosis not present

## 2019-01-14 DIAGNOSIS — E119 Type 2 diabetes mellitus without complications: Secondary | ICD-10-CM | POA: Diagnosis not present

## 2019-01-14 DIAGNOSIS — M1711 Unilateral primary osteoarthritis, right knee: Secondary | ICD-10-CM | POA: Diagnosis not present

## 2019-01-15 DIAGNOSIS — E1140 Type 2 diabetes mellitus with diabetic neuropathy, unspecified: Secondary | ICD-10-CM | POA: Diagnosis not present

## 2019-01-15 DIAGNOSIS — G4733 Obstructive sleep apnea (adult) (pediatric): Secondary | ICD-10-CM | POA: Diagnosis not present

## 2019-01-15 DIAGNOSIS — Z9989 Dependence on other enabling machines and devices: Secondary | ICD-10-CM | POA: Diagnosis not present

## 2019-01-15 DIAGNOSIS — Z6841 Body Mass Index (BMI) 40.0 and over, adult: Secondary | ICD-10-CM | POA: Diagnosis not present

## 2019-01-20 DIAGNOSIS — L304 Erythema intertrigo: Secondary | ICD-10-CM | POA: Diagnosis not present

## 2019-01-20 DIAGNOSIS — L821 Other seborrheic keratosis: Secondary | ICD-10-CM | POA: Diagnosis not present

## 2019-01-21 DIAGNOSIS — E291 Testicular hypofunction: Secondary | ICD-10-CM | POA: Diagnosis not present

## 2019-02-01 DIAGNOSIS — M5412 Radiculopathy, cervical region: Secondary | ICD-10-CM | POA: Diagnosis not present

## 2019-02-01 DIAGNOSIS — M4722 Other spondylosis with radiculopathy, cervical region: Secondary | ICD-10-CM | POA: Diagnosis not present

## 2019-02-01 DIAGNOSIS — M4312 Spondylolisthesis, cervical region: Secondary | ICD-10-CM | POA: Diagnosis not present

## 2019-02-02 DIAGNOSIS — M4722 Other spondylosis with radiculopathy, cervical region: Secondary | ICD-10-CM | POA: Diagnosis not present

## 2019-02-02 DIAGNOSIS — G894 Chronic pain syndrome: Secondary | ICD-10-CM | POA: Insufficient documentation

## 2019-02-02 DIAGNOSIS — M47812 Spondylosis without myelopathy or radiculopathy, cervical region: Secondary | ICD-10-CM

## 2019-02-02 DIAGNOSIS — M4802 Spinal stenosis, cervical region: Secondary | ICD-10-CM

## 2019-02-02 DIAGNOSIS — M5412 Radiculopathy, cervical region: Secondary | ICD-10-CM | POA: Insufficient documentation

## 2019-02-02 HISTORY — DX: Chronic pain syndrome: G89.4

## 2019-02-02 HISTORY — DX: Radiculopathy, cervical region: M54.12

## 2019-02-02 HISTORY — DX: Spinal stenosis, cervical region: M48.02

## 2019-02-02 HISTORY — DX: Spondylosis without myelopathy or radiculopathy, cervical region: M47.812

## 2019-02-11 DIAGNOSIS — E291 Testicular hypofunction: Secondary | ICD-10-CM | POA: Diagnosis not present

## 2019-02-15 DIAGNOSIS — G4733 Obstructive sleep apnea (adult) (pediatric): Secondary | ICD-10-CM | POA: Diagnosis not present

## 2019-02-15 DIAGNOSIS — E1140 Type 2 diabetes mellitus with diabetic neuropathy, unspecified: Secondary | ICD-10-CM | POA: Diagnosis not present

## 2019-02-15 DIAGNOSIS — Z6841 Body Mass Index (BMI) 40.0 and over, adult: Secondary | ICD-10-CM | POA: Diagnosis not present

## 2019-02-17 DIAGNOSIS — E1140 Type 2 diabetes mellitus with diabetic neuropathy, unspecified: Secondary | ICD-10-CM | POA: Diagnosis not present

## 2019-02-17 DIAGNOSIS — E291 Testicular hypofunction: Secondary | ICD-10-CM | POA: Diagnosis not present

## 2019-02-17 DIAGNOSIS — E782 Mixed hyperlipidemia: Secondary | ICD-10-CM | POA: Diagnosis not present

## 2019-03-01 DIAGNOSIS — R69 Illness, unspecified: Secondary | ICD-10-CM | POA: Diagnosis not present

## 2019-03-03 DIAGNOSIS — I10 Essential (primary) hypertension: Secondary | ICD-10-CM | POA: Diagnosis not present

## 2019-03-03 DIAGNOSIS — E291 Testicular hypofunction: Secondary | ICD-10-CM | POA: Diagnosis not present

## 2019-03-03 DIAGNOSIS — E1140 Type 2 diabetes mellitus with diabetic neuropathy, unspecified: Secondary | ICD-10-CM | POA: Diagnosis not present

## 2019-03-03 DIAGNOSIS — E782 Mixed hyperlipidemia: Secondary | ICD-10-CM | POA: Diagnosis not present

## 2019-03-04 DIAGNOSIS — G4733 Obstructive sleep apnea (adult) (pediatric): Secondary | ICD-10-CM | POA: Diagnosis not present

## 2019-03-04 DIAGNOSIS — Z789 Other specified health status: Secondary | ICD-10-CM | POA: Diagnosis not present

## 2019-03-04 DIAGNOSIS — Z9989 Dependence on other enabling machines and devices: Secondary | ICD-10-CM | POA: Diagnosis not present

## 2019-03-17 DIAGNOSIS — E782 Mixed hyperlipidemia: Secondary | ICD-10-CM | POA: Diagnosis not present

## 2019-03-17 DIAGNOSIS — E1140 Type 2 diabetes mellitus with diabetic neuropathy, unspecified: Secondary | ICD-10-CM | POA: Diagnosis not present

## 2019-03-17 DIAGNOSIS — Z6841 Body Mass Index (BMI) 40.0 and over, adult: Secondary | ICD-10-CM | POA: Diagnosis not present

## 2019-03-17 DIAGNOSIS — I10 Essential (primary) hypertension: Secondary | ICD-10-CM | POA: Diagnosis not present

## 2019-03-18 DIAGNOSIS — M62830 Muscle spasm of back: Secondary | ICD-10-CM | POA: Diagnosis not present

## 2019-03-18 DIAGNOSIS — Z79891 Long term (current) use of opiate analgesic: Secondary | ICD-10-CM | POA: Diagnosis not present

## 2019-03-18 DIAGNOSIS — Z760 Encounter for issue of repeat prescription: Secondary | ICD-10-CM | POA: Diagnosis not present

## 2019-03-18 DIAGNOSIS — M5136 Other intervertebral disc degeneration, lumbar region: Secondary | ICD-10-CM | POA: Diagnosis not present

## 2019-03-18 DIAGNOSIS — M5412 Radiculopathy, cervical region: Secondary | ICD-10-CM | POA: Diagnosis not present

## 2019-03-18 DIAGNOSIS — G8929 Other chronic pain: Secondary | ICD-10-CM | POA: Diagnosis not present

## 2019-03-24 DIAGNOSIS — E291 Testicular hypofunction: Secondary | ICD-10-CM | POA: Diagnosis not present

## 2019-03-30 DIAGNOSIS — R69 Illness, unspecified: Secondary | ICD-10-CM | POA: Diagnosis not present

## 2019-04-01 DIAGNOSIS — R197 Diarrhea, unspecified: Secondary | ICD-10-CM | POA: Diagnosis not present

## 2019-04-01 DIAGNOSIS — R110 Nausea: Secondary | ICD-10-CM | POA: Diagnosis not present

## 2019-04-01 DIAGNOSIS — Z20828 Contact with and (suspected) exposure to other viral communicable diseases: Secondary | ICD-10-CM | POA: Diagnosis not present

## 2019-04-06 DIAGNOSIS — E1140 Type 2 diabetes mellitus with diabetic neuropathy, unspecified: Secondary | ICD-10-CM | POA: Diagnosis not present

## 2019-04-14 DIAGNOSIS — E291 Testicular hypofunction: Secondary | ICD-10-CM | POA: Diagnosis not present

## 2019-04-16 DIAGNOSIS — E1140 Type 2 diabetes mellitus with diabetic neuropathy, unspecified: Secondary | ICD-10-CM | POA: Diagnosis not present

## 2019-04-16 DIAGNOSIS — Z6841 Body Mass Index (BMI) 40.0 and over, adult: Secondary | ICD-10-CM | POA: Diagnosis not present

## 2019-04-16 DIAGNOSIS — I10 Essential (primary) hypertension: Secondary | ICD-10-CM | POA: Diagnosis not present

## 2019-04-16 DIAGNOSIS — E782 Mixed hyperlipidemia: Secondary | ICD-10-CM | POA: Diagnosis not present

## 2019-04-20 DIAGNOSIS — E119 Type 2 diabetes mellitus without complications: Secondary | ICD-10-CM | POA: Diagnosis not present

## 2019-04-20 DIAGNOSIS — M19072 Primary osteoarthritis, left ankle and foot: Secondary | ICD-10-CM | POA: Diagnosis not present

## 2019-04-20 DIAGNOSIS — B351 Tinea unguium: Secondary | ICD-10-CM | POA: Diagnosis not present

## 2019-04-20 DIAGNOSIS — Z7984 Long term (current) use of oral hypoglycemic drugs: Secondary | ICD-10-CM | POA: Diagnosis not present

## 2019-04-21 DIAGNOSIS — Z6839 Body mass index (BMI) 39.0-39.9, adult: Secondary | ICD-10-CM | POA: Diagnosis not present

## 2019-04-21 DIAGNOSIS — E1140 Type 2 diabetes mellitus with diabetic neuropathy, unspecified: Secondary | ICD-10-CM | POA: Diagnosis not present

## 2019-04-21 DIAGNOSIS — E782 Mixed hyperlipidemia: Secondary | ICD-10-CM | POA: Diagnosis not present

## 2019-05-04 DIAGNOSIS — E291 Testicular hypofunction: Secondary | ICD-10-CM | POA: Diagnosis not present

## 2019-05-04 DIAGNOSIS — R69 Illness, unspecified: Secondary | ICD-10-CM | POA: Diagnosis not present

## 2019-05-05 DIAGNOSIS — Z6839 Body mass index (BMI) 39.0-39.9, adult: Secondary | ICD-10-CM | POA: Diagnosis not present

## 2019-05-05 DIAGNOSIS — J029 Acute pharyngitis, unspecified: Secondary | ICD-10-CM | POA: Diagnosis not present

## 2019-05-06 DIAGNOSIS — R768 Other specified abnormal immunological findings in serum: Secondary | ICD-10-CM | POA: Diagnosis not present

## 2019-05-06 DIAGNOSIS — M8580 Other specified disorders of bone density and structure, unspecified site: Secondary | ICD-10-CM | POA: Diagnosis not present

## 2019-05-06 DIAGNOSIS — M1990 Unspecified osteoarthritis, unspecified site: Secondary | ICD-10-CM | POA: Diagnosis not present

## 2019-05-06 DIAGNOSIS — Z20828 Contact with and (suspected) exposure to other viral communicable diseases: Secondary | ICD-10-CM | POA: Diagnosis not present

## 2019-05-06 DIAGNOSIS — M25561 Pain in right knee: Secondary | ICD-10-CM | POA: Diagnosis not present

## 2019-05-06 DIAGNOSIS — M19049 Primary osteoarthritis, unspecified hand: Secondary | ICD-10-CM | POA: Diagnosis not present

## 2019-05-10 DIAGNOSIS — R69 Illness, unspecified: Secondary | ICD-10-CM | POA: Diagnosis not present

## 2019-05-10 DIAGNOSIS — J029 Acute pharyngitis, unspecified: Secondary | ICD-10-CM | POA: Diagnosis not present

## 2019-05-10 DIAGNOSIS — K146 Glossodynia: Secondary | ICD-10-CM | POA: Diagnosis not present

## 2019-05-10 DIAGNOSIS — Z6839 Body mass index (BMI) 39.0-39.9, adult: Secondary | ICD-10-CM | POA: Diagnosis not present

## 2019-05-17 DIAGNOSIS — E782 Mixed hyperlipidemia: Secondary | ICD-10-CM | POA: Diagnosis not present

## 2019-05-17 DIAGNOSIS — I10 Essential (primary) hypertension: Secondary | ICD-10-CM | POA: Diagnosis not present

## 2019-05-17 DIAGNOSIS — E1140 Type 2 diabetes mellitus with diabetic neuropathy, unspecified: Secondary | ICD-10-CM | POA: Diagnosis not present

## 2019-05-17 DIAGNOSIS — Z6839 Body mass index (BMI) 39.0-39.9, adult: Secondary | ICD-10-CM | POA: Diagnosis not present

## 2019-05-24 DIAGNOSIS — Z9889 Other specified postprocedural states: Secondary | ICD-10-CM | POA: Diagnosis not present

## 2019-05-24 DIAGNOSIS — H6123 Impacted cerumen, bilateral: Secondary | ICD-10-CM | POA: Diagnosis not present

## 2019-05-24 DIAGNOSIS — H61303 Acquired stenosis of external ear canal, unspecified, bilateral: Secondary | ICD-10-CM | POA: Diagnosis not present

## 2019-05-26 DIAGNOSIS — M8589 Other specified disorders of bone density and structure, multiple sites: Secondary | ICD-10-CM | POA: Diagnosis not present

## 2019-05-26 DIAGNOSIS — M8588 Other specified disorders of bone density and structure, other site: Secondary | ICD-10-CM | POA: Diagnosis not present

## 2019-05-28 DIAGNOSIS — E291 Testicular hypofunction: Secondary | ICD-10-CM | POA: Diagnosis not present

## 2019-06-16 DIAGNOSIS — E782 Mixed hyperlipidemia: Secondary | ICD-10-CM | POA: Diagnosis not present

## 2019-06-16 DIAGNOSIS — E1140 Type 2 diabetes mellitus with diabetic neuropathy, unspecified: Secondary | ICD-10-CM | POA: Diagnosis not present

## 2019-06-16 DIAGNOSIS — I10 Essential (primary) hypertension: Secondary | ICD-10-CM | POA: Diagnosis not present

## 2019-06-22 DIAGNOSIS — Z760 Encounter for issue of repeat prescription: Secondary | ICD-10-CM | POA: Diagnosis not present

## 2019-06-22 DIAGNOSIS — K5903 Drug induced constipation: Secondary | ICD-10-CM

## 2019-06-22 DIAGNOSIS — M5136 Other intervertebral disc degeneration, lumbar region: Secondary | ICD-10-CM | POA: Diagnosis not present

## 2019-06-22 DIAGNOSIS — G8929 Other chronic pain: Secondary | ICD-10-CM | POA: Diagnosis not present

## 2019-06-22 DIAGNOSIS — M4722 Other spondylosis with radiculopathy, cervical region: Secondary | ICD-10-CM | POA: Diagnosis not present

## 2019-06-22 DIAGNOSIS — T402X5A Adverse effect of other opioids, initial encounter: Secondary | ICD-10-CM | POA: Diagnosis not present

## 2019-06-22 DIAGNOSIS — Z79891 Long term (current) use of opiate analgesic: Secondary | ICD-10-CM | POA: Diagnosis not present

## 2019-06-22 HISTORY — DX: Drug induced constipation: K59.03

## 2019-06-24 DIAGNOSIS — E1140 Type 2 diabetes mellitus with diabetic neuropathy, unspecified: Secondary | ICD-10-CM | POA: Diagnosis not present

## 2019-06-24 DIAGNOSIS — E782 Mixed hyperlipidemia: Secondary | ICD-10-CM | POA: Diagnosis not present

## 2019-06-25 DIAGNOSIS — Z1331 Encounter for screening for depression: Secondary | ICD-10-CM | POA: Diagnosis not present

## 2019-06-25 DIAGNOSIS — Z0000 Encounter for general adult medical examination without abnormal findings: Secondary | ICD-10-CM | POA: Diagnosis not present

## 2019-06-25 DIAGNOSIS — Z139 Encounter for screening, unspecified: Secondary | ICD-10-CM | POA: Diagnosis not present

## 2019-06-25 DIAGNOSIS — Z7189 Other specified counseling: Secondary | ICD-10-CM | POA: Diagnosis not present

## 2019-06-25 DIAGNOSIS — Z136 Encounter for screening for cardiovascular disorders: Secondary | ICD-10-CM | POA: Diagnosis not present

## 2019-06-25 DIAGNOSIS — Z1339 Encounter for screening examination for other mental health and behavioral disorders: Secondary | ICD-10-CM | POA: Diagnosis not present

## 2019-07-02 DIAGNOSIS — I152 Hypertension secondary to endocrine disorders: Secondary | ICD-10-CM | POA: Diagnosis not present

## 2019-07-02 DIAGNOSIS — E782 Mixed hyperlipidemia: Secondary | ICD-10-CM | POA: Diagnosis not present

## 2019-07-02 DIAGNOSIS — E1159 Type 2 diabetes mellitus with other circulatory complications: Secondary | ICD-10-CM | POA: Diagnosis not present

## 2019-07-02 DIAGNOSIS — E1140 Type 2 diabetes mellitus with diabetic neuropathy, unspecified: Secondary | ICD-10-CM | POA: Diagnosis not present

## 2019-07-08 DIAGNOSIS — M810 Age-related osteoporosis without current pathological fracture: Secondary | ICD-10-CM | POA: Diagnosis not present

## 2019-07-12 DIAGNOSIS — R69 Illness, unspecified: Secondary | ICD-10-CM | POA: Diagnosis not present

## 2019-07-16 DIAGNOSIS — E1159 Type 2 diabetes mellitus with other circulatory complications: Secondary | ICD-10-CM | POA: Diagnosis not present

## 2019-07-16 DIAGNOSIS — I152 Hypertension secondary to endocrine disorders: Secondary | ICD-10-CM | POA: Diagnosis not present

## 2019-07-16 DIAGNOSIS — E782 Mixed hyperlipidemia: Secondary | ICD-10-CM | POA: Diagnosis not present

## 2019-07-16 DIAGNOSIS — E1140 Type 2 diabetes mellitus with diabetic neuropathy, unspecified: Secondary | ICD-10-CM | POA: Diagnosis not present

## 2019-07-22 DIAGNOSIS — E291 Testicular hypofunction: Secondary | ICD-10-CM | POA: Diagnosis not present

## 2019-07-23 DIAGNOSIS — N529 Male erectile dysfunction, unspecified: Secondary | ICD-10-CM | POA: Diagnosis not present

## 2019-07-23 DIAGNOSIS — E291 Testicular hypofunction: Secondary | ICD-10-CM | POA: Diagnosis not present

## 2019-07-23 DIAGNOSIS — Z6839 Body mass index (BMI) 39.0-39.9, adult: Secondary | ICD-10-CM | POA: Diagnosis not present

## 2019-07-27 DIAGNOSIS — H524 Presbyopia: Secondary | ICD-10-CM | POA: Diagnosis not present

## 2019-07-27 DIAGNOSIS — H25813 Combined forms of age-related cataract, bilateral: Secondary | ICD-10-CM | POA: Diagnosis not present

## 2019-07-27 DIAGNOSIS — E119 Type 2 diabetes mellitus without complications: Secondary | ICD-10-CM | POA: Diagnosis not present

## 2019-07-29 DIAGNOSIS — Z7984 Long term (current) use of oral hypoglycemic drugs: Secondary | ICD-10-CM | POA: Diagnosis not present

## 2019-07-29 DIAGNOSIS — E119 Type 2 diabetes mellitus without complications: Secondary | ICD-10-CM | POA: Diagnosis not present

## 2019-07-29 DIAGNOSIS — B351 Tinea unguium: Secondary | ICD-10-CM | POA: Diagnosis not present

## 2019-07-29 DIAGNOSIS — M19072 Primary osteoarthritis, left ankle and foot: Secondary | ICD-10-CM | POA: Diagnosis not present

## 2019-08-09 DIAGNOSIS — L814 Other melanin hyperpigmentation: Secondary | ICD-10-CM | POA: Diagnosis not present

## 2019-08-09 DIAGNOSIS — B079 Viral wart, unspecified: Secondary | ICD-10-CM | POA: Diagnosis not present

## 2019-08-09 DIAGNOSIS — D485 Neoplasm of uncertain behavior of skin: Secondary | ICD-10-CM | POA: Diagnosis not present

## 2019-08-11 DIAGNOSIS — E291 Testicular hypofunction: Secondary | ICD-10-CM | POA: Diagnosis not present

## 2019-08-11 DIAGNOSIS — Z6838 Body mass index (BMI) 38.0-38.9, adult: Secondary | ICD-10-CM | POA: Diagnosis not present

## 2019-08-11 DIAGNOSIS — N529 Male erectile dysfunction, unspecified: Secondary | ICD-10-CM | POA: Diagnosis not present

## 2019-08-12 DIAGNOSIS — M79671 Pain in right foot: Secondary | ICD-10-CM | POA: Diagnosis not present

## 2019-08-12 DIAGNOSIS — M1990 Unspecified osteoarthritis, unspecified site: Secondary | ICD-10-CM | POA: Diagnosis not present

## 2019-08-12 DIAGNOSIS — R768 Other specified abnormal immunological findings in serum: Secondary | ICD-10-CM | POA: Diagnosis not present

## 2019-08-12 DIAGNOSIS — M8580 Other specified disorders of bone density and structure, unspecified site: Secondary | ICD-10-CM | POA: Diagnosis not present

## 2019-08-12 DIAGNOSIS — M19049 Primary osteoarthritis, unspecified hand: Secondary | ICD-10-CM | POA: Diagnosis not present

## 2019-08-15 DIAGNOSIS — Z6839 Body mass index (BMI) 39.0-39.9, adult: Secondary | ICD-10-CM | POA: Diagnosis not present

## 2019-08-15 DIAGNOSIS — E782 Mixed hyperlipidemia: Secondary | ICD-10-CM | POA: Diagnosis not present

## 2019-08-15 DIAGNOSIS — I152 Hypertension secondary to endocrine disorders: Secondary | ICD-10-CM | POA: Diagnosis not present

## 2019-08-15 DIAGNOSIS — E1159 Type 2 diabetes mellitus with other circulatory complications: Secondary | ICD-10-CM | POA: Diagnosis not present

## 2019-08-21 DIAGNOSIS — Z20828 Contact with and (suspected) exposure to other viral communicable diseases: Secondary | ICD-10-CM | POA: Diagnosis not present

## 2019-08-21 DIAGNOSIS — K591 Functional diarrhea: Secondary | ICD-10-CM | POA: Diagnosis not present

## 2019-08-21 DIAGNOSIS — R6883 Chills (without fever): Secondary | ICD-10-CM | POA: Diagnosis not present

## 2019-08-24 DIAGNOSIS — L309 Dermatitis, unspecified: Secondary | ICD-10-CM | POA: Diagnosis not present

## 2019-08-25 DIAGNOSIS — M79644 Pain in right finger(s): Secondary | ICD-10-CM | POA: Diagnosis not present

## 2019-08-25 DIAGNOSIS — B078 Other viral warts: Secondary | ICD-10-CM | POA: Diagnosis not present

## 2019-08-25 DIAGNOSIS — T23221A Burn of second degree of single right finger (nail) except thumb, initial encounter: Secondary | ICD-10-CM

## 2019-08-25 DIAGNOSIS — M6740 Ganglion, unspecified site: Secondary | ICD-10-CM | POA: Diagnosis not present

## 2019-08-25 DIAGNOSIS — B079 Viral wart, unspecified: Secondary | ICD-10-CM

## 2019-08-25 DIAGNOSIS — M180 Bilateral primary osteoarthritis of first carpometacarpal joints: Secondary | ICD-10-CM | POA: Insufficient documentation

## 2019-08-25 HISTORY — DX: Bilateral primary osteoarthritis of first carpometacarpal joints: M18.0

## 2019-08-25 HISTORY — DX: Viral wart, unspecified: B07.9

## 2019-08-25 HISTORY — DX: Burn of second degree of single right finger (nail) except thumb, initial encounter: T23.221A

## 2019-08-31 DIAGNOSIS — R69 Illness, unspecified: Secondary | ICD-10-CM | POA: Diagnosis not present

## 2019-09-07 DIAGNOSIS — Z6841 Body Mass Index (BMI) 40.0 and over, adult: Secondary | ICD-10-CM | POA: Diagnosis not present

## 2019-09-07 DIAGNOSIS — R69 Illness, unspecified: Secondary | ICD-10-CM | POA: Diagnosis not present

## 2019-09-07 DIAGNOSIS — G4733 Obstructive sleep apnea (adult) (pediatric): Secondary | ICD-10-CM | POA: Diagnosis not present

## 2019-09-07 DIAGNOSIS — Z9989 Dependence on other enabling machines and devices: Secondary | ICD-10-CM | POA: Diagnosis not present

## 2019-09-13 DIAGNOSIS — M71341 Other bursal cyst, right hand: Secondary | ICD-10-CM | POA: Diagnosis not present

## 2019-09-13 DIAGNOSIS — M180 Bilateral primary osteoarthritis of first carpometacarpal joints: Secondary | ICD-10-CM | POA: Diagnosis not present

## 2019-09-14 DIAGNOSIS — H2511 Age-related nuclear cataract, right eye: Secondary | ICD-10-CM | POA: Diagnosis not present

## 2019-09-15 DIAGNOSIS — E291 Testicular hypofunction: Secondary | ICD-10-CM | POA: Diagnosis not present

## 2019-09-15 DIAGNOSIS — E1159 Type 2 diabetes mellitus with other circulatory complications: Secondary | ICD-10-CM | POA: Diagnosis not present

## 2019-09-15 DIAGNOSIS — I152 Hypertension secondary to endocrine disorders: Secondary | ICD-10-CM | POA: Diagnosis not present

## 2019-09-15 DIAGNOSIS — E782 Mixed hyperlipidemia: Secondary | ICD-10-CM | POA: Diagnosis not present

## 2019-09-21 DIAGNOSIS — T402X5D Adverse effect of other opioids, subsequent encounter: Secondary | ICD-10-CM | POA: Diagnosis not present

## 2019-09-21 DIAGNOSIS — Z79891 Long term (current) use of opiate analgesic: Secondary | ICD-10-CM | POA: Diagnosis not present

## 2019-09-21 DIAGNOSIS — M5412 Radiculopathy, cervical region: Secondary | ICD-10-CM | POA: Diagnosis not present

## 2019-09-21 DIAGNOSIS — M5136 Other intervertebral disc degeneration, lumbar region: Secondary | ICD-10-CM | POA: Diagnosis not present

## 2019-09-21 DIAGNOSIS — K5903 Drug induced constipation: Secondary | ICD-10-CM | POA: Diagnosis not present

## 2019-09-21 DIAGNOSIS — G8929 Other chronic pain: Secondary | ICD-10-CM | POA: Diagnosis not present

## 2019-09-21 DIAGNOSIS — Z79899 Other long term (current) drug therapy: Secondary | ICD-10-CM | POA: Diagnosis not present

## 2019-09-21 DIAGNOSIS — Z760 Encounter for issue of repeat prescription: Secondary | ICD-10-CM | POA: Diagnosis not present

## 2019-10-02 DIAGNOSIS — K591 Functional diarrhea: Secondary | ICD-10-CM | POA: Diagnosis not present

## 2019-10-02 DIAGNOSIS — B9789 Other viral agents as the cause of diseases classified elsewhere: Secondary | ICD-10-CM | POA: Diagnosis not present

## 2019-10-05 DIAGNOSIS — R69 Illness, unspecified: Secondary | ICD-10-CM | POA: Diagnosis not present

## 2019-10-15 DIAGNOSIS — I152 Hypertension secondary to endocrine disorders: Secondary | ICD-10-CM | POA: Diagnosis not present

## 2019-10-15 DIAGNOSIS — E782 Mixed hyperlipidemia: Secondary | ICD-10-CM | POA: Diagnosis not present

## 2019-10-15 DIAGNOSIS — E1159 Type 2 diabetes mellitus with other circulatory complications: Secondary | ICD-10-CM | POA: Diagnosis not present

## 2019-10-18 DIAGNOSIS — E291 Testicular hypofunction: Secondary | ICD-10-CM | POA: Diagnosis not present

## 2019-10-19 DIAGNOSIS — G8929 Other chronic pain: Secondary | ICD-10-CM | POA: Insufficient documentation

## 2019-10-19 DIAGNOSIS — M25562 Pain in left knee: Secondary | ICD-10-CM | POA: Diagnosis not present

## 2019-10-19 DIAGNOSIS — S8992XA Unspecified injury of left lower leg, initial encounter: Secondary | ICD-10-CM | POA: Diagnosis not present

## 2019-10-19 HISTORY — DX: Other chronic pain: G89.29

## 2019-10-20 DIAGNOSIS — M5136 Other intervertebral disc degeneration, lumbar region: Secondary | ICD-10-CM | POA: Diagnosis not present

## 2019-10-20 DIAGNOSIS — M47816 Spondylosis without myelopathy or radiculopathy, lumbar region: Secondary | ICD-10-CM | POA: Diagnosis not present

## 2019-10-20 DIAGNOSIS — M5126 Other intervertebral disc displacement, lumbar region: Secondary | ICD-10-CM | POA: Diagnosis not present

## 2019-10-20 DIAGNOSIS — M5127 Other intervertebral disc displacement, lumbosacral region: Secondary | ICD-10-CM | POA: Diagnosis not present

## 2019-10-22 DIAGNOSIS — N179 Acute kidney failure, unspecified: Secondary | ICD-10-CM | POA: Diagnosis not present

## 2019-10-22 DIAGNOSIS — R29701 NIHSS score 1: Secondary | ICD-10-CM | POA: Diagnosis not present

## 2019-10-22 DIAGNOSIS — R0600 Dyspnea, unspecified: Secondary | ICD-10-CM | POA: Diagnosis not present

## 2019-10-22 DIAGNOSIS — E1169 Type 2 diabetes mellitus with other specified complication: Secondary | ICD-10-CM | POA: Diagnosis not present

## 2019-10-22 DIAGNOSIS — Z6836 Body mass index (BMI) 36.0-36.9, adult: Secondary | ICD-10-CM | POA: Diagnosis not present

## 2019-10-22 DIAGNOSIS — I4891 Unspecified atrial fibrillation: Secondary | ICD-10-CM | POA: Diagnosis not present

## 2019-10-22 DIAGNOSIS — M5136 Other intervertebral disc degeneration, lumbar region: Secondary | ICD-10-CM | POA: Diagnosis not present

## 2019-10-22 DIAGNOSIS — M180 Bilateral primary osteoarthritis of first carpometacarpal joints: Secondary | ICD-10-CM | POA: Diagnosis not present

## 2019-10-22 DIAGNOSIS — R55 Syncope and collapse: Secondary | ICD-10-CM | POA: Diagnosis not present

## 2019-10-22 DIAGNOSIS — E785 Hyperlipidemia, unspecified: Secondary | ICD-10-CM | POA: Diagnosis not present

## 2019-10-22 DIAGNOSIS — I10 Essential (primary) hypertension: Secondary | ICD-10-CM | POA: Diagnosis not present

## 2019-10-22 DIAGNOSIS — R5383 Other fatigue: Secondary | ICD-10-CM | POA: Diagnosis not present

## 2019-10-22 DIAGNOSIS — E669 Obesity, unspecified: Secondary | ICD-10-CM | POA: Diagnosis not present

## 2019-10-22 DIAGNOSIS — R479 Unspecified speech disturbances: Secondary | ICD-10-CM | POA: Diagnosis not present

## 2019-10-23 DIAGNOSIS — E785 Hyperlipidemia, unspecified: Secondary | ICD-10-CM | POA: Diagnosis not present

## 2019-10-23 DIAGNOSIS — I4891 Unspecified atrial fibrillation: Secondary | ICD-10-CM | POA: Diagnosis not present

## 2019-10-23 DIAGNOSIS — E1169 Type 2 diabetes mellitus with other specified complication: Secondary | ICD-10-CM | POA: Diagnosis not present

## 2019-10-23 DIAGNOSIS — R079 Chest pain, unspecified: Secondary | ICD-10-CM | POA: Diagnosis not present

## 2019-10-23 DIAGNOSIS — I10 Essential (primary) hypertension: Secondary | ICD-10-CM | POA: Diagnosis not present

## 2019-10-23 DIAGNOSIS — R55 Syncope and collapse: Secondary | ICD-10-CM | POA: Diagnosis not present

## 2019-10-23 DIAGNOSIS — R0600 Dyspnea, unspecified: Secondary | ICD-10-CM | POA: Diagnosis not present

## 2019-10-24 DIAGNOSIS — I10 Essential (primary) hypertension: Secondary | ICD-10-CM

## 2019-10-24 DIAGNOSIS — E1169 Type 2 diabetes mellitus with other specified complication: Secondary | ICD-10-CM | POA: Diagnosis not present

## 2019-10-24 DIAGNOSIS — R0600 Dyspnea, unspecified: Secondary | ICD-10-CM | POA: Diagnosis not present

## 2019-10-24 DIAGNOSIS — E785 Hyperlipidemia, unspecified: Secondary | ICD-10-CM

## 2019-10-24 DIAGNOSIS — I4891 Unspecified atrial fibrillation: Secondary | ICD-10-CM | POA: Diagnosis not present

## 2019-10-24 DIAGNOSIS — R079 Chest pain, unspecified: Secondary | ICD-10-CM | POA: Diagnosis not present

## 2019-10-25 ENCOUNTER — Telehealth: Payer: Self-pay | Admitting: Cardiology

## 2019-10-25 DIAGNOSIS — R0600 Dyspnea, unspecified: Secondary | ICD-10-CM | POA: Diagnosis not present

## 2019-10-25 DIAGNOSIS — I4891 Unspecified atrial fibrillation: Secondary | ICD-10-CM | POA: Diagnosis not present

## 2019-10-25 DIAGNOSIS — E1169 Type 2 diabetes mellitus with other specified complication: Secondary | ICD-10-CM | POA: Diagnosis not present

## 2019-10-25 DIAGNOSIS — E785 Hyperlipidemia, unspecified: Secondary | ICD-10-CM | POA: Diagnosis not present

## 2019-10-25 DIAGNOSIS — R079 Chest pain, unspecified: Secondary | ICD-10-CM

## 2019-10-25 DIAGNOSIS — I10 Essential (primary) hypertension: Secondary | ICD-10-CM | POA: Diagnosis not present

## 2019-10-25 NOTE — Telephone Encounter (Signed)
Follow up  Pt c/o medication issue:  1. Name of Medication: Eliquis   2. How are you currently taking this medication (dosage and times per day)?   3. Are you having a reaction (difficulty breathing--STAT)?   4. What is your medication issue? Pt called, he said while he's in [facility] Dr. Geraldo Pitter ordered eliquis he checked the side effects and it says there not to take if you get spinal punctures. He gets several corticosteroids shots for his herniated disc and wasn't sure if he needs to take this drug  Please advise

## 2019-10-25 NOTE — Telephone Encounter (Signed)
How do you advise on the Eliquis?  PA has been done for Bystolic.

## 2019-10-25 NOTE — Telephone Encounter (Signed)
Pt c/o medication issue:  1. Name of Medication: BYSTOLIC 10 MG tablet  2. How are you currently taking this medication (dosage and times per day)? 1 tablet twice a day  3. Are you having a reaction (difficulty breathing--STAT)? no  4. What is your medication issue? Dr. Pietro Cassis calling from [facility] stating he sent a prior authorization for the patient's BYSTOLIC 10 MG tablet to the office to be taken care off.

## 2019-10-26 DIAGNOSIS — M4722 Other spondylosis with radiculopathy, cervical region: Secondary | ICD-10-CM | POA: Diagnosis not present

## 2019-10-26 DIAGNOSIS — M5116 Intervertebral disc disorders with radiculopathy, lumbar region: Secondary | ICD-10-CM | POA: Diagnosis not present

## 2019-10-26 DIAGNOSIS — K5903 Drug induced constipation: Secondary | ICD-10-CM | POA: Diagnosis not present

## 2019-10-26 DIAGNOSIS — T402X5D Adverse effect of other opioids, subsequent encounter: Secondary | ICD-10-CM | POA: Diagnosis not present

## 2019-10-26 DIAGNOSIS — Z79899 Other long term (current) drug therapy: Secondary | ICD-10-CM | POA: Diagnosis not present

## 2019-10-26 DIAGNOSIS — G8929 Other chronic pain: Secondary | ICD-10-CM | POA: Diagnosis not present

## 2019-10-26 DIAGNOSIS — M5136 Other intervertebral disc degeneration, lumbar region: Secondary | ICD-10-CM | POA: Diagnosis not present

## 2019-10-26 DIAGNOSIS — Z79891 Long term (current) use of opiate analgesic: Secondary | ICD-10-CM | POA: Diagnosis not present

## 2019-10-26 DIAGNOSIS — Z760 Encounter for issue of repeat prescription: Secondary | ICD-10-CM | POA: Diagnosis not present

## 2019-10-26 NOTE — Telephone Encounter (Signed)
That is true for all blood thinners however they can be stopped for couple of days before he gets the shot.  If he does not go on the blood thinners he risk having a stroke.  So recommendation is to go on blood thinners just to clarify.

## 2019-10-26 NOTE — Telephone Encounter (Signed)
PT was given the recommendations as set by Dr. Geraldo Pitter. Pt verbalized understanding and had no additional questions.

## 2019-10-27 DIAGNOSIS — I4891 Unspecified atrial fibrillation: Secondary | ICD-10-CM | POA: Diagnosis not present

## 2019-10-27 DIAGNOSIS — Z7689 Persons encountering health services in other specified circumstances: Secondary | ICD-10-CM | POA: Diagnosis not present

## 2019-10-27 DIAGNOSIS — G4733 Obstructive sleep apnea (adult) (pediatric): Secondary | ICD-10-CM | POA: Diagnosis not present

## 2019-11-01 ENCOUNTER — Other Ambulatory Visit: Payer: Self-pay

## 2019-11-01 DIAGNOSIS — E1140 Type 2 diabetes mellitus with diabetic neuropathy, unspecified: Secondary | ICD-10-CM | POA: Diagnosis not present

## 2019-11-01 DIAGNOSIS — E782 Mixed hyperlipidemia: Secondary | ICD-10-CM | POA: Diagnosis not present

## 2019-11-01 DIAGNOSIS — E1159 Type 2 diabetes mellitus with other circulatory complications: Secondary | ICD-10-CM | POA: Diagnosis not present

## 2019-11-02 ENCOUNTER — Ambulatory Visit: Payer: Medicare HMO | Admitting: Cardiology

## 2019-11-02 ENCOUNTER — Other Ambulatory Visit: Payer: Self-pay

## 2019-11-02 ENCOUNTER — Encounter: Payer: Self-pay | Admitting: Cardiology

## 2019-11-02 DIAGNOSIS — E088 Diabetes mellitus due to underlying condition with unspecified complications: Secondary | ICD-10-CM

## 2019-11-02 DIAGNOSIS — I10 Essential (primary) hypertension: Secondary | ICD-10-CM

## 2019-11-02 DIAGNOSIS — I4819 Other persistent atrial fibrillation: Secondary | ICD-10-CM | POA: Diagnosis not present

## 2019-11-02 HISTORY — DX: Essential (primary) hypertension: I10

## 2019-11-02 HISTORY — DX: Diabetes mellitus due to underlying condition with unspecified complications: E08.8

## 2019-11-02 NOTE — Patient Instructions (Signed)
Medication Instructions:  No medication changes *If you need a refill on your cardiac medications before your next appointment, please call your pharmacy*   Lab Work: None ordered If you have labs (blood work) drawn today and your tests are completely normal, you will receive your results only by: Marland Kitchen MyChart Message (if you have MyChart) OR . A paper copy in the mail If you have any lab test that is abnormal or we need to change your treatment, we will call you to review the results.   Testing/Procedures: None ordered   Follow-Up: At [facility], you and your health needs are our priority.  As part of our continuing mission to provide you with exceptional heart care, we have created designated Provider Care Teams.  These Care Teams include your primary Cardiologist (physician) and Advanced Practice Providers (APPs -  Physician Assistants and Nurse Practitioners) who all work together to provide you with the care you need, when you need it.  We recommend signing up for the patient portal called "MyChart".  Sign up information is provided on this After Visit Summary.  MyChart is used to connect with patients for Virtual Visits (Telemedicine).  Patients are able to view lab/test results, encounter notes, upcoming appointments, etc.  Non-urgent messages can be sent to your provider as well.   To learn more about what you can do with MyChart, go to [email or web address].    Your next appointment:   3 week(s)  The format for your next appointment:   In Person  Provider:   Jyl Heinz, MD   Other Instructions NA

## 2019-11-02 NOTE — Progress Notes (Signed)
Cardiology Office Note:    Date:  11/02/2019   ID:  Peter Buck, DOB May 12, 1956, MRN [id number]  PCP:  Peter Kern, PA  Cardiologist:  Peter Lindau, MD   Referring MD: Peter Buck, [place]    ASSESSMENT:    1. Persistent atrial fibrillation ([place])   2. Essential hypertension   3. Diabetes mellitus due to underlying condition with unspecified complications ([place])    PLAN:    In order of problems listed above:  1. Primary prevention stressed with the patient.  Importance of compliance with diet and medication stressed and he vocalized understanding. 2. Atrial fibrillation:I discussed with the patient atrial fibrillation, disease process. Management and therapy including rate and rhythm control, anticoagulation benefits and potential risks were discussed extensively with the patient. Patient had multiple questions which were answered to patient's satisfaction.  The patient continues to be in atrial fibrillation.  He is taking anticoagulation appropriately.  In about 3 weeks from now I will schedule him for cardioversion.  Before that she will come here for a Chem-7 and an EKG. 3. Essential hypertension: Blood pressure stable.  Lifestyle modification was urged. 4. Diabetes mellitus: Managed by primary care physician and diet was emphasized.  Diet was emphasized transitional management primary care physician.  5. Obesity: Weight reduction was stressed and diet was emphasized. 6. He will be seen in follow-up appointment in 6 weeks or earlier if he has any concerns.   Medication Adjustments/Labs and Tests Ordered: Current medicines are reviewed at length with the patient today.  Concerns regarding medicines are outlined above.  No orders of the defined types were placed in this encounter.  No orders of the defined types were placed in this encounter.    Chief Complaint  Patient presents with  . New Patient (Initial Visit)  . Hospitalization Follow-up     History of Present  Illness:    Peter Buck is a 64 y.o. male.  Patient has past medical history of essential hypertension dyslipidemia and diabetes mellitus.  He recently was in and out of hospital with atrial fibrillation and I evaluated him.  Subsequently he was on anticoagulation.  He denies any problems at this time and takes care of activities of daily living.  He leads a sedentary lifestyle.  He mentions to me that he has gained weight.  At the time of my evaluation, the patient is alert awake oriented and in no distress.  Past Medical History:  Diagnosis Date  . Aftercare following surgery 04/23/2018  . Anemia   . Arthritis   . Arthritis, midfoot 12/05/2016  . Asthma   . Bipolar affective disorder, depressed ([place]) 02/13/2016  . Bipolar disorder ([place])   . Body mass index (bmi) 38.0-38.9, adult 09/16/2017  . Borderline personality disorder ([place]) 04/29/2016  . Cervical radiculopathy 02/02/2019   Last Assessment & Plan:  Formatting of this note might be different from the original. MRI of the cervical spine shows cervical spondylosis with facet arthrosis most notable for mild to moderate left neural foraminal stenosis at C3-4. Per patient, nerve conduction studies showed right C7-8 radiculopathy.   Continue with current regimen as outlined in lumbar degenerative disc plan.  . Chronic pain of left knee 10/19/2019  . Chronic pain syndrome 02/02/2019  . Comprehensive diabetic foot examination, type 2 DM, encounter for ([place]) 12/25/2015  . DDD (degenerative disc disease), lumbar 11/25/2017   Last Assessment & Plan:  Formatting of this note might be different from the original. 64 year old male with chronic, predominantly left-sided  low back pain with radiation into the left hip and groin.  Today we reviewed his recently updated lumbar MRI which shows impingement of the left L2 nerve root due to broad-based disc bulging lateralizing to the left and projecting into the far left lateral   . Diabetes mellitus without  complication ([place])   . Facet arthropathy, cervical 02/02/2019  . Foraminal stenosis of cervical region 02/02/2019  . GERD (gastroesophageal reflux disease)   . Hammertoe 04/26/2014  . Hypertension   . 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 Low back pain 11/25/2017   Last Assessment & Plan:  [name] is a 65 y.o. male with Low back pain, and acute on chronic low back pain with left hip, knee, and foot pain.  Today I will treat with the patient's current medication of tramadol and diclofenac and add relafen (temporarily d/c diclofenac) for 10 days and increase lyrica. He will continue with his current appt with his neurosurgery.  [name] will return to   . Muscle spasm of back 02/17/2018   Last Assessment & Plan:  Formatting of this note might be different from the original. Continue with tizanidine 4 mg as needed for muscle spasming up to 3 times daily.  . Neuroforaminal stenosis of lumbar spine 12/17/2018  . Onychomycosis due to dermatophyte 12/25/2015  . Pain associated with accessory navicular bone of foot, left 05/25/2018  . Pain medication agreement 10/16/2018   Last Assessment & Plan:  Formatting of this note might be different from the original. Review of prior UDS results are within normal limits.  [place] controlled substance registry reviewed and there were no inconsistencies noted.  . Plantar fasciitis 01/02/2017  . Primary osteoarthritis of both first carpometacarpal joints 08/25/2019  . Second degree burn of finger of right hand 08/25/2019  . Sleep apnea   . Therapeutic opioid induced constipation 06/22/2019   Last Assessment & Plan:  Formatting of this note might be different from the original. Continue amitiza 76mcg b.i.d.  . [place] 08/25/2019    Past Surgical History:  Procedure Laterality Date  . APPENDECTOMY    . CHOLECYSTECTOMY    . INNER EAR SURGERY    . KNEE ARTHROSCOPY      Current Medications: Current Meds  Medication Sig  . acetaminophen (TYLENOL) 650 MG CR tablet  Take 650 mg by mouth every 8 (eight) hours as needed. Or take 650 mg every 5 hours for severe pain  . albuterol (VENTOLIN HFA) 108 (90 Base) MCG/ACT inhaler Inhale 2 puffs into the lungs every 6 (six) hours as needed.  . AMITIZA 24 MCG capsule Take 24 mcg by mouth 2 (two) times daily.  . busPIRone (BUSPAR) 15 MG tablet Take 15 mg by mouth 2 (two) times daily.  Marland Kitchen BYDUREON BCISE 2 MG/0.85ML AUIJ   . Calcium Citrate-Vitamin D 200-250 MG-UNIT TABS Take 1 tablet by mouth at bedtime.  . chlorhexidine (PERIDEX) 0.12 % solution PLEASE SEE ATTACHED FOR DETAILED DIRECTIONS  . cloNIDine (CATAPRES) 0.1 MG tablet   . diclofenac Sodium (VOLTAREN) 1 % GEL Apply 1 application topically in the morning, at noon, in the evening, and at bedtime.  . digoxin (LANOXIN) 0.25 MG tablet Take 250 mcg by mouth daily.  Marland Kitchen ELIQUIS 5 MG TABS tablet Take 5 mg by mouth 2 (two) times daily.  Marland Kitchen HYDROcodone-acetaminophen (NORCO/VICODIN) 5-325 MG tablet Take 1 tablet by mouth 2 (two) times daily as needed.  . lovastatin (MEVACOR) 10 MG tablet Take 10 mg by mouth in the morning.  Marland Kitchen  metFORMIN (GLUCOPHAGE) 1000 MG tablet Take 1,000 mg by mouth 2 (two) times daily.  . mometasone (NASONEX) 50 MCG/ACT nasal spray Place 1 spray into the nose 2 (two) times daily.  Marland Kitchen MOVANTIK 25 MG TABS tablet Take 1 tablet by mouth in the morning. 1 hour prior to food  . nebivolol (BYSTOLIC) 10 MG tablet Take 10 mg by mouth at bedtime.  . ondansetron (ZOFRAN-ODT) 8 MG disintegrating tablet Take 8 mg by mouth daily as needed.  Marland Kitchen OZEMPIC, 1 MG/DOSE, 2 MG/1.5ML SOPN Inject 1 mg into the skin once a week.  . pregabalin (LYRICA) 200 MG capsule Take 200 mg by mouth 2 (two) times daily.  . sildenafil (VIAGRA) 100 MG tablet Take 100 mg by mouth daily as needed. Prior to sexual activity  . SYMBICORT 160-4.5 MCG/ACT inhaler Inhale 2 puffs into the lungs daily as needed.  . testosterone cypionate (DEPOTESTOSTERONE CYPIONATE) 200 MG/ML injection Inject 200 mg into the  muscle every 21 ( twenty-one) days.     Allergies:   Augmentin [amoxicillin-pot clavulanate], Ivp dye [iodinated diagnostic agents], Ketorolac, Toradol [ketorolac tromethamine], Atorvastatin, Guaifenesin, Penicillins, Dextromethorphan, and Pseudoephedrine   Social History   Socioeconomic History  . Marital status: Divorced    Spouse name: Not on file  . Number of children: Not on file  . Years of education: Not on file  . Highest education level: Not on file  Occupational History  . Not on file  Tobacco Use  . Smoking status: Never Smoker  . Smokeless tobacco: Never Used  Substance and Sexual Activity  . Alcohol use: No  . Drug use: No  . Sexual activity: Not on file  Other Topics Concern  . Not on file  Social History Narrative  . Not on file   Social Determinants of Health   Financial Resource Strain:   . Difficulty of Paying Living Expenses:   Food Insecurity:   . Worried About Charity fundraiser in the Last Year:   . Arboriculturist in the Last Year:   Transportation Needs:   . Film/video editor (Medical):   Marland Kitchen Lack of Transportation (Non-Medical):   Physical Activity:   . Days of Exercise per Week:   . Minutes of Exercise per Session:   Stress:   . Feeling of Stress :   Social Connections:   . Frequency of Communication with Friends and Family:   . Frequency of Social Gatherings with Friends and Family:   . Attends Religious Services:   . Active Member of Clubs or Organizations:   . Attends Archivist Meetings:   Marland Kitchen Marital Status:      Family History: The patient's family history includes Macular degeneration in his mother; Thyroid disease in his mother.  ROS:   Please see the history of present illness.    All other systems reviewed and are negative.  EKGs/Labs/Other Studies Reviewed:    The following studies were reviewed today: I reviewed [facility] records extensively including my notes hospital notes, echocardiogram and  stress test report.   Recent Labs: No results found for requested labs within last 8760 hours.  Recent Lipid Panel No results found for: CHOL, TRIG, HDL, CHOLHDL, VLDL, LDLCALC, LDLDIRECT  Physical Exam:    VS:  BP 132/80   Pulse 86   Ht 6\' 4"  (1.93 m)   Wt (!) 309 lb (140.2 kg)   SpO2 94%   BMI 37.61 kg/m     Wt Readings from Last 3  Encounters:  11/02/19 (!) 309 lb (140.2 kg)     GEN: Patient is in no acute distress HEENT: Normal NECK: No JVD; No carotid bruits LYMPHATICS: No lymphadenopathy CARDIAC: Hear sounds regular, 2/6 systolic murmur at the apex. RESPIRATORY:  Clear to auscultation without rales, wheezing or rhonchi  ABDOMEN: Soft, non-tender, non-distended MUSCULOSKELETAL:  No edema; No deformity  SKIN: Warm and dry NEUROLOGIC:  Alert and oriented x 3 PSYCHIATRIC:  Normal affect   Signed, Peter Lindau, MD  11/02/2019 2:29 PM    [place]

## 2019-11-03 ENCOUNTER — Ambulatory Visit: Payer: Self-pay

## 2019-11-03 DIAGNOSIS — I4891 Unspecified atrial fibrillation: Secondary | ICD-10-CM | POA: Diagnosis not present

## 2019-11-03 DIAGNOSIS — R079 Chest pain, unspecified: Secondary | ICD-10-CM | POA: Diagnosis not present

## 2019-11-03 DIAGNOSIS — I10 Essential (primary) hypertension: Secondary | ICD-10-CM | POA: Diagnosis not present

## 2019-11-03 DIAGNOSIS — Z7902 Long term (current) use of antithrombotics/antiplatelets: Secondary | ICD-10-CM | POA: Diagnosis not present

## 2019-11-03 DIAGNOSIS — E119 Type 2 diabetes mellitus without complications: Secondary | ICD-10-CM | POA: Diagnosis not present

## 2019-11-03 DIAGNOSIS — M940 Chondrocostal junction syndrome [Tietze]: Secondary | ICD-10-CM | POA: Diagnosis not present

## 2019-11-03 NOTE — Telephone Encounter (Signed)
Patient called stating that he developed chest pain today. He knows of know injury He did lift about 20 lbs of can goods into his car. He feels this did not injure him. He states that the pain is sharp comes and goes under his left breast area.  It dose not travel anywhere. He rates that pain at 10. He has Afib which he has seen a cardiologist yesterday. He will need cardioversion scheduled for next week. Per protocol patient will go to ER for evaluation of this chest pain.  Care advice read to patient. He verbalized understanding. 911 was offered patient states that he will call if needed.  Reason for Disposition . [1] Chest pain (or "angina") comes and goes AND [2] is happening more often (increasing in frequency) or getting worse (increasing in severity)  Answer Assessment - Initial Assessment Questions 1. LOCATION: "Where does it hurt?"       Left chest under breast 2. RADIATION: "Does the pain go anywhere else?" (e.g., into neck, jaw, arms, back)     no 3. ONSET: "When did the chest pain begin?" (Minutes, hours or days)      This 2-3 minutes ago 4. PATTERN "Does the pain come and go, or has it been constant since it started?"  "Does it get worse with exertion?"      Sharp pain comes and goes 5. DURATION: "How long does it last" (e.g., seconds, minutes, hours)     seconds 6. SEVERITY: "How bad is the pain?"  (e.g., Scale 1-10; mild, moderate, or severe)    - MILD (1-3): doesn't interfere with normal activities     - MODERATE (4-7): interferes with normal activities or awakens from sleep    - SEVERE (8-10): excruciating pain, unable to do any normal activities      10 7. CARDIAC RISK FACTORS: "Do you have any history of heart problems or risk factors for heart disease?" (e.g., angina, prior heart attack; diabetes, high blood pressure, high cholesterol, smoker, or strong family history of heart disease)     Afib 8. PULMONARY RISK FACTORS: "Do you have any history of lung disease?"  (e.g.,  blood clots in lung, asthma, emphysema, birth control pills)     none 9. CAUSE: "What do you think is causing the chest pain?"    unsure 10. OTHER SYMPTOMS: "Do you have any other symptoms?" (e.g., dizziness, nausea, vomiting, sweating, fever, difficulty breathing, cough)       none 11. PREGNANCY: "Is there any chance you are pregnant?" "When was your last menstrual period?"      N/A  Protocols used: CHEST PAIN-A-AH

## 2019-11-04 ENCOUNTER — Telehealth: Payer: Self-pay | Admitting: Cardiology

## 2019-11-04 NOTE — Telephone Encounter (Signed)
Patient called to state he was in the ER yesterday for chest pain and was advised to call to let us know.  It was determined that it was not heart related, but inflammation of the chest cartilage.  They also told him that his EKG showed heart flutter.

## 2019-11-04 NOTE — Telephone Encounter (Signed)
Dr. Geraldo Pitter reviewed [facility] Records and no recommendations for changes were made. Pt has been made aware and had no questions.

## 2019-11-09 ENCOUNTER — Telehealth: Payer: Self-pay | Admitting: Cardiology

## 2019-11-09 DIAGNOSIS — E291 Testicular hypofunction: Secondary | ICD-10-CM | POA: Diagnosis not present

## 2019-11-09 NOTE — Telephone Encounter (Signed)
Pt c/o medication issue:  1. Name of Medication: ELIQUIS 5 MG TABS tablet  2. How are you currently taking this medication (dosage and times per day)? As directed  3. Are you having a reaction (difficulty breathing--STAT)? No  4. What is your medication issue? Patient is requesting to have the medication adjusted. He states for the past few weeks he has been experiencing some bruising on his ankles. In addition, he states on 11/08/19 he noticed spotting in his left eye. Patient assumes adjusting blood thinner will help fix this issue. Please advise.

## 2019-11-09 NOTE — Telephone Encounter (Signed)
These medications have no dose adjustments.  The bruising is not an issue.  He needs to see his eye doctor for his ophthalmic issue ASAP.

## 2019-11-09 NOTE — Telephone Encounter (Signed)
LMTCB

## 2019-11-09 NOTE — Telephone Encounter (Signed)
Recommendations given to pt as per Dr. Geraldo Pitter. Pt verbalized understanding and had no additional questions.

## 2019-11-10 ENCOUNTER — Telehealth: Payer: Self-pay | Admitting: Cardiology

## 2019-11-10 DIAGNOSIS — I4819 Other persistent atrial fibrillation: Secondary | ICD-10-CM

## 2019-11-10 DIAGNOSIS — H43811 Vitreous degeneration, right eye: Secondary | ICD-10-CM | POA: Diagnosis not present

## 2019-11-10 DIAGNOSIS — H43393 Other vitreous opacities, bilateral: Secondary | ICD-10-CM | POA: Diagnosis not present

## 2019-11-10 NOTE — Telephone Encounter (Signed)
New message   Patient would like to know if he can take Linzess 125 mg with his other medications. Please advise.

## 2019-11-10 NOTE — Telephone Encounter (Signed)
Breast to check with pharmacist.

## 2019-11-11 ENCOUNTER — Telehealth: Payer: Self-pay

## 2019-11-15 DIAGNOSIS — Z7189 Other specified counseling: Secondary | ICD-10-CM | POA: Diagnosis not present

## 2019-11-15 DIAGNOSIS — G4733 Obstructive sleep apnea (adult) (pediatric): Secondary | ICD-10-CM | POA: Diagnosis not present

## 2019-11-15 DIAGNOSIS — Z6836 Body mass index (BMI) 36.0-36.9, adult: Secondary | ICD-10-CM | POA: Diagnosis not present

## 2019-11-15 DIAGNOSIS — I4891 Unspecified atrial fibrillation: Secondary | ICD-10-CM | POA: Diagnosis not present

## 2019-11-16 ENCOUNTER — Encounter: Payer: Self-pay | Admitting: Cardiology

## 2019-11-16 ENCOUNTER — Telehealth: Payer: Self-pay | Admitting: Cardiology

## 2019-11-16 NOTE — Telephone Encounter (Signed)
Pt aware to check with the pharmacist. '  Pt was given the date for his cardioversion at [place]. Pt will come to the office for labs on Monday, have an appointment on 11/23/19 for EKG and cardioversion on 11/24/19. Pt verbalized understanding and had no additional questions. Pt will get his COVID test himself and will bring the results to the office on Tuesday. He verbalized understanding that is this is not done that the cardioversion will be cancelled.

## 2019-11-16 NOTE — Telephone Encounter (Signed)
Pt aware that it will be ok to continue with the cardioversion for a late medication of 6 hours per Dr. Geraldo Pitter. Pt had no additional questions.

## 2019-11-16 NOTE — Telephone Encounter (Signed)
Error

## 2019-11-16 NOTE — Telephone Encounter (Signed)
New Message  the patient wanted me to ask that if he is 6 hrs late taking his medication today that dr. Geraldo Pitter told him to take, will it affect him when he has his cardioversion done  Please call to discuss

## 2019-11-17 NOTE — Telephone Encounter (Signed)
Recommendations as per Dr. Geraldo Pitter.

## 2019-11-19 DIAGNOSIS — Z20822 Contact with and (suspected) exposure to covid-19: Secondary | ICD-10-CM | POA: Diagnosis not present

## 2019-11-22 DIAGNOSIS — I4819 Other persistent atrial fibrillation: Secondary | ICD-10-CM | POA: Diagnosis not present

## 2019-11-22 DIAGNOSIS — E1140 Type 2 diabetes mellitus with diabetic neuropathy, unspecified: Secondary | ICD-10-CM | POA: Diagnosis not present

## 2019-11-22 DIAGNOSIS — E1159 Type 2 diabetes mellitus with other circulatory complications: Secondary | ICD-10-CM | POA: Diagnosis not present

## 2019-11-22 DIAGNOSIS — I152 Hypertension secondary to endocrine disorders: Secondary | ICD-10-CM | POA: Diagnosis not present

## 2019-11-22 DIAGNOSIS — E291 Testicular hypofunction: Secondary | ICD-10-CM | POA: Diagnosis not present

## 2019-11-23 ENCOUNTER — Other Ambulatory Visit: Payer: Self-pay

## 2019-11-23 ENCOUNTER — Ambulatory Visit: Payer: Medicare HMO | Admitting: Cardiology

## 2019-11-23 ENCOUNTER — Encounter: Payer: Self-pay | Admitting: Cardiology

## 2019-11-23 ENCOUNTER — Other Ambulatory Visit: Payer: Self-pay | Admitting: Cardiology

## 2019-11-23 VITALS — BP 130/82 | HR 88 | Ht 76.0 in | Wt 311.8 lb

## 2019-11-23 DIAGNOSIS — I4819 Other persistent atrial fibrillation: Secondary | ICD-10-CM

## 2019-11-23 DIAGNOSIS — E088 Diabetes mellitus due to underlying condition with unspecified complications: Secondary | ICD-10-CM

## 2019-11-23 DIAGNOSIS — I10 Essential (primary) hypertension: Secondary | ICD-10-CM

## 2019-11-23 LAB — BASIC METABOLIC PANEL
BUN/Creatinine Ratio: 19 (ref 10–24)
BUN: 20 mg/dL (ref 8–27)
CO2: 26 mmol/L (ref 20–29)
Calcium: 9.9 mg/dL (ref 8.6–10.2)
Chloride: 99 mmol/L (ref 96–106)
Creatinine, Ser: 1.05 mg/dL (ref 0.76–1.27)
GFR calc Af Amer: 86 mL/min/{1.73_m2} (ref >59)
GFR calc non Af Amer: 74 mL/min/{1.73_m2} (ref >59)
Glucose: 101 mg/dL — ABNORMAL HIGH (ref 65–99)
Potassium: 4.8 mmol/L (ref 3.5–5.2)
Sodium: 139 mmol/L (ref 134–144)

## 2019-11-23 MED ORDER — ELIQUIS 5 MG PO TABS: 5.0000 mg | ORAL_TABLET | Freq: Two times a day (BID) | ORAL | 3 refills | Status: DC

## 2019-11-23 MED ORDER — DIGOXIN 250 MCG PO TABS: 250.0000 ug | ORAL_TABLET | Freq: Every day | ORAL | 3 refills | Status: DC

## 2019-11-23 NOTE — Patient Instructions (Signed)
Medication Instructions:  No medication changes. *If you need a refill on your cardiac medications before your next appointment, please call your pharmacy*   Lab Work: None ordered If you have labs (blood work) drawn today and your tests are completely normal, you will receive your results only by: . MyChart Message (if you have MyChart) OR . A paper copy in the mail If you have any lab test that is abnormal or we need to change your treatment, we will call you to review the results.   Testing/Procedures: None ordered   Follow-Up: At [facility], you and your health needs are our priority.  As part of our continuing mission to provide you with exceptional heart care, we have created designated Provider Care Teams.  These Care Teams include your primary Cardiologist (physician) and Advanced Practice Providers (APPs -  Physician Assistants and Nurse Practitioners) who all work together to provide you with the care you need, when you need it.  We recommend signing up for the patient portal called "MyChart".  Sign up information is provided on this After Visit Summary.  MyChart is used to connect with patients for Virtual Visits (Telemedicine).  Patients are able to view lab/test results, encounter notes, upcoming appointments, etc.  Non-urgent messages can be sent to your provider as well.   To learn more about what you can do with MyChart, go to [email or web address].    Your next appointment:   1 month(s)  The format for your next appointment:   In Person  Provider:   Rajan Revankar, MD   Other Instructions NA  

## 2019-11-23 NOTE — Progress Notes (Signed)
Cardiology Office Note:    Date:  11/23/2019   ID:  Peter Buck, DOB 1956/01/29, MRN [id number]  PCP:  [name], MD  Cardiologist:  Jenean Lindau, MD   Referring MD: Dortha Kern, [place]    ASSESSMENT:    1. Persistent atrial fibrillation ([place])   2. Essential hypertension   3. Diabetes mellitus due to underlying condition with unspecified complications ([place])    PLAN:    In order of problems listed above:  1. Persistent atrial fibrillation:I discussed with the patient atrial fibrillation, disease process. Management and therapy including rate and rhythm control, anticoagulation benefits and potential risks were discussed extensively with the patient. Patient had multiple questions which were answered to patient's satisfaction.  I discussed my findings with the patient at length.  Cardioversion, procedure and benefits and potential is explained and he vocalized understanding and questions were answered to satisfaction.  In the past several weeks that he has initiated Eliquis he mentions to me that has not missed a single dose.  He will take his medication Eliquis with a sip of water in the morning also. 2. Essential hypertension: Blood pressure is stable 3. Diabetes mellitus: Managed by primary care physician and diet was emphasized. 4. Patient will be seen in follow-up appointment in 4 weeks or earlier if the patient has any concerns    Medication Adjustments/Labs and Tests Ordered: Current medicines are reviewed at length with the patient today.  Concerns regarding medicines are outlined above.  No orders of the defined types were placed in this encounter.  No orders of the defined types were placed in this encounter.    Chief Complaint  Patient presents with  . Follow-up     History of Present Illness:    Peter Buck is a 64 y.o. male.  Patient was recently at the hospital and was diagnosed with atrial fibrillation.  He has history of essential hypertension  dyslipidemia and diabetes mellitus.  He denies any problems at this time and takes care of activities of daily living.  No chest pain orthopnea or PND.  When he exerts more than usual he is having shortness of breath.  At the time of my evaluation, the patient is alert awake oriented and in no distress.  Past Medical History:  Diagnosis Date  . Aftercare following surgery 04/23/2018  . Anemia   . Arthritis   . Arthritis, midfoot 12/05/2016  . Asthma   . Bipolar affective disorder, depressed ([place]) 02/13/2016  . Bipolar disorder ([place])   . Body mass index (bmi) 38.0-38.9, adult 09/16/2017  . Borderline personality disorder ([place]) 04/29/2016  . Cervical radiculopathy 02/02/2019   Last Assessment & Plan:  Formatting of this note might be different from the original. MRI of the cervical spine shows cervical spondylosis with facet arthrosis most notable for mild to moderate left neural foraminal stenosis at C3-4. Per patient, nerve conduction studies showed right C7-8 radiculopathy.   Continue with current regimen as outlined in lumbar degenerative disc plan.  . Chronic pain of left knee 10/19/2019  . Chronic pain syndrome 02/02/2019  . Comprehensive diabetic foot examination, type 2 DM, encounter for ([place]) 12/25/2015  . DDD (degenerative disc disease), lumbar 11/25/2017   Last Assessment & Plan:  Formatting of this note might be different from the original. 64 year old male with chronic, predominantly left-sided low back pain with radiation into the left hip and groin.  Today we reviewed his recently updated lumbar MRI which shows impingement of the left L2 nerve root  due to broad-based disc bulging lateralizing to the left and projecting into the far left lateral   . Diabetes mellitus without complication ([place])   . Facet arthropathy, cervical 02/02/2019  . Foraminal stenosis of cervical region 02/02/2019  . GERD (gastroesophageal reflux disease)   . Hammertoe 04/26/2014  . Hypertension   . Insomnia disorder  04/29/2016  . Low back pain 11/25/2017   Last Assessment & Plan:  Peter Buck is a 64 y.o. male with Low back pain, and acute on chronic low back pain with left hip, knee, and foot pain.  Today I will treat with the patient's current medication of tramadol and diclofenac and add relafen (temporarily d/c diclofenac) for 10 days and increase lyrica. He will continue with his current appt with his neurosurgery.  Peter Buck will return to   . Muscle spasm of back 02/17/2018   Last Assessment & Plan:  Formatting of this note might be different from the original. Continue with tizanidine 4 mg as needed for muscle spasming up to 3 times daily.  . Neuroforaminal stenosis of lumbar spine 12/17/2018  . Onychomycosis due to dermatophyte 12/25/2015  . Pain associated with accessory navicular bone of foot, left 05/25/2018  . Pain medication agreement 10/16/2018   Last Assessment & Plan:  Formatting of this note might be different from the original. Review of prior UDS results are within normal limits.  [place] controlled substance registry reviewed and there were no inconsistencies noted.  . Plantar fasciitis 01/02/2017  . Primary osteoarthritis of both first carpometacarpal joints 08/25/2019  . Second degree burn of finger of right hand 08/25/2019  . Sleep apnea   . Therapeutic opioid induced constipation 06/22/2019   Last Assessment & Plan:  Formatting of this note might be different from the original. Continue amitiza 71mcg b.i.d.  . Verruca 08/25/2019    Past Surgical History:  Procedure Laterality Date  . APPENDECTOMY    . CHOLECYSTECTOMY    . INNER EAR SURGERY    . KNEE ARTHROSCOPY      Current Medications: Current Meds  Medication Sig  . acetaminophen (TYLENOL) 650 MG CR tablet Take 650 mg by mouth every 8 (eight) hours as needed. Or take 650 mg every 5 hours for severe pain  . albuterol (VENTOLIN HFA) 108 (90 Base) MCG/ACT inhaler Inhale 2 puffs into the lungs every 6 (six) hours as needed.  .  AMITIZA 24 MCG capsule Take 24 mcg by mouth 2 (two) times daily.  . busPIRone (BUSPAR) 15 MG tablet Take 15 mg by mouth 2 (two) times daily.  Marland Kitchen BYDUREON BCISE 2 MG/0.85ML AUIJ   . Calcium Citrate-Vitamin D 200-250 MG-UNIT TABS Take 1 tablet by mouth at bedtime.  . celecoxib (CELEBREX) 200 MG capsule Take 1 capsule by mouth 2 (two) times daily.  . chlorhexidine (PERIDEX) 0.12 % solution PLEASE SEE ATTACHED FOR DETAILED DIRECTIONS  . cloNIDine (CATAPRES) 0.1 MG tablet   . diclofenac Sodium (VOLTAREN) 1 % GEL Apply 1 application topically in the morning, at noon, in the evening, and at bedtime.  . digoxin (LANOXIN) 0.25 MG tablet Take 1 tablet (250 mcg total) by mouth daily.  Marland Kitchen ELIQUIS 5 MG TABS tablet Take 1 tablet (5 mg total) by mouth 2 (two) times daily.  Marland Kitchen HYDROcodone-acetaminophen (NORCO/VICODIN) 5-325 MG tablet Take 1 tablet by mouth 2 (two) times daily as needed.  . lovastatin (MEVACOR) 10 MG tablet Take 10 mg by mouth in the morning.  . meclizine (ANTIVERT) 12.5 MG tablet Take  12.5 mg by mouth 3 (three) times daily as needed.  . metFORMIN (GLUCOPHAGE) 1000 MG tablet Take 1,000 mg by mouth 2 (two) times daily.  . mometasone (NASONEX) 50 MCG/ACT nasal spray Place 1 spray into the nose 2 (two) times daily.  Marland Kitchen MOVANTIK 25 MG TABS tablet Take 1 tablet by mouth in the morning. 1 hour prior to food  . nebivolol (BYSTOLIC) 10 MG tablet Take 10 mg by mouth at bedtime.  . ondansetron (ZOFRAN-ODT) 8 MG disintegrating tablet Take 8 mg by mouth daily as needed.  Marland Kitchen OZEMPIC, 1 MG/DOSE, 2 MG/1.5ML SOPN Inject 1 mg into the skin once a week.  . pregabalin (LYRICA) 200 MG capsule Take 200 mg by mouth 2 (two) times daily.  . sildenafil (VIAGRA) 100 MG tablet Take 100 mg by mouth daily as needed. Prior to sexual activity  . SYMBICORT 160-4.5 MCG/ACT inhaler Inhale 2 puffs into the lungs daily as needed.  . testosterone cypionate (DEPOTESTOSTERONE CYPIONATE) 200 MG/ML injection Inject 200 mg into the muscle  every 21 ( twenty-one) days.  . valsartan (DIOVAN) 160 MG tablet take 1/2 qhs     Allergies:   Augmentin [amoxicillin-pot clavulanate], Ivp dye [iodinated diagnostic agents], Ketorolac, Toradol [ketorolac tromethamine], Atorvastatin, Guaifenesin, Penicillins, Dextromethorphan, and Pseudoephedrine   Social History   Socioeconomic History  . Marital status: Divorced    Spouse name: Not on file  . Number of children: Not on file  . Years of education: Not on file  . Highest education level: Not on file  Occupational History  . Not on file  Tobacco Use  . Smoking status: Never Smoker  . Smokeless tobacco: Never Used  Substance and Sexual Activity  . Alcohol use: No  . Drug use: No  . Sexual activity: Not on file  Other Topics Concern  . Not on file  Social History Narrative  . Not on file   Social Determinants of Health   Financial Resource Strain:   . Difficulty of Paying Living Expenses:   Food Insecurity:   . Worried About Charity fundraiser in the Last Year:   . Arboriculturist in the Last Year:   Transportation Needs:   . Film/video editor (Medical):   Marland Kitchen Lack of Transportation (Non-Medical):   Physical Activity:   . Days of Exercise per Week:   . Minutes of Exercise per Session:   Stress:   . Feeling of Stress :   Social Connections:   . Frequency of Communication with Friends and Family:   . Frequency of Social Gatherings with Friends and Family:   . Attends Religious Services:   . Active Member of Clubs or Organizations:   . Attends Archivist Meetings:   Marland Kitchen Marital Status:      Family History: The patient's family history includes Macular degeneration in his mother; Thyroid disease in his mother.  ROS:   Please see the history of present illness.    All other systems reviewed and are negative.  EKGs/Labs/Other Studies Reviewed:    The following studies were reviewed today: EKG reveals atrial fibrillation with well-controlled ventricular  rate   Recent Labs: 11/22/2019: BUN 20; Creatinine, Ser 1.05; Potassium 4.8; Sodium 139  Recent Lipid Panel No results found for: CHOL, TRIG, HDL, CHOLHDL, VLDL, LDLCALC, LDLDIRECT  Physical Exam:    VS:  BP 130/82   Pulse 88   Ht 6\' 4"  (1.93 m)   Wt (!) 311 lb 12.8 oz (141.4 kg)  SpO2 95%   BMI 37.95 kg/m     Wt Readings from Last 3 Encounters:  11/23/19 (!) 311 lb 12.8 oz (141.4 kg)  11/02/19 (!) 309 lb (140.2 kg)     GEN: Patient is in no acute distress HEENT: Normal NECK: No JVD; No carotid bruits LYMPHATICS: No lymphadenopathy CARDIAC: Hear sounds regular, 2/6 systolic murmur at the apex. RESPIRATORY:  Clear to auscultation without rales, wheezing or rhonchi  ABDOMEN: Soft, non-tender, non-distended MUSCULOSKELETAL:  No edema; No deformity  SKIN: Warm and dry NEUROLOGIC:  Alert and oriented x 3 PSYCHIATRIC:  Normal affect   Signed, Jenean Lindau, MD  11/23/2019 11:27 AM    [place]

## 2019-11-23 NOTE — Telephone Encounter (Signed)
New Message     *STAT* If patient is at the pharmacy, call can be transferred to refill team.   1. Which medications need to be refilled? (please list name of each medication and dose if known)  ELIQUIS 5 MG TABS tablet  digoxin (LANOXIN) 0.25 MG tablet  2. Which pharmacy/location (including street and city if local pharmacy) is medication to be sent to? [id number] - [place], [place] - [address]  3. Do they need a 30 day or 90 day supply? [place]

## 2019-11-24 DIAGNOSIS — Z7901 Long term (current) use of anticoagulants: Secondary | ICD-10-CM | POA: Diagnosis not present

## 2019-11-24 DIAGNOSIS — I10 Essential (primary) hypertension: Secondary | ICD-10-CM | POA: Diagnosis not present

## 2019-11-24 DIAGNOSIS — G629 Polyneuropathy, unspecified: Secondary | ICD-10-CM | POA: Diagnosis not present

## 2019-11-24 DIAGNOSIS — Z6837 Body mass index (BMI) 37.0-37.9, adult: Secondary | ICD-10-CM | POA: Diagnosis not present

## 2019-11-24 DIAGNOSIS — I4891 Unspecified atrial fibrillation: Secondary | ICD-10-CM | POA: Diagnosis not present

## 2019-11-24 DIAGNOSIS — I4819 Other persistent atrial fibrillation: Secondary | ICD-10-CM | POA: Diagnosis not present

## 2019-11-24 DIAGNOSIS — M1990 Unspecified osteoarthritis, unspecified site: Secondary | ICD-10-CM | POA: Diagnosis not present

## 2019-11-24 DIAGNOSIS — E669 Obesity, unspecified: Secondary | ICD-10-CM | POA: Diagnosis not present

## 2019-11-24 DIAGNOSIS — K219 Gastro-esophageal reflux disease without esophagitis: Secondary | ICD-10-CM | POA: Diagnosis not present

## 2019-11-24 DIAGNOSIS — J302 Other seasonal allergic rhinitis: Secondary | ICD-10-CM | POA: Diagnosis not present

## 2019-11-24 DIAGNOSIS — E119 Type 2 diabetes mellitus without complications: Secondary | ICD-10-CM | POA: Diagnosis not present

## 2019-11-26 ENCOUNTER — Telehealth: Payer: Self-pay

## 2019-11-26 NOTE — Telephone Encounter (Signed)
Spoke with the Peter Buck who states that he has a swollen lump under his ear and that his ankle was swollen but has since decreased to normal. Peter Buck wanted to make sure this was not related to his cardioversion that he had on Monday. I told Peter Buck that these would not be complications of the cardioversion. Peter Buck states that he is going to Urgent Care because the lump at the ear is painful. Peter Buck had no additional questions and verbalized understanding

## 2019-11-29 ENCOUNTER — Ambulatory Visit: Payer: Medicare HMO | Admitting: Cardiology

## 2019-11-30 DIAGNOSIS — R69 Illness, unspecified: Secondary | ICD-10-CM | POA: Diagnosis not present

## 2019-12-01 DIAGNOSIS — E291 Testicular hypofunction: Secondary | ICD-10-CM | POA: Diagnosis not present

## 2019-12-02 DIAGNOSIS — E119 Type 2 diabetes mellitus without complications: Secondary | ICD-10-CM | POA: Diagnosis not present

## 2019-12-02 DIAGNOSIS — M19072 Primary osteoarthritis, left ankle and foot: Secondary | ICD-10-CM | POA: Diagnosis not present

## 2019-12-02 DIAGNOSIS — B351 Tinea unguium: Secondary | ICD-10-CM | POA: Diagnosis not present

## 2019-12-08 DIAGNOSIS — Z6837 Body mass index (BMI) 37.0-37.9, adult: Secondary | ICD-10-CM | POA: Diagnosis not present

## 2019-12-08 DIAGNOSIS — K5909 Other constipation: Secondary | ICD-10-CM | POA: Diagnosis not present

## 2019-12-15 DIAGNOSIS — I482 Chronic atrial fibrillation, unspecified: Secondary | ICD-10-CM | POA: Diagnosis not present

## 2019-12-15 DIAGNOSIS — Z6837 Body mass index (BMI) 37.0-37.9, adult: Secondary | ICD-10-CM | POA: Diagnosis not present

## 2019-12-15 DIAGNOSIS — I152 Hypertension secondary to endocrine disorders: Secondary | ICD-10-CM | POA: Diagnosis not present

## 2019-12-21 DIAGNOSIS — M5412 Radiculopathy, cervical region: Secondary | ICD-10-CM | POA: Diagnosis not present

## 2019-12-21 DIAGNOSIS — T402X5D Adverse effect of other opioids, subsequent encounter: Secondary | ICD-10-CM | POA: Diagnosis not present

## 2019-12-21 DIAGNOSIS — K5903 Drug induced constipation: Secondary | ICD-10-CM | POA: Diagnosis not present

## 2019-12-21 DIAGNOSIS — Z760 Encounter for issue of repeat prescription: Secondary | ICD-10-CM | POA: Diagnosis not present

## 2019-12-21 DIAGNOSIS — G8929 Other chronic pain: Secondary | ICD-10-CM | POA: Diagnosis not present

## 2019-12-21 DIAGNOSIS — Z79899 Other long term (current) drug therapy: Secondary | ICD-10-CM | POA: Diagnosis not present

## 2019-12-21 DIAGNOSIS — Z79891 Long term (current) use of opiate analgesic: Secondary | ICD-10-CM | POA: Diagnosis not present

## 2019-12-21 DIAGNOSIS — M5136 Other intervertebral disc degeneration, lumbar region: Secondary | ICD-10-CM | POA: Diagnosis not present

## 2019-12-22 DIAGNOSIS — E291 Testicular hypofunction: Secondary | ICD-10-CM | POA: Diagnosis not present

## 2019-12-22 DIAGNOSIS — H43393 Other vitreous opacities, bilateral: Secondary | ICD-10-CM | POA: Diagnosis not present

## 2019-12-23 ENCOUNTER — Ambulatory Visit (INDEPENDENT_AMBULATORY_CARE_PROVIDER_SITE_OTHER): Payer: Medicare HMO | Admitting: Cardiology

## 2019-12-23 ENCOUNTER — Encounter: Payer: Self-pay | Admitting: Cardiology

## 2019-12-23 ENCOUNTER — Other Ambulatory Visit: Payer: Self-pay

## 2019-12-23 VITALS — BP 148/76 | HR 84 | Ht 76.0 in | Wt 318.2 lb

## 2019-12-23 DIAGNOSIS — I480 Paroxysmal atrial fibrillation: Secondary | ICD-10-CM | POA: Diagnosis not present

## 2019-12-23 DIAGNOSIS — I10 Essential (primary) hypertension: Secondary | ICD-10-CM

## 2019-12-23 DIAGNOSIS — E088 Diabetes mellitus due to underlying condition with unspecified complications: Secondary | ICD-10-CM | POA: Diagnosis not present

## 2019-12-23 HISTORY — DX: Paroxysmal atrial fibrillation: I48.0

## 2019-12-23 NOTE — Progress Notes (Signed)
Cardiology Office Note:    Date:  12/23/2019   ID:  Peter Buck, DOB 1956/04/28, MRN [id number]  PCP:  [name], MD  Cardiologist:  Jenean Lindau, MD   Referring MD: [name], MD    ASSESSMENT:    1. Essential hypertension   2. Paroxysmal atrial fibrillation (HCC)   3. Diabetes mellitus due to underlying condition with unspecified complications ([place])    PLAN:    In order of problems listed above:  1. Primary prevention stressed with the patient.  Importance of compliance with diet medication stressed and he vocalized understanding. 2. Paroxysmal atrial fibrillation: Post cardioversion:I discussed with the patient atrial fibrillation, disease process. Management and therapy including rate and rhythm control, anticoagulation benefits and potential risks were discussed extensively with the patient. Patient had multiple questions which were answered to patient's satisfaction. 3. Essential hypertension: Blood pressure stable 4. Mixed dyslipidemia and diabetes mellitus, morbid obesity: Diet was emphasized and weight reduction was stressed risks of obesity explained and he vocalized understanding and promises to comply 5. Patient will be seen in follow-up appointment in 6 months or earlier if the patient has any concerns    Medication Adjustments/Labs and Tests Ordered: Current medicines are reviewed at length with the patient today.  Concerns regarding medicines are outlined above.  No orders of the defined types were placed in this encounter.  No orders of the defined types were placed in this encounter.    No chief complaint on file.    History of Present Illness:    Peter Buck is a 64 y.o. male.  Patient has past medical history of proximal fibrillation post cardioversion he is in sinus rhythm.  He has history of essential hypertension dyslipidemia diabetes mellitus and obesity.  He leads a sedentary lifestyle.  Denies any chest pain orthopnea  palpitations or any such symptoms.  At the time of my evaluation, the patient is alert awake oriented and in no distress.  Past Medical History:  Diagnosis Date  . Aftercare following surgery 04/23/2018  . Anemia   . Arthritis   . Arthritis, midfoot 12/05/2016  . Asthma   . Bipolar affective disorder, depressed ([place]) 02/13/2016  . Bipolar disorder ([place])   . Body mass index (bmi) 38.0-38.9, adult 09/16/2017  . Borderline personality disorder ([place]) 04/29/2016  . Cervical radiculopathy 02/02/2019   Last Assessment & Plan:  Formatting of this note might be different from the original. MRI of the cervical spine shows cervical spondylosis with facet arthrosis most notable for mild to moderate left neural foraminal stenosis at C3-4. Per patient, nerve conduction studies showed right C7-8 radiculopathy.   Continue with current regimen as outlined in lumbar degenerative disc plan.  . Chronic pain of left knee 10/19/2019  . Chronic pain syndrome 02/02/2019  . Comprehensive diabetic foot examination, type 2 DM, encounter for ([place]) 12/25/2015  . DDD (degenerative disc disease), lumbar 11/25/2017   Last Assessment & Plan:  Formatting of this note might be different from the original. 64 year old male with chronic, predominantly left-sided low back pain with radiation into the left hip and groin.  Today we reviewed his recently updated lumbar MRI which shows impingement of the left L2 nerve root due to broad-based disc bulging lateralizing to the left and projecting into the far left lateral   . Diabetes mellitus without complication ([place])   . Facet arthropathy, cervical 02/02/2019  . Foraminal stenosis of cervical region 02/02/2019  . GERD (gastroesophageal reflux disease)   . Hammertoe 04/26/2014  .  Hypertension   . 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 Low back pain 11/25/2017   Last Assessment & Plan:  Peter Buck is a 64 y.o. male with Low back pain, and acute on chronic low back pain with left hip, knee, and  foot pain.  Today I will treat with the patient's current medication of tramadol and diclofenac and add relafen (temporarily d/c diclofenac) for 10 days and increase lyrica. He will continue with his current appt with his neurosurgery.  [name] will return to   . Muscle spasm of back 02/17/2018   Last Assessment & Plan:  Formatting of this note might be different from the original. Continue with tizanidine 4 mg as needed for muscle spasming up to 3 times daily.  . Neuroforaminal stenosis of lumbar spine 12/17/2018  . Onychomycosis due to dermatophyte 12/25/2015  . Pain associated with accessory navicular bone of foot, left 05/25/2018  . Pain medication agreement 10/16/2018   Last Assessment & Plan:  Formatting of this note might be different from the original. Review of prior UDS results are within normal limits.  [place] controlled substance registry reviewed and there were no inconsistencies noted.  . Plantar fasciitis 01/02/2017  . Primary osteoarthritis of both first carpometacarpal joints 08/25/2019  . Second degree burn of finger of right hand 08/25/2019  . Sleep apnea   . Therapeutic opioid induced constipation 06/22/2019   Last Assessment & Plan:  Formatting of this note might be different from the original. Continue amitiza 11mcg b.i.d.  . Verruca 08/25/2019    Past Surgical History:  Procedure Laterality Date  . APPENDECTOMY    . CHOLECYSTECTOMY    . INNER EAR SURGERY    . KNEE ARTHROSCOPY      Current Medications: Current Meds  Medication Sig  . acetaminophen (TYLENOL) 650 MG CR tablet Take 650 mg by mouth every 8 (eight) hours as needed. Or take 650 mg every 5 hours for severe pain  . albuterol (VENTOLIN HFA) 108 (90 Base) MCG/ACT inhaler Inhale 2 puffs into the lungs every 6 (six) hours as needed.  . AMITIZA 24 MCG capsule Take 24 mcg by mouth 2 (two) times daily.  . busPIRone (BUSPAR) 15 MG tablet Take 15 mg by mouth 2 (two) times daily.  . Calcium Citrate-Vitamin D 200-250  MG-UNIT TABS Take 1 tablet by mouth at bedtime.  . celecoxib (CELEBREX) 200 MG capsule Take 1 capsule by mouth 2 (two) times daily.  Marland Kitchen ELIQUIS 5 MG TABS tablet Take 1 tablet (5 mg total) by mouth 2 (two) times daily.  Marland Kitchen HYDROcodone-acetaminophen (NORCO/VICODIN) 5-325 MG tablet Take 1 tablet by mouth 2 (two) times daily as needed.  Marland Kitchen LINZESS 72 MCG capsule Take 72 mcg by mouth as needed.  . lovastatin (MEVACOR) 10 MG tablet Take 10 mg by mouth in the morning.  . metFORMIN (GLUCOPHAGE) 1000 MG tablet Take 1,000 mg by mouth 2 (two) times daily.  . mometasone (NASONEX) 50 MCG/ACT nasal spray Place 1 spray into the nose 2 (two) times daily.  Marland Kitchen MOVANTIK 25 MG TABS tablet Take 1 tablet by mouth in the morning. 1 hour prior to food  . nebivolol (BYSTOLIC) 10 MG tablet Take 10 mg by mouth 2 (two) times daily.  . ondansetron (ZOFRAN-ODT) 8 MG disintegrating tablet Take 8 mg by mouth daily as needed.  Marland Kitchen OZEMPIC, 1 MG/DOSE, 2 MG/1.5ML SOPN Inject 1 mg into the skin once a week.  . pregabalin (LYRICA) 200 MG capsule Take 200 mg by mouth  2 (two) times daily.  . sildenafil (VIAGRA) 100 MG tablet Take 100 mg by mouth daily as needed. Prior to sexual activity  . SYMBICORT 160-4.5 MCG/ACT inhaler Inhale 2 puffs into the lungs daily as needed.  . testosterone cypionate (DEPOTESTOSTERONE CYPIONATE) 200 MG/ML injection Inject 200 mg into the muscle every 21 ( twenty-one) days.     Allergies:   Augmentin [amoxicillin-pot clavulanate], Ivp dye [iodinated diagnostic agents], Ketorolac, Toradol [ketorolac tromethamine], Atorvastatin, Guaifenesin, Penicillins, Dextromethorphan, and Pseudoephedrine   Social History   Socioeconomic History  . Marital status: Divorced    Spouse name: Not on file  . Number of children: Not on file  . Years of education: Not on file  . Highest education level: Not on file  Occupational History  . Not on file  Tobacco Use  . Smoking status: Never Smoker  . Smokeless tobacco: Never  Used  Vaping Use  . Vaping Use: Never used  Substance and Sexual Activity  . Alcohol use: No  . Drug use: No  . Sexual activity: Not on file  Other Topics Concern  . Not on file  Social History Narrative  . Not on file   Social Determinants of Health   Financial Resource Strain:   . Difficulty of Paying Living Expenses:   Food Insecurity:   . Worried About Charity fundraiser in the Last Year:   . Arboriculturist in the Last Year:   Transportation Needs:   . Film/video editor (Medical):   Marland Kitchen Lack of Transportation (Non-Medical):   Physical Activity:   . Days of Exercise per Week:   . Minutes of Exercise per Session:   Stress:   . Feeling of Stress :   Social Connections:   . Frequency of Communication with Friends and Family:   . Frequency of Social Gatherings with Friends and Family:   . Attends Religious Services:   . Active Member of Clubs or Organizations:   . Attends Archivist Meetings:   Marland Kitchen Marital Status:      Family History: The patient's family history includes Macular degeneration in his mother; Thyroid disease in his mother.  ROS:   Please see the history of present illness.    All other systems reviewed and are negative.  EKGs/Labs/Other Studies Reviewed:    The following studies were reviewed today: I discussed my findings with the patient at length   Recent Labs: 11/22/2019: BUN 20; Creatinine, Ser 1.05; Potassium 4.8; Sodium 139  Recent Lipid Panel No results found for: CHOL, TRIG, HDL, CHOLHDL, VLDL, LDLCALC, LDLDIRECT  Physical Exam:    VS:  BP (!) 148/76   Pulse 84   Ht 6\' 4"  (1.93 m)   Wt (!) 318 lb 3.2 oz (144.3 kg)   SpO2 94%   BMI 38.73 kg/m     Wt Readings from Last 3 Encounters:  12/23/19 (!) 318 lb 3.2 oz (144.3 kg)  11/23/19 (!) 311 lb 12.8 oz (141.4 kg)  11/02/19 (!) 309 lb (140.2 kg)     GEN: Patient is in no acute distress HEENT: Normal NECK: No JVD; No carotid bruits LYMPHATICS: No  lymphadenopathy CARDIAC: Hear sounds regular, 2/6 systolic murmur at the apex. RESPIRATORY:  Clear to auscultation without rales, wheezing or rhonchi  ABDOMEN: Soft, non-tender, non-distended MUSCULOSKELETAL:  No edema; No deformity  SKIN: Warm and dry NEUROLOGIC:  Alert and oriented x 3 PSYCHIATRIC:  Normal affect   Signed, Jenean Lindau, MD  12/23/2019 2:39 PM  [facility]

## 2019-12-23 NOTE — Patient Instructions (Signed)

## 2019-12-24 DIAGNOSIS — N401 Enlarged prostate with lower urinary tract symptoms: Secondary | ICD-10-CM | POA: Diagnosis not present

## 2019-12-24 DIAGNOSIS — E291 Testicular hypofunction: Secondary | ICD-10-CM | POA: Diagnosis not present

## 2019-12-24 DIAGNOSIS — Q984 Klinefelter syndrome, unspecified: Secondary | ICD-10-CM | POA: Diagnosis not present

## 2019-12-29 DIAGNOSIS — Z9049 Acquired absence of other specified parts of digestive tract: Secondary | ICD-10-CM | POA: Diagnosis not present

## 2019-12-29 DIAGNOSIS — Q984 Klinefelter syndrome, unspecified: Secondary | ICD-10-CM | POA: Diagnosis not present

## 2019-12-29 DIAGNOSIS — E291 Testicular hypofunction: Secondary | ICD-10-CM | POA: Diagnosis not present

## 2019-12-29 DIAGNOSIS — N200 Calculus of kidney: Secondary | ICD-10-CM | POA: Diagnosis not present

## 2019-12-29 DIAGNOSIS — I878 Other specified disorders of veins: Secondary | ICD-10-CM | POA: Diagnosis not present

## 2019-12-29 DIAGNOSIS — Z79899 Other long term (current) drug therapy: Secondary | ICD-10-CM | POA: Diagnosis not present

## 2019-12-29 DIAGNOSIS — N401 Enlarged prostate with lower urinary tract symptoms: Secondary | ICD-10-CM | POA: Diagnosis not present

## 2020-01-03 DIAGNOSIS — E291 Testicular hypofunction: Secondary | ICD-10-CM | POA: Diagnosis not present

## 2020-01-05 ENCOUNTER — Other Ambulatory Visit: Payer: Self-pay

## 2020-01-05 ENCOUNTER — Other Ambulatory Visit: Payer: Self-pay | Admitting: Sports Medicine

## 2020-01-05 ENCOUNTER — Encounter: Payer: Self-pay | Admitting: Sports Medicine

## 2020-01-05 ENCOUNTER — Ambulatory Visit: Payer: Medicare HMO | Admitting: Sports Medicine

## 2020-01-05 ENCOUNTER — Ambulatory Visit (INDEPENDENT_AMBULATORY_CARE_PROVIDER_SITE_OTHER): Payer: Medicare HMO

## 2020-01-05 DIAGNOSIS — M19072 Primary osteoarthritis, left ankle and foot: Secondary | ICD-10-CM

## 2020-01-05 DIAGNOSIS — M216X2 Other acquired deformities of left foot: Secondary | ICD-10-CM

## 2020-01-05 DIAGNOSIS — M2142 Flat foot [pes planus] (acquired), left foot: Secondary | ICD-10-CM | POA: Diagnosis not present

## 2020-01-05 DIAGNOSIS — G894 Chronic pain syndrome: Secondary | ICD-10-CM

## 2020-01-05 DIAGNOSIS — M79672 Pain in left foot: Secondary | ICD-10-CM

## 2020-01-05 DIAGNOSIS — M2141 Flat foot [pes planus] (acquired), right foot: Secondary | ICD-10-CM

## 2020-01-05 DIAGNOSIS — E088 Diabetes mellitus due to underlying condition with unspecified complications: Secondary | ICD-10-CM

## 2020-01-05 DIAGNOSIS — M7742 Metatarsalgia, left foot: Secondary | ICD-10-CM | POA: Diagnosis not present

## 2020-01-05 DIAGNOSIS — G629 Polyneuropathy, unspecified: Secondary | ICD-10-CM | POA: Diagnosis not present

## 2020-01-05 DIAGNOSIS — M216X1 Other acquired deformities of right foot: Secondary | ICD-10-CM | POA: Diagnosis not present

## 2020-01-05 DIAGNOSIS — M9272 Juvenile osteochondrosis of metatarsus, left foot: Secondary | ICD-10-CM | POA: Diagnosis not present

## 2020-01-05 MED ORDER — PREDNISONE 10 MG (21) PO TBPK
ORAL_TABLET | ORAL | 0 refills | Status: DC
Start: 2020-01-05 — End: 2020-04-11

## 2020-01-05 NOTE — Progress Notes (Signed)
Subjective: Peter Buck is a 64 y.o. male patient who presents to office for evaluation of left foot.  Patient reports that he has had pain that is progressively gotten worse over the last 3 years reports that he had a or injury with fracture metatarsal but never got treated and reports that he has been seeing Dr. Gretta Arab last visit was last month where he received a steroid injection across the top of the foot reports that the injection helped for a little bit but pain is now 10 out of 10 reports that it is sharp pain across the midfoot and it radiates out to the metatarsals states that his foot sometimes collapses under him he never knows when it is going to happen pain is worse when he is walking especially when he is walking down a slope or incline like to his front yard.  Patient reports that he is currently using topical diclofenac to the area and taking hydrocodone as prescribed by pain management as well as using a cane to help him when he is ambulating reports that he also suffers with neuropathy and numbness with the pain across the left foot.  Patient is here for another opinion and also for discussion of further treatment options.  Patient is diabetic and last blood sugar not recorded but last A1c was noted to be 5.9.  Reports that he last saw his PCP Dr. Nyra Capes 1 month ago.  Review of Systems  All other systems reviewed and are negative.   Patient Active Problem List   Diagnosis Date Noted  . Paroxysmal atrial fibrillation ([place]) 12/23/2019  . Essential hypertension 11/02/2019  . Diabetes mellitus due to underlying condition with unspecified complications ([place]) 94/17/4081  . Chronic pain of left knee 10/19/2019  . Primary osteoarthritis of both first carpometacarpal joints 08/25/2019  . Second degree burn of finger of right hand 08/25/2019  . Verruca 08/25/2019  . Therapeutic opioid induced constipation 06/22/2019  . Cervical radiculopathy 02/02/2019  . Chronic pain syndrome 02/02/2019   . Facet arthropathy, cervical 02/02/2019  . Foraminal stenosis of cervical region 02/02/2019  . Neuroforaminal stenosis of lumbar spine 12/17/2018  . Pain medication agreement 10/16/2018  . Pain associated with accessory navicular bone of foot, left 05/25/2018  . Aftercare following surgery 04/23/2018  . Muscle spasm of back 02/17/2018  . DDD (degenerative disc disease), lumbar 11/25/2017  . Low back pain 11/25/2017  . Body mass index (bmi) 38.0-38.9, adult 09/16/2017  . Plantar fasciitis 01/02/2017  . Arthritis, midfoot 12/05/2016  . Borderline personality disorder ([place]) 04/29/2016  . 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 Bipolar affective disorder, depressed ([place]) 02/13/2016  . Comprehensive diabetic foot examination, type 2 DM, encounter for ([place]) 12/25/2015  . Onychomycosis due to dermatophyte 12/25/2015  . Hammertoe 04/26/2014    Current Outpatient Medications on File Prior to Visit  Medication Sig Dispense Refill  . acetaminophen (TYLENOL) 650 MG CR tablet Take 650 mg by mouth every 8 (eight) hours as needed. Or take 650 mg every 5 hours for severe pain    . albuterol (VENTOLIN HFA) 108 (90 Base) MCG/ACT inhaler Inhale 2 puffs into the lungs every 6 (six) hours as needed.    . AMITIZA 24 MCG capsule Take 24 mcg by mouth 2 (two) times daily.    . busPIRone (BUSPAR) 15 MG tablet Take 15 mg by mouth 2 (two) times daily.    . Calcium Citrate-Vitamin D 200-250 MG-UNIT TABS Take 1 tablet by mouth at bedtime.    . celecoxib (  CELEBREX) 200 MG capsule Take 1 capsule by mouth 2 (two) times daily.    . diclofenac Sodium (VOLTAREN) 1 % GEL Apply topically 4 (four) times daily.    Marland Kitchen ELIQUIS 5 MG TABS tablet Take 1 tablet (5 mg total) by mouth 2 (two) times daily. 180 tablet 3  . HYDROcodone-acetaminophen (NORCO/VICODIN) 5-325 MG tablet Take 1 tablet by mouth 2 (two) times daily as needed.    Marland Kitchen LINZESS 72 MCG capsule Take 72 mcg by mouth as needed.    . lovastatin (MEVACOR) 10 MG tablet Take 10  mg by mouth in the morning.    . metFORMIN (GLUCOPHAGE) 1000 MG tablet Take 1,000 mg by mouth 2 (two) times daily.    . mometasone (NASONEX) 50 MCG/ACT nasal spray Place 1 spray into the nose 2 (two) times daily.    . nebivolol (BYSTOLIC) 10 MG tablet Take 10 mg by mouth 2 (two) times daily.    Marland Kitchen OZEMPIC, 1 MG/DOSE, 2 MG/1.5ML SOPN Inject 1 mg into the skin once a week.    . pregabalin (LYRICA) 200 MG capsule Take 200 mg by mouth 2 (two) times daily.    Marland Kitchen testosterone cypionate (DEPOTESTOSTERONE CYPIONATE) 200 MG/ML injection Inject 200 mg into the muscle every 21 ( twenty-one) days.     No current facility-administered medications on file prior to visit.    Allergies  Allergen Reactions  . Augmentin [Amoxicillin-Pot Clavulanate] Anaphylaxis  . Ivp Dye [Iodinated Diagnostic Agents] Anaphylaxis  . Ketorolac Anaphylaxis  . Toradol [Ketorolac Tromethamine] Anaphylaxis    "It put me in shock.  My blood pressure tanked and I was itchy all over; had to go to the ER."  . Atorvastatin Other (See Comments)    Severe back ache Severe back ache Severe back ache Severe back ache  . Guaifenesin Other (See Comments) and Swelling    "It sorta made my throat close up." "It sorta made my throat close up." "It sorta made my throat close up."  . Penicillins Dermatitis  . Dextromethorphan   . Pseudoephedrine Other (See Comments)    "It dries me out.  I take it when my other allergy medicine isn't working."    Objective:  General: Alert and oriented x3 in no acute distress  Dermatology: No open lesions bilateral lower extremities, no webspace macerations, no ecchymosis bilateral, all nails x 10 are short and thickened consistent with onychomycosis.  Vascular: Dorsalis Pedis and Posterior Tibial pedal pulses palpable 1 out of 4 bilateral, Capillary Fill Time 5 seconds,(+) scant pedal hair growth bilateral, trace edema bilateral lower extremities, mild varicosities bilateral, temperature gradient  within normal limits.  Neurology: Gross sensation intact via light touch bilateral, subjective numbness bilateral with a history of neuropathy.  Musculoskeletal: Mild tenderness with palpation at left midfoot that worsens at the third and second metatarsal shafts of the left foot, there is severe fixed pes planus deformity with midtarsal collapse left greater than right, there is limited ankle range of motion supportive of equinus deformity bilateral.  Gait: Antalgic gait  Xrays  Left foot   Impression: Diffuse arthritis worse at the midfoot and at the metatarsal bases of the first and second metatarsals there is significant sclerotic changes of the second metatarsal with squaring of the metatarsal head likely from old remote injury, midtarsal breech supportive of pes planus deformity with superimposed arthritis, there are areas bilateral ankle valgus is noted supportive of pes planus valgus likely with early ankle involvement.  Assessment and Plan: Problem List Items  Addressed This Visit      Endocrine   Diabetes mellitus due to underlying condition with unspecified complications (HCC)     Other   Chronic pain syndrome   Relevant Medications   predniSONE (STERAPRED UNI-PAK 21 TAB) 10 MG (21) TBPK tablet    Other Visit Diagnoses    Arthritis of midtarsal joint of left foot    -  Primary   Relevant Medications   predniSONE (STERAPRED UNI-PAK 21 TAB) 10 MG (21) TBPK tablet   Freiberg's disease, left       Metatarsalgia of left foot       Neuropathy       Pes planus of both feet       Acquired equinus deformity of both feet          -Complete examination performed -Xrays reviewed -Discussed treatment options for ongoing left foot pain with significant arthritis and deformity -No reinjection given at this time since patient just recently had an injection is administered by Dr. Gretta Arab without any additional relief -Ordered MRI for further evaluation advised patient that healing from  any type of surgery may take 16 to 20 weeks however cannot guarantee any outcomes and advised patient that he may benefit from rehab placement if we do decide to do anything surgical for his left foot however at this time due to chronicity of his symptoms and his risk with diabetes and cardiac problems currently on blood thinners he may not be a great candidate for anything surgical at this point we will order a MRI for further evaluation to rule out any ligamentous involvement since patient has weakness and instability especially when he is walking down heel advised patient if MRI is negative for any type of tendon or ligament involvement may benefit from physical therapy however at this time we will proceed with MRI -Prescribed Medrol Dosepak for patient to take as instructed and advised patient during this phase he may see slight elevation in his blood sugars however since his blood sugars are well controlled and this is a short-term medication we are doing this temporarily to see if this will give him some additional relief while he continues with his chronic pain medicine of hydrocodone topical pain cream of diclofenac as well as his pregabalin for nerves -Advised good supportive shoes daily for foot type -Patient to return to office after MRI or sooner if condition worsens.  Landis Martins, DPM

## 2020-01-06 ENCOUNTER — Telehealth: Payer: Self-pay | Admitting: *Deleted

## 2020-01-06 DIAGNOSIS — M19072 Primary osteoarthritis, left ankle and foot: Secondary | ICD-10-CM

## 2020-01-06 DIAGNOSIS — M7742 Metatarsalgia, left foot: Secondary | ICD-10-CM

## 2020-01-06 DIAGNOSIS — T148XXA Other injury of unspecified body region, initial encounter: Secondary | ICD-10-CM

## 2020-01-06 DIAGNOSIS — M9272 Juvenile osteochondrosis of metatarsus, left foot: Secondary | ICD-10-CM

## 2020-01-06 NOTE — Telephone Encounter (Signed)
Faxed to Dr. Leeanne Rio assistant in [place] for pre-cert and scheduling.

## 2020-01-06 NOTE — Telephone Encounter (Signed)
-----   Message from [place], [place] sent at 01/05/2020  8:05 PM EDT ----- Regarding: Left foot MRI MRI evaluate for ligament sprain or tear 3-year history of foot pain and severe arthritis with progression

## 2020-01-11 ENCOUNTER — Other Ambulatory Visit: Payer: Self-pay | Admitting: Sports Medicine

## 2020-01-11 DIAGNOSIS — M19072 Primary osteoarthritis, left ankle and foot: Secondary | ICD-10-CM

## 2020-01-11 NOTE — Telephone Encounter (Signed)
MRI for CPT [phone] does require pre-authorizaqtion through [place].  Medical record was sent to [facility] to fax number [phone] -waiting on response

## 2020-01-12 DIAGNOSIS — E291 Testicular hypofunction: Secondary | ICD-10-CM | POA: Diagnosis not present

## 2020-01-14 NOTE — Telephone Encounter (Signed)
Probation officer reviewed the request for Peter Buck to have MRI of LT foot, upon review of the additional information faxed to [facility] the medical director was unable to approve the service requested and a peer to peer needs to be performed.  Peer to peer can be performed by calling [phone] selection option 4 and provide reference number [id number] Needs to be performed before 01/18/20

## 2020-01-14 NOTE — Telephone Encounter (Signed)
MRI was already approved when I called.  Authorization  [id number].  Good through July 30 to July 12, 2020

## 2020-01-14 NOTE — Telephone Encounter (Signed)
Contacted [facility] and made appt for Aug-4th checking in at 7:15 for 7:30 appt. Pt was notified of app date and time and order was sent to [place]

## 2020-01-16 DIAGNOSIS — I482 Chronic atrial fibrillation, unspecified: Secondary | ICD-10-CM | POA: Diagnosis not present

## 2020-01-16 DIAGNOSIS — I152 Hypertension secondary to endocrine disorders: Secondary | ICD-10-CM | POA: Diagnosis not present

## 2020-01-16 DIAGNOSIS — Z6837 Body mass index (BMI) 37.0-37.9, adult: Secondary | ICD-10-CM | POA: Diagnosis not present

## 2020-01-18 DIAGNOSIS — H2512 Age-related nuclear cataract, left eye: Secondary | ICD-10-CM | POA: Diagnosis not present

## 2020-01-19 ENCOUNTER — Encounter: Payer: Self-pay | Admitting: Sports Medicine

## 2020-01-19 DIAGNOSIS — M19072 Primary osteoarthritis, left ankle and foot: Secondary | ICD-10-CM | POA: Diagnosis not present

## 2020-01-19 DIAGNOSIS — R262 Difficulty in walking, not elsewhere classified: Secondary | ICD-10-CM | POA: Diagnosis not present

## 2020-01-19 DIAGNOSIS — R600 Localized edema: Secondary | ICD-10-CM | POA: Diagnosis not present

## 2020-01-24 DIAGNOSIS — R69 Illness, unspecified: Secondary | ICD-10-CM | POA: Diagnosis not present

## 2020-02-02 DIAGNOSIS — E291 Testicular hypofunction: Secondary | ICD-10-CM | POA: Diagnosis not present

## 2020-02-06 DIAGNOSIS — H1013 Acute atopic conjunctivitis, bilateral: Secondary | ICD-10-CM | POA: Diagnosis not present

## 2020-02-10 DIAGNOSIS — H0102B Squamous blepharitis left eye, upper and lower eyelids: Secondary | ICD-10-CM | POA: Diagnosis not present

## 2020-02-15 DIAGNOSIS — I152 Hypertension secondary to endocrine disorders: Secondary | ICD-10-CM | POA: Diagnosis not present

## 2020-02-15 DIAGNOSIS — Z6837 Body mass index (BMI) 37.0-37.9, adult: Secondary | ICD-10-CM | POA: Diagnosis not present

## 2020-02-15 DIAGNOSIS — E782 Mixed hyperlipidemia: Secondary | ICD-10-CM | POA: Diagnosis not present

## 2020-02-15 DIAGNOSIS — I482 Chronic atrial fibrillation, unspecified: Secondary | ICD-10-CM | POA: Diagnosis not present

## 2020-02-16 ENCOUNTER — Ambulatory Visit: Payer: Medicare HMO | Admitting: Sports Medicine

## 2020-02-16 ENCOUNTER — Encounter: Payer: Self-pay | Admitting: Sports Medicine

## 2020-02-16 ENCOUNTER — Other Ambulatory Visit: Payer: Self-pay

## 2020-02-16 DIAGNOSIS — M216X2 Other acquired deformities of left foot: Secondary | ICD-10-CM | POA: Diagnosis not present

## 2020-02-16 DIAGNOSIS — M9272 Juvenile osteochondrosis of metatarsus, left foot: Secondary | ICD-10-CM | POA: Diagnosis not present

## 2020-02-16 DIAGNOSIS — G629 Polyneuropathy, unspecified: Secondary | ICD-10-CM

## 2020-02-16 DIAGNOSIS — M2141 Flat foot [pes planus] (acquired), right foot: Secondary | ICD-10-CM | POA: Diagnosis not present

## 2020-02-16 DIAGNOSIS — M19072 Primary osteoarthritis, left ankle and foot: Secondary | ICD-10-CM

## 2020-02-16 DIAGNOSIS — M2142 Flat foot [pes planus] (acquired), left foot: Secondary | ICD-10-CM | POA: Diagnosis not present

## 2020-02-16 DIAGNOSIS — M7742 Metatarsalgia, left foot: Secondary | ICD-10-CM

## 2020-02-16 DIAGNOSIS — M216X1 Other acquired deformities of right foot: Secondary | ICD-10-CM

## 2020-02-16 DIAGNOSIS — E088 Diabetes mellitus due to underlying condition with unspecified complications: Secondary | ICD-10-CM

## 2020-02-16 DIAGNOSIS — G894 Chronic pain syndrome: Secondary | ICD-10-CM | POA: Diagnosis not present

## 2020-02-16 NOTE — Progress Notes (Signed)
Subjective: Peter Buck is a 64 y.o. male patient who presents to office for follow-up evaluation of left foot pain and for discussion of MRI results.  Patient reports that he is doing very well pain feels better depending on how much walking or standing that he does he does experience some swelling and numbness.  Patient reports that he went to a pastor who prayed and put an oil on his foot that seem to help.  Patient denies any other changes with medication or medical history since last encounter.  Patient is diabetic and last blood sugar not recorded previous A1c was noted to be 5.9.  Patient Active Problem List   Diagnosis Date Noted   Paroxysmal atrial fibrillation ([place]) 12/23/2019   Essential hypertension 11/02/2019   Diabetes mellitus due to underlying condition with unspecified complications ([place]) 83/66/2947   Chronic pain of left knee 10/19/2019   Primary osteoarthritis of both first carpometacarpal joints 08/25/2019   Second degree burn of finger of right hand 08/25/2019   Verruca 08/25/2019   Therapeutic opioid induced constipation 06/22/2019   Cervical radiculopathy 02/02/2019   Chronic pain syndrome 02/02/2019   Facet arthropathy, cervical 02/02/2019   Foraminal stenosis of cervical region 02/02/2019   Neuroforaminal stenosis of lumbar spine 12/17/2018   Pain medication agreement 10/16/2018   Pain associated with accessory navicular bone of foot, left 05/25/2018   Aftercare following surgery 04/23/2018   Muscle spasm of back 02/17/2018   DDD (degenerative disc disease), lumbar 11/25/2017   Low back pain 11/25/2017   Body mass index (bmi) 38.0-38.9, adult 09/16/2017   Plantar fasciitis 01/02/2017   Arthritis, midfoot 12/05/2016   Borderline personality disorder ([place]) 04/29/2016   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   Bipolar affective disorder, depressed ([place]) 02/13/2016   Comprehensive diabetic foot examination, type 2 DM, encounter for ([place])  12/25/2015   Onychomycosis due to dermatophyte 12/25/2015   Hammertoe 04/26/2014    Current Outpatient Medications on File Prior to Visit  Medication Sig Dispense Refill   acetaminophen (TYLENOL) 650 MG CR tablet Take 650 mg by mouth every 8 (eight) hours as needed. Or take 650 mg every 5 hours for severe pain     albuterol (VENTOLIN HFA) 108 (90 Base) MCG/ACT inhaler Inhale 2 puffs into the lungs every 6 (six) hours as needed.     AMITIZA 24 MCG capsule Take 24 mcg by mouth 2 (two) times daily.     busPIRone (BUSPAR) 15 MG tablet Take 15 mg by mouth 2 (two) times daily.     Calcium Citrate-Vitamin D 200-250 MG-UNIT TABS Take 1 tablet by mouth at bedtime.     celecoxib (CELEBREX) 200 MG capsule Take 1 capsule by mouth 2 (two) times daily.     diclofenac Sodium (VOLTAREN) 1 % GEL Apply topically 4 (four) times daily.     ELIQUIS 5 MG TABS tablet Take 1 tablet (5 mg total) by mouth 2 (two) times daily. 180 tablet 3   LINZESS 72 MCG capsule Take 72 mcg by mouth as needed.     lovastatin (MEVACOR) 10 MG tablet Take 10 mg by mouth in the morning.     metFORMIN (GLUCOPHAGE) 1000 MG tablet Take 1,000 mg by mouth 2 (two) times daily.     mometasone (NASONEX) 50 MCG/ACT nasal spray Place 1 spray into the nose 2 (two) times daily.     nebivolol (BYSTOLIC) 10 MG tablet Take 10 mg by mouth 2 (two) times daily.     OZEMPIC, 1 MG/DOSE, 2 MG/1.5ML SOPN  Inject 1 mg into the skin once a week.     predniSONE (STERAPRED UNI-PAK 21 TAB) 10 MG (21) TBPK tablet Take as directed 21 tablet 0   pregabalin (LYRICA) 200 MG capsule Take 200 mg by mouth 2 (two) times daily.     testosterone cypionate (DEPOTESTOSTERONE CYPIONATE) 200 MG/ML injection Inject 200 mg into the muscle every 21 ( twenty-one) days.     No current facility-administered medications on file prior to visit.    Allergies  Allergen Reactions   Augmentin [Amoxicillin-Pot Clavulanate] Anaphylaxis   Ivp Dye [Iodinated  Diagnostic Agents] Anaphylaxis   Ketorolac Anaphylaxis   Toradol [Ketorolac Tromethamine] Anaphylaxis    "It put me in shock.  My blood pressure tanked and I was itchy all over; had to go to the ER."   Atorvastatin Other (See Comments)    Severe back ache Severe back ache Severe back ache Severe back ache   Guaifenesin Other (See Comments) and Swelling    "It sorta made my throat close up." "It sorta made my throat close up." "It sorta made my throat close up."   Penicillins Dermatitis   Dextromethorphan    Pseudoephedrine Other (See Comments)    "It dries me out.  I take it when my other allergy medicine isn't working."    Objective:  General: Alert and oriented x3 in no acute distress  Dermatology: No open lesions bilateral lower extremities, no webspace macerations, no ecchymosis bilateral, all nails x 10 are short and thickened consistent with onychomycosis.  Vascular: Dorsalis Pedis and Posterior Tibial pedal pulses palpable 1 out of 4 bilateral, Capillary Fill Time 5 seconds,(+) scant pedal hair growth bilateral, trace edema bilateral lower extremities, mild varicosities bilateral, temperature gradient within normal limits.  Neurology: Gross sensation intact via light touch bilateral, subjective numbness bilateral with a history of neuropathy as previously noted.  Musculoskeletal: Minimal tenderness with palpation at left midfoot, there is severe fixed pes planus deformity with midtarsal collapse left greater than right, there is limited ankle range of motion supportive of equinus deformity bilateral.  MRI consistent with severe arthritis at multiple joints occluding the second toe joint and the midtarsal joint at the bases of the first and second metatarsals with atrophy of muscles  Assessment and Plan: Problem List Items Addressed This Visit      Endocrine   Diabetes mellitus due to underlying condition with unspecified complications ([place])     Other   Chronic  pain syndrome    Other Visit Diagnoses    Arthritis of midtarsal joint of left foot    -  Primary   Freiberg's disease, left       Metatarsalgia of left foot       Neuropathy       Pes planus of both feet       Acquired equinus deformity of both feet          -Complete examination performed -MRI results reviewed with patient -Discussed treatment options for ongoing left foot pain with significant arthritis and deformity -No surgery is indicated at this time -Rx PT for arthritis and mobility -Continue with Tylenol PRN pain -Continue with pregabalin for neuropathy -Advised good supportive shoes daily for foot type; Recommended patient to try to get New Balance or at next office visit we will assess him for diabetic shoes  -Patient to return to office in 4-6 weeks or sooner if condition worsens.  Landis Martins, DPM

## 2020-02-17 NOTE — Addendum Note (Signed)
Addended by: Landis Martins T on: 02/17/2020 08:15 AM   Modules accepted: Orders

## 2020-02-18 DIAGNOSIS — M6281 Muscle weakness (generalized): Secondary | ICD-10-CM | POA: Diagnosis not present

## 2020-02-18 DIAGNOSIS — R2689 Other abnormalities of gait and mobility: Secondary | ICD-10-CM | POA: Diagnosis not present

## 2020-02-18 DIAGNOSIS — M25572 Pain in left ankle and joints of left foot: Secondary | ICD-10-CM | POA: Diagnosis not present

## 2020-02-18 DIAGNOSIS — M79672 Pain in left foot: Secondary | ICD-10-CM | POA: Diagnosis not present

## 2020-02-24 DIAGNOSIS — M79672 Pain in left foot: Secondary | ICD-10-CM | POA: Diagnosis not present

## 2020-02-24 DIAGNOSIS — M25572 Pain in left ankle and joints of left foot: Secondary | ICD-10-CM | POA: Diagnosis not present

## 2020-02-24 DIAGNOSIS — M6281 Muscle weakness (generalized): Secondary | ICD-10-CM | POA: Diagnosis not present

## 2020-02-24 DIAGNOSIS — R2689 Other abnormalities of gait and mobility: Secondary | ICD-10-CM | POA: Diagnosis not present

## 2020-02-29 DIAGNOSIS — E291 Testicular hypofunction: Secondary | ICD-10-CM | POA: Diagnosis not present

## 2020-03-07 DIAGNOSIS — G4733 Obstructive sleep apnea (adult) (pediatric): Secondary | ICD-10-CM | POA: Diagnosis not present

## 2020-03-07 DIAGNOSIS — Z915 Personal history of self-harm: Secondary | ICD-10-CM | POA: Diagnosis not present

## 2020-03-07 DIAGNOSIS — I4819 Other persistent atrial fibrillation: Secondary | ICD-10-CM | POA: Diagnosis not present

## 2020-03-07 DIAGNOSIS — L853 Xerosis cutis: Secondary | ICD-10-CM | POA: Diagnosis not present

## 2020-03-07 DIAGNOSIS — I10 Essential (primary) hypertension: Secondary | ICD-10-CM | POA: Diagnosis not present

## 2020-03-07 DIAGNOSIS — M1990 Unspecified osteoarthritis, unspecified site: Secondary | ICD-10-CM | POA: Diagnosis not present

## 2020-03-07 DIAGNOSIS — E119 Type 2 diabetes mellitus without complications: Secondary | ICD-10-CM | POA: Diagnosis not present

## 2020-03-07 DIAGNOSIS — G2581 Restless legs syndrome: Secondary | ICD-10-CM | POA: Diagnosis not present

## 2020-03-07 DIAGNOSIS — E785 Hyperlipidemia, unspecified: Secondary | ICD-10-CM | POA: Diagnosis not present

## 2020-03-07 DIAGNOSIS — R45850 Homicidal ideations: Secondary | ICD-10-CM | POA: Diagnosis not present

## 2020-03-07 DIAGNOSIS — I4891 Unspecified atrial fibrillation: Secondary | ICD-10-CM | POA: Diagnosis not present

## 2020-03-07 DIAGNOSIS — I4821 Permanent atrial fibrillation: Secondary | ICD-10-CM | POA: Diagnosis not present

## 2020-03-07 DIAGNOSIS — Z7901 Long term (current) use of anticoagulants: Secondary | ICD-10-CM | POA: Diagnosis not present

## 2020-03-07 DIAGNOSIS — R69 Illness, unspecified: Secondary | ICD-10-CM | POA: Diagnosis not present

## 2020-03-07 DIAGNOSIS — R45851 Suicidal ideations: Secondary | ICD-10-CM | POA: Diagnosis not present

## 2020-03-07 DIAGNOSIS — R9431 Abnormal electrocardiogram [ECG] [EKG]: Secondary | ICD-10-CM | POA: Diagnosis not present

## 2020-03-08 DIAGNOSIS — I4821 Permanent atrial fibrillation: Secondary | ICD-10-CM | POA: Diagnosis not present

## 2020-03-09 ENCOUNTER — Telehealth: Payer: Self-pay | Admitting: Cardiology

## 2020-03-09 NOTE — Telephone Encounter (Signed)
We will let them manage his condition and then review all the reports and medicines when he is back.

## 2020-03-09 NOTE — Telephone Encounter (Signed)
Spoke with staff at facility and the MD who is seeing the patient wants to make sure that he doesn't need any additional meds. He is in afib with a rate in the 80's. He is on metoprolol and eliquis. How do you advise?

## 2020-03-09 NOTE — Telephone Encounter (Signed)
Patient c/o Palpitations:  High priority if patient c/o lightheadedness, shortness of breath, or chest pain  1) How long have you had palpitations/irregular HR/ Afib? Are you having the symptoms now? [facility] is not sure. Has been in afib since admission 09/21. Admission EKG showed Afib with HR 84 bpm  2) Are you currently experiencing lightheadedness, SOB or CP? no  3) Do you have a history of afib (atrial fibrillation) or irregular heart rhythm? Facility is not sure   4) Have you checked your BP or HR? (document readings if available): 131/85 HR 81  5) Are you experiencing any other symptoms? No   Pt was admitted to [facility] with Afib. Charge Nurse wanted to know if the patient needs to be on any medication to control afib. Please call  [phone] and ask for the Charge Nurse

## 2020-03-09 NOTE — Telephone Encounter (Signed)
New Message:     On Monday pt voluntarily admitted himself to [facility] in [place] [phone] If you need to give them information please use this code- [phone] Pt He is back in Atrial Fib. Pt carried his medicine with him to the hospital, they will not use them.He is receiving a replacement for the blood pressure that Dr Geraldo Pitter had him on.  He wants Dr Geraldo Pitter to please call and talk to somebody there about his medicine. Please have him use this code [phone] so he can talk to somebody please.

## 2020-03-10 DIAGNOSIS — R69 Illness, unspecified: Secondary | ICD-10-CM | POA: Diagnosis not present

## 2020-03-10 DIAGNOSIS — R45850 Homicidal ideations: Secondary | ICD-10-CM | POA: Diagnosis not present

## 2020-03-10 DIAGNOSIS — Z915 Personal history of self-harm: Secondary | ICD-10-CM | POA: Diagnosis not present

## 2020-03-10 DIAGNOSIS — G2581 Restless legs syndrome: Secondary | ICD-10-CM | POA: Diagnosis not present

## 2020-03-10 DIAGNOSIS — M1990 Unspecified osteoarthritis, unspecified site: Secondary | ICD-10-CM | POA: Diagnosis not present

## 2020-03-10 DIAGNOSIS — Z7901 Long term (current) use of anticoagulants: Secondary | ICD-10-CM | POA: Diagnosis not present

## 2020-03-10 DIAGNOSIS — I10 Essential (primary) hypertension: Secondary | ICD-10-CM | POA: Diagnosis not present

## 2020-03-10 DIAGNOSIS — E785 Hyperlipidemia, unspecified: Secondary | ICD-10-CM | POA: Diagnosis not present

## 2020-03-10 DIAGNOSIS — I4891 Unspecified atrial fibrillation: Secondary | ICD-10-CM | POA: Diagnosis not present

## 2020-03-10 DIAGNOSIS — R45851 Suicidal ideations: Secondary | ICD-10-CM | POA: Diagnosis not present

## 2020-03-10 NOTE — Telephone Encounter (Signed)
That seems reasonable to me.

## 2020-03-10 NOTE — Telephone Encounter (Signed)
Left voicemail message to call back  

## 2020-03-11 DIAGNOSIS — R69 Illness, unspecified: Secondary | ICD-10-CM | POA: Diagnosis not present

## 2020-03-14 ENCOUNTER — Ambulatory Visit: Payer: Medicare HMO | Admitting: Sports Medicine

## 2020-03-17 ENCOUNTER — Telehealth: Payer: Self-pay | Admitting: Cardiology

## 2020-03-17 DIAGNOSIS — E782 Mixed hyperlipidemia: Secondary | ICD-10-CM | POA: Diagnosis not present

## 2020-03-17 DIAGNOSIS — I152 Hypertension secondary to endocrine disorders: Secondary | ICD-10-CM | POA: Diagnosis not present

## 2020-03-17 DIAGNOSIS — I482 Chronic atrial fibrillation, unspecified: Secondary | ICD-10-CM | POA: Diagnosis not present

## 2020-03-17 NOTE — Telephone Encounter (Signed)
Continue current medications as prescribed by those doctors.  And see me in appointment in the next couple of weeks.  Please get authorization from them for release of records so we can obtain them before his visit.

## 2020-03-17 NOTE — Telephone Encounter (Signed)
Patient is calling because he recently in the hospital in [place].  He was on nebivolol (BYSTOLIC) 10 MG tablet, they told to stop taking that, and they started him on metoprolol 25mg  1 x daily  and oxcarbazepime 300mg  2x's daily. They also told him he was back in A-Fib. He wants to know if he should continue taking those new meds.

## 2020-03-20 DIAGNOSIS — Z01818 Encounter for other preprocedural examination: Secondary | ICD-10-CM | POA: Diagnosis not present

## 2020-03-20 DIAGNOSIS — H2512 Age-related nuclear cataract, left eye: Secondary | ICD-10-CM | POA: Diagnosis not present

## 2020-03-20 DIAGNOSIS — H52222 Regular astigmatism, left eye: Secondary | ICD-10-CM | POA: Diagnosis not present

## 2020-03-20 DIAGNOSIS — E119 Type 2 diabetes mellitus without complications: Secondary | ICD-10-CM | POA: Diagnosis not present

## 2020-03-24 ENCOUNTER — Telehealth: Payer: Self-pay | Admitting: Cardiology

## 2020-03-24 NOTE — Telephone Encounter (Signed)
Recommendations reviewed with pt as per Dr. Revankar's note.  Pt verbalized understanding and had no additional questions.   

## 2020-03-24 NOTE — Telephone Encounter (Signed)
Pt states that since the inpatient changes his BP medication from Bystolic to Metoprolol his BP has been running high. Pt denies chest pain or other sx. BP last pm was 166/95 and this am 161/101. Pt states that this was before medication. Pt wants to go back on the bystolic. Pt states that it was only changed in the hospital as they did not have bystolic on formulary. How do you advise?

## 2020-03-24 NOTE — Telephone Encounter (Signed)
Pt c/o BP issue: STAT if pt c/o blurred vision, one-sided weakness or slurred speech  1. What are your last 5 BP readings? 160/101 this morning and 166/95 last night.   2. Are you having any other symptoms (ex. Dizziness, headache, blurred vision, passed out)? fatigue  3. What is your BP issue? BP is running high. Patient does have a BP machine but it is only showing him one reading. He happened to write down last nights reading and this mornings. Please advise.

## 2020-03-24 NOTE — Telephone Encounter (Signed)
Yes patient can go back on the same dose of Bystolic and stop the other beta-blocker and keep a track of blood pressures and let us know.

## 2020-03-27 DIAGNOSIS — H52201 Unspecified astigmatism, right eye: Secondary | ICD-10-CM | POA: Diagnosis not present

## 2020-03-27 DIAGNOSIS — H2511 Age-related nuclear cataract, right eye: Secondary | ICD-10-CM | POA: Diagnosis not present

## 2020-04-03 DIAGNOSIS — R69 Illness, unspecified: Secondary | ICD-10-CM | POA: Diagnosis not present

## 2020-04-10 ENCOUNTER — Other Ambulatory Visit: Payer: Self-pay

## 2020-04-10 DIAGNOSIS — E119 Type 2 diabetes mellitus without complications: Secondary | ICD-10-CM | POA: Insufficient documentation

## 2020-04-10 DIAGNOSIS — D649 Anemia, unspecified: Secondary | ICD-10-CM | POA: Insufficient documentation

## 2020-04-10 DIAGNOSIS — K219 Gastro-esophageal reflux disease without esophagitis: Secondary | ICD-10-CM | POA: Insufficient documentation

## 2020-04-10 DIAGNOSIS — G4730 Sleep apnea, unspecified: Secondary | ICD-10-CM | POA: Insufficient documentation

## 2020-04-10 DIAGNOSIS — J45909 Unspecified asthma, uncomplicated: Secondary | ICD-10-CM | POA: Insufficient documentation

## 2020-04-10 DIAGNOSIS — F319 Bipolar disorder, unspecified: Secondary | ICD-10-CM | POA: Insufficient documentation

## 2020-04-10 DIAGNOSIS — M1990 Unspecified osteoarthritis, unspecified site: Secondary | ICD-10-CM | POA: Insufficient documentation

## 2020-04-10 DIAGNOSIS — I10 Essential (primary) hypertension: Secondary | ICD-10-CM | POA: Insufficient documentation

## 2020-04-11 ENCOUNTER — Ambulatory Visit: Payer: Medicare HMO | Admitting: Cardiology

## 2020-04-11 ENCOUNTER — Other Ambulatory Visit: Payer: Self-pay

## 2020-04-11 ENCOUNTER — Encounter: Payer: Self-pay | Admitting: Cardiology

## 2020-04-11 VITALS — BP 140/78 | HR 76 | Ht 76.0 in | Wt 323.8 lb

## 2020-04-11 DIAGNOSIS — I10 Essential (primary) hypertension: Secondary | ICD-10-CM

## 2020-04-11 DIAGNOSIS — E088 Diabetes mellitus due to underlying condition with unspecified complications: Secondary | ICD-10-CM

## 2020-04-11 DIAGNOSIS — I480 Paroxysmal atrial fibrillation: Secondary | ICD-10-CM

## 2020-04-11 DIAGNOSIS — G4730 Sleep apnea, unspecified: Secondary | ICD-10-CM | POA: Diagnosis not present

## 2020-04-11 NOTE — Patient Instructions (Signed)

## 2020-04-11 NOTE — Progress Notes (Signed)
Cardiology Office Note:    Date:  04/11/2020   ID:  Peter Buck, DOB [birth year], MRN [id number]  PCP:  [name], MD  Cardiologist:  Jenean Lindau, MD   Referring MD: [name], MD    ASSESSMENT:    1. Paroxysmal atrial fibrillation (HCC)   2. Essential hypertension   3. Sleep apnea, unspecified type   4. Diabetes mellitus due to underlying condition with unspecified complications ([place])   5. Morbid obesity ([place])    PLAN:    In order of problems listed above:  1. Paroxysmal atrial fibrillation:I discussed with the patient atrial fibrillation, disease process. Management and therapy including rate and rhythm control, anticoagulation benefits and potential risks were discussed extensively with the patient. Patient had multiple questions which were answered to patient's satisfaction.  Patient mentions to me that he is surprised to know that he is in atrial fibrillation.  It does not bother him much.  He has no issues of palpitations or dyspnea on exertion.  We will continue medical therapy. 2. Morbid obesity: Weight reduction was stressed and he promises to do better.  He has gained significant amount of weight in the past several months.  He leads a sedentary lifestyle.  Diet and exercise stressed. 3. Essential hypertension: Blood pressure stable and his home blood pressure readings are fine 4. Mixed dyslipidemia and diabetes mellitus: Diet was urged again he is going to plan to do this in a better way.  Risks explained and he promises to do better.  He will be in touch with his primary care physician for this. 5. Sleep apnea: Sleep health issues were discussed and he appears to be using his sleep apnea apparatus appropriately and is compliant with this. 6. Patient will be seen in follow-up appointment in 3 months or earlier if the patient has any concerns    Medication Adjustments/Labs and Tests Ordered: Current medicines are reviewed at length with the patient  today.  Concerns regarding medicines are outlined above.  Orders Placed This Encounter  Procedures  . EKG 12-Lead   No orders of the defined types were placed in this encounter.    No chief complaint on file.    History of Present Illness:    Peter Buck is a 64 y.o. male.  Patient has past medical history of paroxysmal atrial fibrillation essential hypertension dyslipidemia diabetes mellitus and morbid obesity.  He denies any problems at this time and takes care of activities of daily living.  No chest pain orthopnea or PND.  He is on anticoagulation.  At the time of my evaluation, the patient is alert awake oriented and in no distress.  He has been lax with his diet and leads a sedentary lifestyle.  Past Medical History:  Diagnosis Date  . Aftercare following surgery 04/23/2018  . Anemia   . Arthritis   . Arthritis, midfoot 12/05/2016  . Asthma   . Bipolar affective disorder, depressed ([place]) 02/13/2016  . Bipolar disorder ([place])   . Body mass index (bmi) 38.0-38.9, adult 09/16/2017  . Body mass index (BMI) 38.0-38.9, adult 09/16/2017   Formatting of this note might be different from the original. IMO 10/01 Updates  . Borderline personality disorder ([place]) 04/29/2016  . Cervical radiculopathy 02/02/2019   Last Assessment & Plan:  Formatting of this note might be different from the original. MRI of the cervical spine shows cervical spondylosis with facet arthrosis most notable for mild to moderate left neural foraminal stenosis at C3-4. Per patient, nerve conduction  studies showed right C7-8 radiculopathy.   Continue with current regimen as outlined in lumbar degenerative disc plan.  . Chronic pain of left knee 10/19/2019  . Chronic pain syndrome 02/02/2019  . Comprehensive diabetic foot examination, type 2 DM, encounter for ([place]) 12/25/2015  . DDD (degenerative disc disease), lumbar 11/25/2017   Last Assessment & Plan:  Formatting of this note might be different from the original.  65 year old male with chronic, predominantly left-sided low back pain with radiation into the left hip and groin.  Today we reviewed his recently updated lumbar MRI which shows impingement of the left L2 nerve root due to broad-based disc bulging lateralizing to the left and projecting into the far left lateral   . Diabetes mellitus due to underlying condition with unspecified complications ([place]) 2/95/2841  . Diabetes mellitus without complication ([place])   . Essential hypertension 11/02/2019  . Facet arthropathy, cervical 02/02/2019  . Foraminal stenosis of cervical region 02/02/2019  . GERD (gastroesophageal reflux disease)   . Hammertoe 04/26/2014  . Hypertension   . 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 Low back pain 11/25/2017   Last Assessment & Plan:  [name] is a 64 y.o. male with Low back pain, and acute on chronic low back pain with left hip, knee, and foot pain.  Today I will treat with the patient's current medication of tramadol and diclofenac and add relafen (temporarily d/c diclofenac) for 10 days and increase lyrica. He will continue with his current appt with his neurosurgery.  [name] will return to   . Muscle spasm of back 02/17/2018   Last Assessment & Plan:  Formatting of this note might be different from the original. Continue with tizanidine 4 mg as needed for muscle spasming up to 3 times daily.  . Neuroforaminal stenosis of lumbar spine 12/17/2018  . Onychomycosis due to dermatophyte 12/25/2015  . Pain associated with accessory navicular bone of foot, left 05/25/2018  . Pain medication agreement 10/16/2018   Last Assessment & Plan:  Formatting of this note might be different from the original. Review of prior UDS results are within normal limits.  [place] controlled substance registry reviewed and there were no inconsistencies noted.  . Paroxysmal atrial fibrillation ([place]) 12/23/2019  . Plantar fasciitis 01/02/2017  . Primary osteoarthritis of both first carpometacarpal joints  08/25/2019  . Second degree burn of finger of right hand 08/25/2019  . Sleep apnea   . Therapeutic opioid induced constipation 06/22/2019   Last Assessment & Plan:  Formatting of this note might be different from the original. Continue amitiza 19mcg b.i.d.  . Verruca 08/25/2019    Past Surgical History:  Procedure Laterality Date  . APPENDECTOMY    . CHOLECYSTECTOMY    . INNER EAR SURGERY    . KNEE ARTHROSCOPY      Current Medications: Current Meds  Medication Sig  . acetaminophen (TYLENOL) 650 MG CR tablet Take 650 mg by mouth every 8 (eight) hours as needed. Or take 650 mg every 5 hours for severe pain  . albuterol (VENTOLIN HFA) 108 (90 Base) MCG/ACT inhaler Inhale 2 puffs into the lungs every 6 (six) hours as needed.  . Calcium Citrate-Vitamin D 200-250 MG-UNIT TABS Take 1 tablet by mouth at bedtime.  . chlorpheniramine (CHLOR-TRIMETON) 4 MG tablet Take 4 mg by mouth daily.  . diclofenac Sodium (VOLTAREN) 1 % GEL Apply topically 4 (four) times daily.  Marland Kitchen ELIQUIS 5 MG TABS tablet Take 1 tablet (5 mg total) by mouth 2 (two) times  daily.  Marland Kitchen HYDROcodone-acetaminophen (NORCO/VICODIN) 5-325 MG tablet Take 1 tablet by mouth every 8 (eight) hours as needed for moderate pain.  Marland Kitchen lovastatin (MEVACOR) 10 MG tablet Take 10 mg by mouth at bedtime.  Marland Kitchen lubiprostone (AMITIZA) 24 MCG capsule Take 24 mcg by mouth 2 (two) times daily with a meal.  . metFORMIN (GLUCOPHAGE) 1000 MG tablet Take 1,000 mg by mouth 2 (two) times daily.  . mometasone (NASONEX) 50 MCG/ACT nasal spray Place 1 spray into the nose 2 (two) times daily.  Marland Kitchen MOVANTIK 25 MG TABS tablet Take 25 mg by mouth in the morning.  . nebivolol (BYSTOLIC) 10 MG tablet Take 10 mg by mouth 2 (two) times daily.  . Oxcarbazepine (TRILEPTAL) 300 MG tablet Take 300 mg by mouth 2 (two) times daily.  Marland Kitchen OZEMPIC, 1 MG/DOSE, 4 MG/3ML SOPN INJECT THE CONTENTS OF 1 PEN INJECTOR SUBQ ONCE WEEKLY  . prednisoLONE acetate (PRED FORTE) 1 % ophthalmic suspension  Place 1 drop into both eyes daily.  . pregabalin (LYRICA) 150 MG capsule Take 150 mg by mouth 2 (two) times daily.  Marland Kitchen testosterone cypionate (DEPOTESTOSTERONE CYPIONATE) 200 MG/ML injection Inject 200 mg into the muscle every 21 ( twenty-one) days.     Allergies:   Augmentin [amoxicillin-pot clavulanate], Ivp dye [iodinated diagnostic agents], Ketorolac, Toradol [ketorolac tromethamine], Atorvastatin, Guaifenesin, Penicillins, Dextromethorphan, and Pseudoephedrine   Social History   Socioeconomic History  . Marital status: Divorced    Spouse name: Not on file  . Number of children: Not on file  . Years of education: Not on file  . Highest education level: Not on file  Occupational History  . Not on file  Tobacco Use  . Smoking status: Never Smoker  . Smokeless tobacco: Never Used  Vaping Use  . Vaping Use: Never used  Substance and Sexual Activity  . Alcohol use: No  . Drug use: No  . Sexual activity: Not on file  Other Topics Concern  . Not on file  Social History Narrative  . Not on file   Social Determinants of Health   Financial Resource Strain:   . Difficulty of Paying Living Expenses: Not on file  Food Insecurity:   . Worried About Charity fundraiser in the Last Year: Not on file  . Ran Out of Food in the Last Year: Not on file  Transportation Needs:   . Lack of Transportation (Medical): Not on file  . Lack of Transportation (Non-Medical): Not on file  Physical Activity:   . Days of Exercise per Week: Not on file  . Minutes of Exercise per Session: Not on file  Stress:   . Feeling of Stress : Not on file  Social Connections:   . Frequency of Communication with Friends and Family: Not on file  . Frequency of Social Gatherings with Friends and Family: Not on file  . Attends Religious Services: Not on file  . Active Member of Clubs or Organizations: Not on file  . Attends Archivist Meetings: Not on file  . Marital Status: Not on file     Family  History: The patient's family history includes Macular degeneration in his mother; Thyroid disease in his mother.  ROS:   Please see the history of present illness.    All other systems reviewed and are negative.  EKGs/Labs/Other Studies Reviewed:    The following studies were reviewed today: EKG reveals atrial fibrillation with well-controlled ventricular rate   Recent Labs: 11/22/2019: BUN 20; Creatinine, Ser  1.05; Potassium 4.8; Sodium 139  Recent Lipid Panel No results found for: CHOL, TRIG, HDL, CHOLHDL, VLDL, LDLCALC, LDLDIRECT  Physical Exam:    VS:  BP 140/78   Pulse 76   Ht 6\' 4"  (1.93 m)   Wt (!) 323 lb 12.8 oz (146.9 kg)   SpO2 96%   BMI 39.41 kg/m     Wt Readings from Last 3 Encounters:  04/11/20 (!) 323 lb 12.8 oz (146.9 kg)  12/23/19 (!) 318 lb 3.2 oz (144.3 kg)  11/23/19 (!) 311 lb 12.8 oz (141.4 kg)     GEN: Patient is in no acute distress HEENT: Normal NECK: No JVD; No carotid bruits LYMPHATICS: No lymphadenopathy CARDIAC: Hear sounds regular, 2/6 systolic murmur at the apex. RESPIRATORY:  Clear to auscultation without rales, wheezing or rhonchi  ABDOMEN: Soft, non-tender, non-distended MUSCULOSKELETAL:  No edema; No deformity  SKIN: Warm and dry NEUROLOGIC:  Alert and oriented x 3 PSYCHIATRIC:  Normal affect   Signed, Jenean Lindau, MD  04/11/2020 10:08 AM    [place]

## 2020-04-14 ENCOUNTER — Encounter: Payer: Self-pay | Admitting: Sports Medicine

## 2020-04-14 ENCOUNTER — Other Ambulatory Visit: Payer: Self-pay

## 2020-04-14 ENCOUNTER — Ambulatory Visit: Payer: Medicare HMO | Admitting: Sports Medicine

## 2020-04-14 DIAGNOSIS — E088 Diabetes mellitus due to underlying condition with unspecified complications: Secondary | ICD-10-CM

## 2020-04-14 DIAGNOSIS — G629 Polyneuropathy, unspecified: Secondary | ICD-10-CM

## 2020-04-14 DIAGNOSIS — M19072 Primary osteoarthritis, left ankle and foot: Secondary | ICD-10-CM

## 2020-04-14 DIAGNOSIS — M2142 Flat foot [pes planus] (acquired), left foot: Secondary | ICD-10-CM

## 2020-04-14 DIAGNOSIS — M2042 Other hammer toe(s) (acquired), left foot: Secondary | ICD-10-CM

## 2020-04-14 DIAGNOSIS — M79675 Pain in left toe(s): Secondary | ICD-10-CM

## 2020-04-14 DIAGNOSIS — I739 Peripheral vascular disease, unspecified: Secondary | ICD-10-CM | POA: Diagnosis not present

## 2020-04-14 DIAGNOSIS — G894 Chronic pain syndrome: Secondary | ICD-10-CM

## 2020-04-14 DIAGNOSIS — M79674 Pain in right toe(s): Secondary | ICD-10-CM | POA: Diagnosis not present

## 2020-04-14 DIAGNOSIS — B351 Tinea unguium: Secondary | ICD-10-CM | POA: Diagnosis not present

## 2020-04-14 DIAGNOSIS — M2141 Flat foot [pes planus] (acquired), right foot: Secondary | ICD-10-CM

## 2020-04-14 DIAGNOSIS — M2041 Other hammer toe(s) (acquired), right foot: Secondary | ICD-10-CM

## 2020-04-14 NOTE — Progress Notes (Signed)
Subjective: Peter Buck is a 64 y.o. male patient with history of diabetes who presents to office today complaining of long,mildly painful nails  while ambulating in shoes; unable to trim. Patient reports that his left foot is doing much better physical therapy has helped and states that now that he has a better diagnosis he is doing well.  Patient also is interested in diabetic shoes to offer support and to further help offload flat feet.  Denies any other pedal complaints at this time.  Patient Active Problem List   Diagnosis Date Noted  . Morbid obesity ([place]) 04/11/2020  . Anemia   . Arthritis   . Asthma   . Bipolar disorder ([place])   . Diabetes mellitus without complication ([place])   . GERD (gastroesophageal reflux disease)   . Hypertension   . Sleep apnea   . Paroxysmal atrial fibrillation ([place]) 12/23/2019  . Essential hypertension 11/02/2019  . Diabetes mellitus due to underlying condition with unspecified complications ([place]) 00/93/8182  . Chronic pain of left knee 10/19/2019  . Primary osteoarthritis of both first carpometacarpal joints 08/25/2019  . Second degree burn of finger of right hand 08/25/2019  . Verruca 08/25/2019  . Therapeutic opioid induced constipation 06/22/2019  . Cervical radiculopathy 02/02/2019  . Chronic pain syndrome 02/02/2019  . Facet arthropathy, cervical 02/02/2019  . Foraminal stenosis of cervical region 02/02/2019  . Neuroforaminal stenosis of lumbar spine 12/17/2018  . Pain medication agreement 10/16/2018  . Pain associated with accessory navicular bone of foot, left 05/25/2018  . Aftercare following surgery 04/23/2018  . Muscle spasm of back 02/17/2018  . DDD (degenerative disc disease), lumbar 11/25/2017  . Low back pain 11/25/2017  . Body mass index (BMI) 38.0-38.9, adult 09/16/2017  . Plantar fasciitis 01/02/2017  . Arthritis, midfoot 12/05/2016  . Borderline personality disorder ([place]) 04/29/2016  . 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 Bipolar  affective disorder, depressed ([place]) 02/13/2016  . Comprehensive diabetic foot examination, type 2 DM, encounter for ([place]) 12/25/2015  . Onychomycosis due to dermatophyte 12/25/2015  . Hammertoe 04/26/2014   Current Outpatient Medications on File Prior to Visit  Medication Sig Dispense Refill  . acetaminophen (TYLENOL) 650 MG CR tablet Take 650 mg by mouth every 8 (eight) hours as needed. Or take 650 mg every 5 hours for severe pain    . albuterol (VENTOLIN HFA) 108 (90 Base) MCG/ACT inhaler Inhale 2 puffs into the lungs every 6 (six) hours as needed.    . Calcium Citrate-Vitamin D 200-250 MG-UNIT TABS Take 1 tablet by mouth at bedtime.    . celecoxib (CELEBREX) 200 MG capsule Take 1 capsule by mouth 2 (two) times daily. (Patient not taking: Reported on 04/11/2020)    . chlorpheniramine (CHLOR-TRIMETON) 4 MG tablet Take 4 mg by mouth daily.    . diclofenac Sodium (VOLTAREN) 1 % GEL Apply topically 4 (four) times daily.    Marland Kitchen ELIQUIS 5 MG TABS tablet Take 1 tablet (5 mg total) by mouth 2 (two) times daily. 180 tablet 3  . HYDROcodone-acetaminophen (NORCO/VICODIN) 5-325 MG tablet Take 1 tablet by mouth every 8 (eight) hours as needed for moderate pain.    Marland Kitchen lovastatin (MEVACOR) 10 MG tablet Take 10 mg by mouth at bedtime.    Marland Kitchen lubiprostone (AMITIZA) 24 MCG capsule Take 24 mcg by mouth 2 (two) times daily with a meal.    . metFORMIN (GLUCOPHAGE) 1000 MG tablet Take 1,000 mg by mouth 2 (two) times daily.    . mometasone (NASONEX) 50 MCG/ACT nasal  spray Place 1 spray into the nose 2 (two) times daily.    Marland Kitchen MOVANTIK 25 MG TABS tablet Take 25 mg by mouth in the morning.    . nebivolol (BYSTOLIC) 10 MG tablet Take 10 mg by mouth 2 (two) times daily.    . Oxcarbazepine (TRILEPTAL) 300 MG tablet Take 300 mg by mouth 2 (two) times daily.    Marland Kitchen OZEMPIC, 1 MG/DOSE, 4 MG/3ML SOPN INJECT THE CONTENTS OF 1 PEN INJECTOR SUBQ ONCE WEEKLY    . prednisoLONE acetate (PRED FORTE) 1 % ophthalmic suspension Place 1 drop  into both eyes daily.    . pregabalin (LYRICA) 150 MG capsule Take 150 mg by mouth 2 (two) times daily.    Marland Kitchen testosterone cypionate (DEPOTESTOSTERONE CYPIONATE) 200 MG/ML injection Inject 200 mg into the muscle every 21 ( twenty-one) days.     No current facility-administered medications on file prior to visit.   Allergies  Allergen Reactions  . Augmentin [Amoxicillin-Pot Clavulanate] Anaphylaxis  . Ivp Dye [Iodinated Diagnostic Agents] Anaphylaxis  . Ketorolac Anaphylaxis  . Toradol [Ketorolac Tromethamine] Anaphylaxis    "It put me in shock.  My blood pressure tanked and I was itchy all over; had to go to the ER."  . Atorvastatin Other (See Comments)    Severe back ache Severe back ache Severe back ache Severe back ache  . Guaifenesin Other (See Comments) and Swelling    "It sorta made my throat close up." "It sorta made my throat close up." "It sorta made my throat close up."  . Penicillins Dermatitis  . Dextromethorphan   . Pseudoephedrine Other (See Comments)    "It dries me out.  I take it when my other allergy medicine isn't working."    No results found for this or any previous visit (from the past 2160 hour(s)).  Objective: General: Patient is awake, alert, and oriented x 3 and in no acute distress.  Integument: Skin is warm, dry and supple bilateral. Nails are tender, long, thickened and dystrophic with subungual debris, consistent with onychomycosis, 1-5 bilateral. No signs of infection.  Dry skin bilateral.  No open lesions or preulcerative lesions present bilateral. Remaining integument unremarkable.  Vasculature:  Dorsalis Pedis pulse 1/4 bilateral. Posterior Tibial pulse  0/4 bilateral. Capillary fill time <5 sec 1-5 bilateral.  Scant positive hair growth to the level of the digits.Temperature gradient within normal limits.  Mild varicosities present bilateral.  1+ pitting edema noted bilateral.  Neurology: Protective sensation intact vibratory sensation diminished  bilateral.  No Babinski sign present bilateral.   Musculoskeletal: Asymptomatic pes planus and hammertoe deformity noted bilateral.  There is no reproducible pain to palpation to left midfoot where there is severe fixed pes planus deformity with midtarsal collapse.  There is limited ankle joint range of motion bilateral consistent with equinus deformity.  No tenderness with calf compression bilateral.  Assessment and Plan: Problem List Items Addressed This Visit      Endocrine   Diabetes mellitus due to underlying condition with unspecified complications ([place])     Musculoskeletal and Integument   Hammertoe     Other   Chronic pain syndrome    Other Visit Diagnoses    Pain due to onychomycosis of toenails of both feet    -  Primary   Neuropathy       Pes planus of both feet       Arthritis of midtarsal joint of left foot       PVD (peripheral vascular disease) (  [place])         -Examined patient. -Discussed and educated patient on diabetic foot care, especially with  regards to the vascular, neurological and musculoskeletal systems.  -Stressed the importance of good glycemic control and the detriment of not  controlling glucose levels in relation to the foot. -Mechanically debrided all nails 1-5 bilateral using sterile nail nipper and filed with dremel without incident  -Patient to return to office to see Liliane Channel to be fitted for diabetic shoes and insoles -Continue with physical therapy until completed -Answered all patient questions -Patient to return  in 3 months for at risk foot care -Patient advised to call the office if any problems or questions arise in the meantime.  Landis Martins, DPM

## 2020-04-17 DIAGNOSIS — I152 Hypertension secondary to endocrine disorders: Secondary | ICD-10-CM | POA: Diagnosis not present

## 2020-04-17 DIAGNOSIS — E782 Mixed hyperlipidemia: Secondary | ICD-10-CM | POA: Diagnosis not present

## 2020-04-17 DIAGNOSIS — I482 Chronic atrial fibrillation, unspecified: Secondary | ICD-10-CM | POA: Diagnosis not present

## 2020-04-25 DIAGNOSIS — E291 Testicular hypofunction: Secondary | ICD-10-CM | POA: Diagnosis not present

## 2020-04-26 ENCOUNTER — Ambulatory Visit: Payer: Medicare HMO | Admitting: Cardiology

## 2020-05-01 DIAGNOSIS — Z6839 Body mass index (BMI) 39.0-39.9, adult: Secondary | ICD-10-CM | POA: Diagnosis not present

## 2020-05-01 DIAGNOSIS — R69 Illness, unspecified: Secondary | ICD-10-CM | POA: Diagnosis not present

## 2020-05-01 DIAGNOSIS — R413 Other amnesia: Secondary | ICD-10-CM | POA: Diagnosis not present

## 2020-05-02 DIAGNOSIS — M8580 Other specified disorders of bone density and structure, unspecified site: Secondary | ICD-10-CM | POA: Diagnosis not present

## 2020-05-02 DIAGNOSIS — Z23 Encounter for immunization: Secondary | ICD-10-CM | POA: Diagnosis not present

## 2020-05-02 DIAGNOSIS — Z79899 Other long term (current) drug therapy: Secondary | ICD-10-CM | POA: Diagnosis not present

## 2020-05-02 DIAGNOSIS — M5116 Intervertebral disc disorders with radiculopathy, lumbar region: Secondary | ICD-10-CM | POA: Diagnosis not present

## 2020-05-02 DIAGNOSIS — M5412 Radiculopathy, cervical region: Secondary | ICD-10-CM | POA: Diagnosis not present

## 2020-05-02 DIAGNOSIS — G8929 Other chronic pain: Secondary | ICD-10-CM | POA: Diagnosis not present

## 2020-05-02 DIAGNOSIS — M25531 Pain in right wrist: Secondary | ICD-10-CM | POA: Diagnosis not present

## 2020-05-02 DIAGNOSIS — M19049 Primary osteoarthritis, unspecified hand: Secondary | ICD-10-CM | POA: Diagnosis not present

## 2020-05-02 DIAGNOSIS — K5903 Drug induced constipation: Secondary | ICD-10-CM | POA: Diagnosis not present

## 2020-05-02 DIAGNOSIS — M4802 Spinal stenosis, cervical region: Secondary | ICD-10-CM | POA: Diagnosis not present

## 2020-05-02 DIAGNOSIS — M79672 Pain in left foot: Secondary | ICD-10-CM | POA: Diagnosis not present

## 2020-05-02 DIAGNOSIS — Z79891 Long term (current) use of opiate analgesic: Secondary | ICD-10-CM | POA: Diagnosis not present

## 2020-05-02 DIAGNOSIS — M25561 Pain in right knee: Secondary | ICD-10-CM | POA: Diagnosis not present

## 2020-05-02 DIAGNOSIS — R768 Other specified abnormal immunological findings in serum: Secondary | ICD-10-CM | POA: Diagnosis not present

## 2020-05-02 DIAGNOSIS — M1990 Unspecified osteoarthritis, unspecified site: Secondary | ICD-10-CM | POA: Diagnosis not present

## 2020-05-02 DIAGNOSIS — T402X5D Adverse effect of other opioids, subsequent encounter: Secondary | ICD-10-CM | POA: Diagnosis not present

## 2020-05-08 DIAGNOSIS — M180 Bilateral primary osteoarthritis of first carpometacarpal joints: Secondary | ICD-10-CM | POA: Diagnosis not present

## 2020-05-09 DIAGNOSIS — R0602 Shortness of breath: Secondary | ICD-10-CM | POA: Diagnosis not present

## 2020-05-09 DIAGNOSIS — I480 Paroxysmal atrial fibrillation: Secondary | ICD-10-CM | POA: Diagnosis not present

## 2020-05-09 DIAGNOSIS — R000 Tachycardia, unspecified: Secondary | ICD-10-CM | POA: Diagnosis not present

## 2020-05-09 DIAGNOSIS — R002 Palpitations: Secondary | ICD-10-CM | POA: Diagnosis not present

## 2020-05-10 DIAGNOSIS — E782 Mixed hyperlipidemia: Secondary | ICD-10-CM | POA: Diagnosis not present

## 2020-05-10 DIAGNOSIS — E1159 Type 2 diabetes mellitus with other circulatory complications: Secondary | ICD-10-CM | POA: Diagnosis not present

## 2020-05-16 DIAGNOSIS — E291 Testicular hypofunction: Secondary | ICD-10-CM | POA: Diagnosis not present

## 2020-05-17 DIAGNOSIS — I482 Chronic atrial fibrillation, unspecified: Secondary | ICD-10-CM | POA: Diagnosis not present

## 2020-05-17 DIAGNOSIS — Z6839 Body mass index (BMI) 39.0-39.9, adult: Secondary | ICD-10-CM | POA: Diagnosis not present

## 2020-05-17 DIAGNOSIS — E782 Mixed hyperlipidemia: Secondary | ICD-10-CM | POA: Diagnosis not present

## 2020-05-17 DIAGNOSIS — E1159 Type 2 diabetes mellitus with other circulatory complications: Secondary | ICD-10-CM | POA: Diagnosis not present

## 2020-05-17 DIAGNOSIS — I152 Hypertension secondary to endocrine disorders: Secondary | ICD-10-CM | POA: Diagnosis not present

## 2020-05-17 NOTE — Progress Notes (Signed)
Cardiology Office Note:   Date:  05/19/2020  NAME:  Shail Urbas    MRN: [id number] DOB:  05-15-1956   PCP:  [name], MD  Cardiologist:  No primary care provider on file.   Referring MD: [name], MD   Chief Complaint  Patient presents with  . Follow-up  . Edema    Ankles.    History of Present Illness:   Peter Buck is a 64 y.o. male with a hx of obesity (BMI 39), persistent Afib, OSA, HTN, DM who presents for follow-up. Was seen in [place]. Had normal stress test (breast attenuation). Decision was made for rate control.   He was diagnosed with atrial fibrillation in March 2021.  He underwent a cardioversion in May.  He apparently had recurrence of atrial fibrillation in September.  He was seen by Dr. Geraldo Pitter in [facility] who recommended no further evaluation and to continue with rate control.  He has no symptoms of his atrial fibrillation.  Apparently this was found incidentally.  He does report he can get short of breath with activity.  But does not exercise routinely.  He does do heavy lifting but this is all he does regarding exercise.  There is no structured aerobic activity due to pain in his legs.  He is morbidly obese with a BMI of 39.  Weight is over 300 pounds.  He has not been able to lose weight recently it seems.  He also is diabetic but this is well controlled.  He has hypertension and his blood pressure is 126/80.  He also reports he possibly had a TIA in the past.  His chads vas score is 5 he needs to be on anticoagulation indefinitely.  He denies any chest pain with his current level activity.  No history of heart attack.  He did have a stress test at [facility] which were reported as normal.  His echocardiogram was also reported as normal with normal left atrial size.  He reports he has missed some doses of Eliquis.  He can therefore not undergo cardioversion soon.  He will have to complete 3 weeks of anticoagulation.  We did discuss transesophageal  echocardiogram but he declines this.  LDL cholesterol not at goal for his diabetes.  Most recent value 97.  Has had issue with other statins.  Apparently only the can take his lovastatin.  Never smoker.  Does not drink alcohol.  No drug use reported.  He is retired.  Problem List 1. Persistent Atrial fibrillation -CHADSVASC=5 (age, DM, HTN, TIA) 2. Morbid obesity -BMI 39 3. OSA 4. HTN 5. DM -A1c 6.0 6. HLD -T chol 168, LDL 97, HDL 47, TG 138  Past Medical History: Past Medical History:  Diagnosis Date  . Aftercare following surgery 04/23/2018  . Anemia   . Arthritis   . Arthritis, midfoot 12/05/2016  . Asthma   . Bipolar affective disorder, depressed ([place]) 02/13/2016  . Bipolar disorder ([place])   . Body mass index (bmi) 38.0-38.9, adult 09/16/2017  . Body mass index (BMI) 38.0-38.9, adult 09/16/2017   Formatting of this note might be different from the original. IMO 10/01 Updates  . Borderline personality disorder ([place]) 04/29/2016  . Cervical radiculopathy 02/02/2019   Last Assessment & Plan:  Formatting of this note might be different from the original. MRI of the cervical spine shows cervical spondylosis with facet arthrosis most notable for mild to moderate left neural foraminal stenosis at C3-4. Per patient, nerve conduction studies showed right C7-8 radiculopathy.  Continue with current regimen as outlined in lumbar degenerative disc plan.  . Chronic pain of left knee 10/19/2019  . Chronic pain syndrome 02/02/2019  . Comprehensive diabetic foot examination, type 2 DM, encounter for ([place]) 12/25/2015  . DDD (degenerative disc disease), lumbar 11/25/2017   Last Assessment & Plan:  Formatting of this note might be different from the original. 64 year old male with chronic, predominantly left-sided low back pain with radiation into the left hip and groin.  Today we reviewed his recently updated lumbar MRI which shows impingement of the left L2 nerve root due to broad-based disc bulging  lateralizing to the left and projecting into the far left lateral   . Diabetes mellitus due to underlying condition with unspecified complications ([place]) 12/04/5091  . Diabetes mellitus without complication ([place])   . Essential hypertension 11/02/2019  . Facet arthropathy, cervical 02/02/2019  . Foraminal stenosis of cervical region 02/02/2019  . GERD (gastroesophageal reflux disease)   . Hammertoe 04/26/2014  . Hypertension   . 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 Low back pain 11/25/2017   Last Assessment & Plan:  Coran Dipaola is a 64 y.o. male with Low back pain, and acute on chronic low back pain with left hip, knee, and foot pain.  Today I will treat with the patient's current medication of tramadol and diclofenac and add relafen (temporarily d/c diclofenac) for 10 days and increase lyrica. He will continue with his current appt with his neurosurgery.  Mr. Drab will return to   . Muscle spasm of back 02/17/2018   Last Assessment & Plan:  Formatting of this note might be different from the original. Continue with tizanidine 4 mg as needed for muscle spasming up to 3 times daily.  . Neuroforaminal stenosis of lumbar spine 12/17/2018  . Onychomycosis due to dermatophyte 12/25/2015  . Pain associated with accessory navicular bone of foot, left 05/25/2018  . Pain medication agreement 10/16/2018   Last Assessment & Plan:  Formatting of this note might be different from the original. Review of prior UDS results are within normal limits.  [place] controlled substance registry reviewed and there were no inconsistencies noted.  . Paroxysmal atrial fibrillation ([place]) 12/23/2019  . Plantar fasciitis 01/02/2017  . Primary osteoarthritis of both first carpometacarpal joints 08/25/2019  . Second degree burn of finger of right hand 08/25/2019  . Sleep apnea   . Therapeutic opioid induced constipation 06/22/2019   Last Assessment & Plan:  Formatting of this note might be different from the original. Continue amitiza  52mcg b.i.d.  . Ver[place]ca 08/25/2019    Past Surgical History: Past Surgical History:  Procedure Laterality Date  . APPENDECTOMY    . CHOLECYSTECTOMY    . INNER EAR SURGERY    . KNEE ARTHROSCOPY      Current Medications: Current Meds  Medication Sig  . acetaminophen (TYLENOL) 650 MG CR tablet Take 650 mg by mouth every 8 (eight) hours as needed. Or take 650 mg every 5 hours for severe pain  . albuterol (VENTOLIN HFA) 108 (90 Base) MCG/ACT inhaler Inhale 2 puffs into the lungs every 6 (six) hours as needed.  . Calcium Citrate-Vitamin D 200-250 MG-UNIT TABS Take 1 tablet by mouth at bedtime.  . celecoxib (CELEBREX) 200 MG capsule Take 1 capsule by mouth 2 (two) times daily.   . chlorpheniramine (CHLOR-TRIMETON) 4 MG tablet Take 4 mg by mouth daily.  . diclofenac Sodium (VOLTAREN) 1 % GEL Apply topically 4 (four) times daily.  Marland Kitchen ELIQUIS 5 MG  TABS tablet Take 1 tablet (5 mg total) by mouth 2 (two) times daily.  Marland Kitchen HYDROcodone-acetaminophen (NORCO/VICODIN) 5-325 MG tablet Take 1 tablet by mouth every 8 (eight) hours as needed for moderate pain.  Marland Kitchen lovastatin (MEVACOR) 10 MG tablet Take 10 mg by mouth at bedtime.  Marland Kitchen lubiprostone (AMITIZA) 24 MCG capsule Take 24 mcg by mouth 2 (two) times daily with a meal.  . mometasone (NASONEX) 50 MCG/ACT nasal spray Place 1 spray into the nose 2 (two) times daily.  Marland Kitchen MOVANTIK 25 MG TABS tablet Take 25 mg by mouth in the morning.  . nebivolol (BYSTOLIC) 10 MG tablet Take 10 mg by mouth 2 (two) times daily.  . Oxcarbazepine (TRILEPTAL) 300 MG tablet Take 300 mg by mouth 2 (two) times daily.  Marland Kitchen OZEMPIC, 1 MG/DOSE, 4 MG/3ML SOPN INJECT THE CONTENTS OF 1 PEN INJECTOR SUBQ ONCE WEEKLY  . pregabalin (LYRICA) 150 MG capsule Take 150 mg by mouth 2 (two) times daily.  Marland Kitchen testosterone cypionate (DEPOTESTOSTERONE CYPIONATE) 200 MG/ML injection Inject 200 mg into the muscle every 21 ( twenty-one) days.  . valsartan (DIOVAN) 160 MG tablet Take 160 mg by mouth daily.      Allergies:    Augmentin [amoxicillin-pot clavulanate], Ivp dye [iodinated diagnostic agents], Ketorolac, Toradol [ketorolac tromethamine], Atorvastatin, Guaifenesin, Penicillins, Dextromethorphan, and Pseudoephedrine   Social History: Social History   Socioeconomic History  . Marital status: Divorced    Spouse name: Not on file  . Number of children: Not on file  . Years of education: Not on file  . Highest education level: Not on file  Occupational History  . Occupation: retired  Tobacco Use  . Smoking status: Never Smoker  . Smokeless tobacco: Never Used  Vaping Use  . Vaping Use: Never used  Substance and Sexual Activity  . Alcohol use: No  . Drug use: No  . Sexual activity: Not on file  Other Topics Concern  . Not on file  Social History Narrative  . Not on file   Social Determinants of Health   Financial Resource Strain:   . Difficulty of Paying Living Expenses: Not on file  Food Insecurity:   . Worried About Charity fundraiser in the Last Year: Not on file  . Ran Out of Food in the Last Year: Not on file  Transportation Needs:   . Lack of Transportation (Medical): Not on file  . Lack of Transportation (Non-Medical): Not on file  Physical Activity:   . Days of Exercise per Week: Not on file  . Minutes of Exercise per Session: Not on file  Stress:   . Feeling of Stress : Not on file  Social Connections:   . Frequency of Communication with Friends and Family: Not on file  . Frequency of Social Gatherings with Friends and Family: Not on file  . Attends Religious Services: Not on file  . Active Member of Clubs or Organizations: Not on file  . Attends Archivist Meetings: Not on file  . Marital Status: Not on file     Family History: The patient's family history includes Macular degeneration in his mother; Thyroid disease in his mother.  ROS:   All other ROS reviewed and negative. Pertinent positives noted in the HPI.     EKGs/Labs/Other  Studies Reviewed:   The following studies were personally reviewed by me today:  EKG:  EKG is not ordered today.  His EKG dated 04/11/2020 demonstrates atrial fibrillation with heart rate 79.  NM  Stress 10/25/2019  -anteroseptal infarct without ischemia, EF 53%  Recent Labs: 11/22/2019: BUN 20; Creatinine, Ser 1.05; Potassium 4.8; Sodium 139   Recent Lipid Panel No results found for: CHOL, TRIG, HDL, CHOLHDL, VLDL, LDLCALC, LDLDIRECT  Physical Exam:   VS:  BP 126/80 (BP Location: Left Arm, Patient Position: Sitting, Cuff Size: Large)   Pulse 86   Ht 6\' 4"  (1.93 m)   Wt (!) 327 lb (148.3 kg)   BMI 39.80 kg/m    Wt Readings from Last 3 Encounters:  05/19/20 (!) 327 lb (148.3 kg)  04/11/20 (!) 323 lb 12.8 oz (146.9 kg)  12/23/19 (!) 318 lb 3.2 oz (144.3 kg)    General: Well nourished, well developed, in no acute distress Heart: Atraumatic, normal size  Eyes: PEERLA, EOMI  Neck: Supple, no JVD Endocrine: No thryomegaly Cardiac: Normal S1, S2; irregular rhythm, no murmurs rubs or gallops Lungs: Clear to auscultation bilaterally, no wheezing, rhonchi or rales  Abd: Soft, nontender, no hepatomegaly  Ext: Trace edema Musculoskeletal: No deformities, BUE and BLE strength normal and equal Skin: Warm and dry, no rashes   Neuro: Alert and oriented to person, place, time, and situation, CNII-XII grossly intact, no focal deficits  Psych: Normal mood and affect   ASSESSMENT:   Peter Buck is a 64 y.o. male who presents for the following: 1. Persistent atrial fibrillation ([place])   2. Primary hypertension   3. Obesity (BMI 30-39.9)   4. Mixed hyperlipidemia     PLAN:   1. Persistent atrial fibrillation (HCC) -CHADSVASC=5 (age, DM, HTN, TIA) -I have recommended indefinite anticoagulation with Eliquis.  He agrees to this.  No contraindications to blood thinners at this point.  He is not a candidate for left atrial appendage occlusion. -Failed cardioversion.  Underwent cardioversion in  May 2021.  He has had recurrence.  I discussed with him that he will likely need rhythm control to maintain A. fib.  His weight will likely preclude him from A. fib ablation.  Weight loss is also an effective strategy.  He also needs to have his sleep apnea reevaluated to make sure this is not an issue given his recurrence of atrial fibrillation so quickly. -We have decided on 3 weeks of uninterrupted anticoagulation and then proceed with repeat cardioversion.  I will then see him back in the office 1 week after this and we will start him on flecainide.  He has had a normal cardiac stress test and no increased LV wall thickness or evidence of structural heart disease on his recent echocardiogram.  If he fails this we can then have him evaluated for an ablation.  However, I feel that his weight will preclude him from this.  He needs to lose weight as this is an effective manner to control sinus rhythm.  We will see how it goes over the next few months and likely have him evaluated if he can lose some weight.  2. Primary hypertension -Well-controlled today.  No change in medication.  3. Obesity (BMI 30-39.9) -Referral made to healthy weight and wellness clinic.  4. Mixed hyperlipidemia -Most recent LDL cholesterol 97.  On lovastatin.  Cannot tolerate other statin medications.  We will discuss Zetia at her next visit.  Disposition: Return in about 6 weeks (around 06/30/2020).  Medication Adjustments/Labs and Tests Ordered: Current medicines are reviewed at length with the patient today.  Concerns regarding medicines are outlined above.  Orders Placed This Encounter  Procedures  . Basic Metabolic Panel (BMET)  .  CBC   No orders of the defined types were placed in this encounter.   Patient Instructions  Medication Instructions:   Your physician recommends that you continue on your current medications as directed. Please refer to the Current Medication list given to you today.  1.  Please make  sure you stay on your Eliquis one tablet by mouth (5 mg) twice daily, and please do not miss any doses.  *If you need a refill on your cardiac medications before your next appointment, please call your pharmacy*   Lab Work: Your physician recommends that you have lab work in: bmet/cbc.  If you have labs (blood work) drawn today and your tests are completely normal, you will receive your results only by: Marland Kitchen MyChart Message (if you have MyChart) OR . A paper copy in the mail If you have any lab test that is abnormal or we need to change your treatment, we will call you to review the results.   Testing/Procedures: Your provider has recommended a cardioversion.   You will need COVID-19 testing prior to your procedure. Go to Pre-Procedural COVID-19 Testing Site at [facility] in [place], [place] [address] at _________________________________________________.    You are scheduled for a cardioversion on Friday, January 7 at 7:30 am  with Dr. [name] or associates. Please go to [facility] ([address] 2nd [place] Stay at 6:00 am.  There is free valet parking available.  Enter through the [place] not have any food or drink after midnight on Thursday, January 6.  You may take your medicines with a sip of water on the day of your procedure.    DO NOT STOP YOUR ANTICOAGULANT (BLOOD THINNER) Eliquis- YOU WILL NEED TO CONTINUE YOUR ANTICOAGULANT AFTER YOUR PROCEDURE UNTIL YOU ARE TOLD BY YOUR PROVIDER IT IS SAFE TO STOP  You will need someone to drive you home following your procedure and stay in the waiting room during your procedure. Failure to do so could result in your procedure being cancelled.   Every effort is made to have your procedure done on time. Occasionally there are emergencies that occur at the hospital that may cause delays.   Call the [place] office at [phone] if you have any questions, problems or concerns.      Electrical Cardioversion Electrical cardioversion is the delivery of a jolt of electricity to change the rhythm of the heart. Sticky patches or metal paddles are placed on the chest to deliver the electricity from a device. This is done to restore a normal rhythm. A rhythm that is too fast or not regular keeps the heart from pumping well. Electrical cardioversion is done in an emergency if:   There is low or no blood pressure as a result of the heart rhythm.    Normal rhythm must be restored as fast as possible to protect the brain and heart from further damage.    It may save a life. Cardioversion may be done for heart rhythms that are not immediately life threatening, such as atrial fibrillation or flutter, in which:   The heart is beating too fast or is not regular.    Medicine to change the rhythm has not worked.    It is safe to wait in order to allow time for preparation.  Symptoms of the abnormal rhythm are bothersome.  The risk of stroke and other serious problems can be reduced.  [place]  KNOW ABOUT:   Any allergies you have.  All medicines you are taking, including vitamins, herbs, eye drops, creams, and over-the-counter medicines.  Previous problems you or members of your family have had with the use of anesthetics.    Any blood disorders you have.    Previous surgeries you have had.    Medical conditions you have.   RISKS AND COMPLICATIONS  Generally, this is a safe procedure. However, problems can occur and include:   Breathing problems related to the anesthetic used.  A blood clot that breaks free and travels to other parts of your body. This could cause a stroke or other problems. The risk of this is lowered by use of blood-thinning medicine (anticoagulant) prior to the procedure.  Cardiac arrest (rare).   BEFORE THE PROCEDURE   You may have tests to detect blood clots in your heart and to evaluate heart function.   You may  start taking anticoagulants so your blood does not clot as easily.    Medicines may be given to help stabilize your heart rate and rhythm.   PROCEDURE  You will be given medicine through an IV tube to reduce discomfort and make you sleepy (sedative).    An electrical shock will be delivered.   AFTER THE PROCEDURE Your heart rhythm will be watched to make sure it does not change. You will need someone to drive you home.   Follow-Up: At [facility], you and your health needs are our priority.  As part of our continuing mission to provide you with exceptional heart care, we have created designated Provider Care Teams.  These Care Teams include your primary Cardiologist (physician) and Advanced Practice Providers (APPs -  Physician Assistants and Nurse Practitioners) who all work together to provide you with the care you need, when you need it.  We recommend signing up for the patient portal called "MyChart".  Sign up information is provided on this After Visit Summary.  MyChart is used to connect with patients for Virtual Visits (Telemedicine).  Patients are able to view lab/test results, encounter notes, upcoming appointments, etc.  Non-urgent messages can be sent to your provider as well.   To learn more about what you can do with MyChart, go to [email or web address].    Your next appointment:  Your physician recommends that you keep your scheduled  follow-up appointment with Dr. [name] on January    Other Instructions  Someone will be in touch with you about Health and Wellness.    Time Spent with Patient: I have spent a total of 35 minutes with patient reviewing hospital notes, telemetry, EKGs, labs and examining the patient as well as establishing an assessment and plan that was discussed with the patient.  > 50% of time was spent in direct patient care.  Signed, Addison Naegeli. [name], [place]  [address], [place], [place] [address] [phone]  05/19/2020 12:14 PM

## 2020-05-18 DIAGNOSIS — M180 Bilateral primary osteoarthritis of first carpometacarpal joints: Secondary | ICD-10-CM | POA: Diagnosis not present

## 2020-05-18 DIAGNOSIS — M79645 Pain in left finger(s): Secondary | ICD-10-CM | POA: Diagnosis not present

## 2020-05-18 DIAGNOSIS — Z4889 Encounter for other specified surgical aftercare: Secondary | ICD-10-CM | POA: Diagnosis not present

## 2020-05-18 DIAGNOSIS — M19079 Primary osteoarthritis, unspecified ankle and foot: Secondary | ICD-10-CM | POA: Diagnosis not present

## 2020-05-18 DIAGNOSIS — M79644 Pain in right finger(s): Secondary | ICD-10-CM | POA: Diagnosis not present

## 2020-05-18 DIAGNOSIS — M25642 Stiffness of left hand, not elsewhere classified: Secondary | ICD-10-CM | POA: Diagnosis not present

## 2020-05-18 DIAGNOSIS — M25641 Stiffness of right hand, not elsewhere classified: Secondary | ICD-10-CM | POA: Diagnosis not present

## 2020-05-19 ENCOUNTER — Encounter: Payer: Self-pay | Admitting: Cardiovascular Disease

## 2020-05-19 ENCOUNTER — Ambulatory Visit: Payer: Medicare HMO | Admitting: Cardiovascular Disease

## 2020-05-19 ENCOUNTER — Telehealth: Payer: Self-pay | Admitting: Licensed Clinical Social Worker

## 2020-05-19 ENCOUNTER — Other Ambulatory Visit: Payer: Self-pay

## 2020-05-19 VITALS — BP 126/80 | HR 86 | Ht 76.0 in | Wt 327.0 lb

## 2020-05-19 DIAGNOSIS — I4819 Other persistent atrial fibrillation: Secondary | ICD-10-CM

## 2020-05-19 DIAGNOSIS — E782 Mixed hyperlipidemia: Secondary | ICD-10-CM

## 2020-05-19 DIAGNOSIS — I10 Essential (primary) hypertension: Secondary | ICD-10-CM

## 2020-05-19 DIAGNOSIS — E669 Obesity, unspecified: Secondary | ICD-10-CM

## 2020-05-19 NOTE — Telephone Encounter (Signed)
CSW received referral to assist pt w/ referral to [facility] clinic. Clinic is closed on Friday but I will reach out to pt Monday to assist with referral and any additional needs that may arise.   Peter Buck, MSW, [place]  [phone]

## 2020-05-19 NOTE — Patient Instructions (Addendum)
Medication Instructions:   Your physician recommends that you continue on your current medications as directed. Please refer to the Current Medication list given to you today.  1.  Please make sure you stay on your Eliquis one tablet by mouth (5 mg) twice daily, and please do not miss any doses.  *If you need a refill on your cardiac medications before your next appointment, please call your pharmacy*   Lab Work: Your physician recommends that you have lab work in: bmet/cbc.  If you have labs (blood work) drawn today and your tests are completely normal, you will receive your results only by: Marland Kitchen MyChart Message (if you have MyChart) OR . A paper copy in the mail If you have any lab test that is abnormal or we need to change your treatment, we will call you to review the results.   Testing/Procedures: Your provider has recommended a cardioversion.   You will need COVID-19 testing prior to your procedure. Go to Pre-Procedural COVID-19 Testing Site at [facility] in [place], [place] [address] at _________________________________________________.    You are scheduled for a cardioversion on Friday, January 7 at 7:30 am  with Dr. Audie Buck or associates. Please go to [facility] ([address]) 2nd [place] Stay at 6:00 am.  There is free valet parking available.  Enter through the [place] not have any food or drink after midnight on Thursday, January 6.  You may take your medicines with a sip of water on the day of your procedure.    DO NOT STOP YOUR ANTICOAGULANT (BLOOD THINNER) Eliquis- YOU WILL NEED TO CONTINUE YOUR ANTICOAGULANT AFTER YOUR PROCEDURE UNTIL YOU ARE TOLD BY YOUR PROVIDER IT IS SAFE TO STOP  You will need someone to drive you home following your procedure and stay in the waiting room during your procedure. Failure to do so could result in your procedure being cancelled.   Every effort is made to have your procedure done on time. Occasionally  there are emergencies that occur at the hospital that may cause delays.   Call the [place] office at [phone] if you have any questions, problems or concerns.     Electrical Cardioversion Electrical cardioversion is the delivery of a jolt of electricity to change the rhythm of the heart. Sticky patches or metal paddles are placed on the chest to deliver the electricity from a device. This is done to restore a normal rhythm. A rhythm that is too fast or not regular keeps the heart from pumping well. Electrical cardioversion is done in an emergency if:   There is low or no blood pressure as a result of the heart rhythm.    Normal rhythm must be restored as fast as possible to protect the brain and heart from further damage.    It may save a life. Cardioversion may be done for heart rhythms that are not immediately life threatening, such as atrial fibrillation or flutter, in which:   The heart is beating too fast or is not regular.    Medicine to change the rhythm has not worked.    It is safe to wait in order to allow time for preparation.  Symptoms of the abnormal rhythm are bothersome.  The risk of stroke and other serious problems can be reduced.  LET [facility] CARE PROVIDER KNOW ABOUT:   Any allergies you have.  All medicines you are taking, including vitamins, herbs, eye drops, creams,  and over-the-counter medicines.  Previous problems you or members of your family have had with the use of anesthetics.    Any blood disorders you have.    Previous surgeries you have had.    Medical conditions you have.   RISKS AND COMPLICATIONS  Generally, this is a safe procedure. However, problems can occur and include:   Breathing problems related to the anesthetic used.  A blood clot that breaks free and travels to other parts of your body. This could cause a stroke or other problems. The risk of this is lowered by use of blood-thinning medicine  (anticoagulant) prior to the procedure.  Cardiac arrest (rare).   BEFORE THE PROCEDURE   You may have tests to detect blood clots in your heart and to evaluate heart function.   You may start taking anticoagulants so your blood does not clot as easily.    Medicines may be given to help stabilize your heart rate and rhythm.   PROCEDURE  You will be given medicine through an IV tube to reduce discomfort and make you sleepy (sedative).    An electrical shock will be delivered.   AFTER THE PROCEDURE Your heart rhythm will be watched to make sure it does not change. You will need someone to drive you home.   Follow-Up: At [facility], you and your health needs are our priority.  As part of our continuing mission to provide you with exceptional heart care, we have created designated Provider Care Teams.  These Care Teams include your primary Cardiologist (physician) and Advanced Practice Providers (APPs -  Physician Assistants and Nurse Practitioners) who all work together to provide you with the care you need, when you need it.  We recommend signing up for the patient portal called "MyChart".  Sign up information is provided on this After Visit Summary.  MyChart is used to connect with patients for Virtual Visits (Telemedicine).  Patients are able to view lab/test results, encounter notes, upcoming appointments, etc.  Non-urgent messages can be sent to your provider as well.   To learn more about what you can do with MyChart, go to [email or web address].    Your next appointment:  Your physician recommends that you keep your scheduled  follow-up appointment with Dr. Audie Buck on January    Other Instructions  Someone will be in touch with you about Health and Wellness.

## 2020-05-21 DIAGNOSIS — E119 Type 2 diabetes mellitus without complications: Secondary | ICD-10-CM | POA: Diagnosis not present

## 2020-05-21 DIAGNOSIS — R079 Chest pain, unspecified: Secondary | ICD-10-CM | POA: Diagnosis not present

## 2020-05-21 DIAGNOSIS — R42 Dizziness and giddiness: Secondary | ICD-10-CM | POA: Diagnosis not present

## 2020-05-21 DIAGNOSIS — I4891 Unspecified atrial fibrillation: Secondary | ICD-10-CM | POA: Diagnosis not present

## 2020-05-21 DIAGNOSIS — R0789 Other chest pain: Secondary | ICD-10-CM | POA: Diagnosis not present

## 2020-05-21 DIAGNOSIS — R0902 Hypoxemia: Secondary | ICD-10-CM | POA: Diagnosis not present

## 2020-05-21 DIAGNOSIS — J810 Acute pulmonary edema: Secondary | ICD-10-CM | POA: Diagnosis not present

## 2020-05-21 DIAGNOSIS — R55 Syncope and collapse: Secondary | ICD-10-CM | POA: Diagnosis not present

## 2020-05-21 DIAGNOSIS — I4811 Longstanding persistent atrial fibrillation: Secondary | ICD-10-CM | POA: Diagnosis not present

## 2020-05-21 DIAGNOSIS — R0989 Other specified symptoms and signs involving the circulatory and respiratory systems: Secondary | ICD-10-CM | POA: Diagnosis not present

## 2020-05-21 DIAGNOSIS — I10 Essential (primary) hypertension: Secondary | ICD-10-CM | POA: Diagnosis not present

## 2020-05-21 DIAGNOSIS — E669 Obesity, unspecified: Secondary | ICD-10-CM | POA: Diagnosis not present

## 2020-05-22 ENCOUNTER — Telehealth: Payer: Self-pay | Admitting: Licensed Clinical Social Worker

## 2020-05-22 NOTE — Telephone Encounter (Signed)
Received call back from [facility]. Hope in intake states that pt will need to give her a call [phone]) to review program guidelines and enrollment cost. CSW has left voicemail for pt and when I am able to get in touch will provide him with information to complete referral.   Westley Hummer, MSW, [place]  [phone]

## 2020-05-22 NOTE — Telephone Encounter (Signed)
Left HIPAA compliant message for [facility] referral coordinator Hope at [phone]. CSW also left HIPAA compliant message for pt at [phone]. Will re-attempt as able.   Westley Hummer, MSW, [place]  [phone]

## 2020-05-23 ENCOUNTER — Telehealth: Payer: Self-pay | Admitting: Licensed Clinical Social Worker

## 2020-05-23 NOTE — Telephone Encounter (Signed)
CSW was able to reach pt via telephone today at [phone]. Introduced self, role, reason for call. Pt confirmed home address, coverage, emergency contacts and updated his email to [email or web address].   He is aware that Dr. [name] wanted him to have a referral to [facility]. Pt provided with referral coordinator number at clinic, he will call and if any challenges arise he will let this writer know. He understands they will discuss w/ him program details and that there is an enrollment fee. Pt states no issues w/ affording that fee. Again, my information provided, no additional challenges w/ transportation or finances noted at this time.  Westley Hummer, MSW, [place]  [phone]

## 2020-05-24 ENCOUNTER — Other Ambulatory Visit: Payer: Medicare HMO | Admitting: Orthotics

## 2020-05-26 ENCOUNTER — Telehealth: Payer: Self-pay | Admitting: Licensed Clinical Social Worker

## 2020-05-26 ENCOUNTER — Ambulatory Visit: Payer: Medicare HMO | Admitting: Orthotics

## 2020-05-26 ENCOUNTER — Other Ambulatory Visit: Payer: Self-pay

## 2020-05-26 DIAGNOSIS — B351 Tinea unguium: Secondary | ICD-10-CM

## 2020-05-26 DIAGNOSIS — M2142 Flat foot [pes planus] (acquired), left foot: Secondary | ICD-10-CM

## 2020-05-26 DIAGNOSIS — M79675 Pain in left toe(s): Secondary | ICD-10-CM

## 2020-05-26 DIAGNOSIS — M2041 Other hammer toe(s) (acquired), right foot: Secondary | ICD-10-CM

## 2020-05-26 DIAGNOSIS — M2042 Other hammer toe(s) (acquired), left foot: Secondary | ICD-10-CM

## 2020-05-26 DIAGNOSIS — M2141 Flat foot [pes planus] (acquired), right foot: Secondary | ICD-10-CM

## 2020-05-26 DIAGNOSIS — E088 Diabetes mellitus due to underlying condition with unspecified complications: Secondary | ICD-10-CM

## 2020-05-26 DIAGNOSIS — M79674 Pain in right toe(s): Secondary | ICD-10-CM

## 2020-05-26 DIAGNOSIS — G629 Polyneuropathy, unspecified: Secondary | ICD-10-CM

## 2020-05-26 DIAGNOSIS — I739 Peripheral vascular disease, unspecified: Secondary | ICD-10-CM

## 2020-05-26 NOTE — Progress Notes (Signed)

## 2020-05-26 NOTE — Telephone Encounter (Signed)
CSW received a call from pt- pt had completed referral for [facility] and gotten his first appointment scheduled and now cannot find his appointment time/the number for the office. CSW provided the number again for the referral coordinator [facility] and he will call and confirm the time of his upcoming appointment. No further needs at this time.  Westley Hummer, MSW, [place]  [phone]

## 2020-06-05 DIAGNOSIS — E1159 Type 2 diabetes mellitus with other circulatory complications: Secondary | ICD-10-CM | POA: Diagnosis not present

## 2020-06-06 DIAGNOSIS — E291 Testicular hypofunction: Secondary | ICD-10-CM | POA: Diagnosis not present

## 2020-06-08 DIAGNOSIS — Z6839 Body mass index (BMI) 39.0-39.9, adult: Secondary | ICD-10-CM | POA: Diagnosis not present

## 2020-06-08 DIAGNOSIS — M79675 Pain in left toe(s): Secondary | ICD-10-CM | POA: Diagnosis not present

## 2020-06-13 DIAGNOSIS — I83819 Varicose veins of unspecified lower extremities with pain: Secondary | ICD-10-CM | POA: Diagnosis not present

## 2020-06-13 DIAGNOSIS — Z6841 Body Mass Index (BMI) 40.0 and over, adult: Secondary | ICD-10-CM | POA: Diagnosis not present

## 2020-06-13 DIAGNOSIS — M79672 Pain in left foot: Secondary | ICD-10-CM | POA: Diagnosis not present

## 2020-06-17 DIAGNOSIS — E1159 Type 2 diabetes mellitus with other circulatory complications: Secondary | ICD-10-CM | POA: Diagnosis not present

## 2020-06-17 DIAGNOSIS — I152 Hypertension secondary to endocrine disorders: Secondary | ICD-10-CM | POA: Diagnosis not present

## 2020-06-17 DIAGNOSIS — J029 Acute pharyngitis, unspecified: Secondary | ICD-10-CM | POA: Diagnosis not present

## 2020-06-17 DIAGNOSIS — E782 Mixed hyperlipidemia: Secondary | ICD-10-CM | POA: Diagnosis not present

## 2020-06-17 DIAGNOSIS — I482 Chronic atrial fibrillation, unspecified: Secondary | ICD-10-CM | POA: Diagnosis not present

## 2020-06-19 DIAGNOSIS — M1812 Unilateral primary osteoarthritis of first carpometacarpal joint, left hand: Secondary | ICD-10-CM | POA: Diagnosis not present

## 2020-06-19 DIAGNOSIS — M79645 Pain in left finger(s): Secondary | ICD-10-CM | POA: Diagnosis not present

## 2020-06-19 DIAGNOSIS — M79644 Pain in right finger(s): Secondary | ICD-10-CM | POA: Diagnosis not present

## 2020-06-19 DIAGNOSIS — M1811 Unilateral primary osteoarthritis of first carpometacarpal joint, right hand: Secondary | ICD-10-CM | POA: Diagnosis not present

## 2020-06-19 DIAGNOSIS — M79672 Pain in left foot: Secondary | ICD-10-CM | POA: Diagnosis not present

## 2020-06-21 ENCOUNTER — Other Ambulatory Visit (HOSPITAL_COMMUNITY): Payer: Medicare HMO

## 2020-06-22 ENCOUNTER — Other Ambulatory Visit (HOSPITAL_COMMUNITY)
Admission: RE | Admit: 2020-06-22 | Discharge: 2020-06-22 | Disposition: A | Discharge status: Home / Self Care | Payer: Medicare HMO | Source: Ambulatory Visit | Attending: Cardiovascular Disease | Admitting: Cardiovascular Disease

## 2020-06-22 ENCOUNTER — Other Ambulatory Visit: Payer: Self-pay | Admitting: Cardiology

## 2020-06-22 DIAGNOSIS — Z20822 Contact with and (suspected) exposure to covid-19: Secondary | ICD-10-CM | POA: Insufficient documentation

## 2020-06-22 DIAGNOSIS — Z01812 Encounter for preprocedural laboratory examination: Secondary | ICD-10-CM | POA: Insufficient documentation

## 2020-06-22 LAB — SARS CORONAVIRUS 2 (TAT 6-24 HRS): SARS Coronavirus 2: NEGATIVE

## 2020-06-22 NOTE — Progress Notes (Signed)
Called patient for pre op call. Patient states he may have missed a dose of his eliquis around 3-4 weeks ago but is uncertain the exact date. Notified MD O'Neal and he said unfortunately we will have to cancel the procedure and reschedule. He said the patient has a office visit on 06/29/20 and he will talk to him then. Informed the patient and all questions addressed.

## 2020-06-23 ENCOUNTER — Ambulatory Visit (HOSPITAL_COMMUNITY): Admission: RE | Admit: 2020-06-23 | Payer: Medicare HMO | Source: Home / Self Care | Admitting: Cardiovascular Disease

## 2020-06-23 ENCOUNTER — Encounter (HOSPITAL_COMMUNITY): Admission: RE | Payer: Self-pay | Source: Home / Self Care

## 2020-06-23 SURGERY — CARDIOVERSION
Anesthesia: General

## 2020-06-25 NOTE — H&P (View-Only) (Signed)
Cardiology Office Note:   Date:  06/29/2020  NAME:  Peter Buck    MRN: [id number] DOB:  12/15/1955   PCP:  Peter Shivers, MD  Cardiologist:  No primary care provider on file.   Referring MD: Peter Shivers, MD   Chief Complaint  Patient presents with  . Atrial Fibrillation   History of Present Illness:   Peter Buck is a 65 y.o. male with a hx of obesity, persistent Afib, DM, HTN who presents for follow-up. Seen 05/19/2020 for Afib and had plans for DCCV last week but missed dose of eliquis and DCCV canceled. We had plans to stat him on flecanide.  He reports has not missed any doses of Eliquis.  We can proceed with a repeat cardioversion.  He is lost roughly 10 pounds.  He is working on reducing his calorie consumption.  He seems more motivated.  He has sleep apnea and uses his machine.  He is still in atrial fibrillation today.  We do have plans to proceed with a cardioversion next week.  He will continue to not miss any doses of Eliquis.  He will need lab work today.  His blood pressure is well controlled today.  Most recent lipid profile shows LDL cholesterol not quite at goal.  He is taking lovastatin every other day.  We will proceed with taking this daily.  He has tried several other statins without luck.  He has had myalgias and other issues with him.  We will continue him on this given his diabetes and then plan to possibly add Zetia if not at goal.  No bleeding on Eliquis reported.  Heart rate well controlled.   Problem List 1. Persistent Atrial fibrillation -CHADSVASC=5 (age, DM, HTN, TIA) -failed DCCV 10/2019 -MPI normal 10/2019 (breast attenuation) 2. Morbid obesity -BMI 39 3. OSA 4. HTN 5. DM -A1c 6.0 6. HLD -T chol 168, LDL 97, HDL 47, TG 138  Past Medical History: Past Medical History:  Diagnosis Date  . Aftercare following surgery 04/23/2018  . Anemia   . Arthritis   . Arthritis, midfoot 12/05/2016  . Asthma   . Bipolar affective disorder, depressed  ([place]) 02/13/2016  . Bipolar disorder ([place])   . Body mass index (bmi) 38.0-38.9, adult 09/16/2017  . Body mass index (BMI) 38.0-38.9, adult 09/16/2017   Formatting of this note might be different from the original. IMO 10/01 Updates  . Borderline personality disorder ([place]) 04/29/2016  . Cervical radiculopathy 02/02/2019   Last Assessment & Plan:  Formatting of this note might be different from the original. MRI of the cervical spine shows cervical spondylosis with facet arthrosis most notable for mild to moderate left neural foraminal stenosis at C3-4. Per patient, nerve conduction studies showed right C7-8 radiculopathy.   Continue with current regimen as outlined in lumbar degenerative disc plan.  . Chronic pain of left knee 10/19/2019  . Chronic pain syndrome 02/02/2019  . Comprehensive diabetic foot examination, type 2 DM, encounter for ([place]) 12/25/2015  . DDD (degenerative disc disease), lumbar 11/25/2017   Last Assessment & Plan:  Formatting of this note might be different from the original. 65 year old male with chronic, predominantly left-sided low back pain with radiation into the left hip and groin.  Today we reviewed his recently updated lumbar MRI which shows impingement of the left L2 nerve root due to broad-based disc bulging lateralizing to the left and projecting into the far left lateral   . Diabetes mellitus due to underlying condition with unspecified complications ([place])  11/02/2019  . Diabetes mellitus without complication ([place])   . Essential hypertension 11/02/2019  . Facet arthropathy, cervical 02/02/2019  . Foraminal stenosis of cervical region 02/02/2019  . GERD (gastroesophageal reflux disease)   . Hammertoe 04/26/2014  . Hypertension   . 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 Low back pain 11/25/2017   Last Assessment & Plan:  Peter Buck is a 65 y.o. male with Low back pain, and acute on chronic low back pain with left hip, knee, and foot pain.  Today I will treat with the patient's  current medication of tramadol and diclofenac and add relafen (temporarily d/c diclofenac) for 10 days and increase lyrica. He will continue with his current appt with his neurosurgery.  Peter Buck will return to   . Muscle spasm of back 02/17/2018   Last Assessment & Plan:  Formatting of this note might be different from the original. Continue with tizanidine 4 mg as needed for muscle spasming up to 3 times daily.  . Neuroforaminal stenosis of lumbar spine 12/17/2018  . Onychomycosis due to dermatophyte 12/25/2015  . Pain associated with accessory navicular bone of foot, left 05/25/2018  . Pain medication agreement 10/16/2018   Last Assessment & Plan:  Formatting of this note might be different from the original. Review of prior UDS results are within normal limits.  [place] controlled substance registry reviewed and there were no inconsistencies noted.  . Paroxysmal atrial fibrillation ([place]) 12/23/2019  . Plantar fasciitis 01/02/2017  . Primary osteoarthritis of both first carpometacarpal joints 08/25/2019  . Second degree burn of finger of right hand 08/25/2019  . Sleep apnea   . Therapeutic opioid induced constipation 06/22/2019   Last Assessment & Plan:  Formatting of this note might be different from the original. Continue amitiza 75mcg b.i.d.  . [place] 08/25/2019    Past Surgical History: Past Surgical History:  Procedure Laterality Date  . APPENDECTOMY    . CHOLECYSTECTOMY    . INNER EAR SURGERY    . KNEE ARTHROSCOPY      Current Medications: Current Meds  Medication Sig  . albuterol (VENTOLIN HFA) 108 (90 Base) MCG/ACT inhaler Inhale 2 puffs into the lungs every 6 (six) hours as needed for wheezing or shortness of breath.  . ASHWAGANDHA PO Take 400 mg by mouth daily.  Marland Kitchen BYSTOLIC 10 MG tablet TAKE 1 TABLET BY MOUTH TWICE A DAY  . CALCIUM CITRATE-VITAMIN D PO Take 1 tablet by mouth at bedtime.  . carboxymethylcellulose (REFRESH PLUS) 0.5 % SOLN Place 1 drop into both eyes 3 (three)  times daily as needed (dry eyes).  . celecoxib (CELEBREX) 200 MG capsule Take 200 mg by mouth every other day.  . colchicine 0.6 MG tablet Take 0.6 mg by mouth daily as needed (gout).  Marland Kitchen diclofenac Sodium (VOLTAREN) 1 % GEL Apply 1 application topically daily as needed (pain).  Marland Kitchen ELIQUIS 5 MG TABS tablet Take 1 tablet (5 mg total) by mouth 2 (two) times daily.  . Horse Chestnut 300 MG CAPS Take 1 capsule by mouth in the morning and at bedtime.  Marland Kitchen HYDROcodone-acetaminophen (NORCO/VICODIN) 5-325 MG tablet Take 1 tablet by mouth every 8 (eight) hours as needed for moderate pain.  Marland Kitchen lovastatin (MEVACOR) 10 MG tablet Take 10 mg by mouth every Monday, Wednesday, and Friday.  . lubiprostone (AMITIZA) 24 MCG capsule Take 24 mcg by mouth 2 (two) times daily with a meal.  . mometasone (NASONEX) 50 MCG/ACT nasal spray Place 1 spray into the nose at  bedtime.  Marland Kitchen MOVANTIK 25 MG TABS tablet Take 25 mg by mouth in the morning.  Marland Kitchen OVER THE COUNTER MEDICATION Take 1 capsule by mouth in the morning and at bedtime. Horse Chestnut Extract  . OVER THE COUNTER MEDICATION Apply 1 application topically daily as needed (itchy scalp). Scalpisin  . Oxcarbazepine (TRILEPTAL) 300 MG tablet Take 300 mg by mouth 2 (two) times daily.  Marland Kitchen OZEMPIC, 1 MG/DOSE, 4 MG/3ML SOPN Inject 1 mg into the muscle every Saturday. INJECT THE CONTENTS OF 1 PEN INJECTOR SUBQ ONCE WEEKLY  . pregabalin (LYRICA) 200 MG capsule Take 200 mg by mouth 2 (two) times daily.  Marland Kitchen testosterone cypionate (DEPOTESTOSTERONE CYPIONATE) 200 MG/ML injection Inject 200 mg into the muscle every 21 ( twenty-one) days.  . valsartan (DIOVAN) 160 MG tablet Take 160 mg by mouth at bedtime.     Allergies:    Augmentin [amoxicillin-pot clavulanate], Ivp dye [iodinated diagnostic agents], Ketorolac, Toradol [ketorolac tromethamine], Atorvastatin, Guaifenesin, Penicillins, Alendronate sodium, Dextromethorphan, Other, and Pseudoephedrine   Social History: Social History    Socioeconomic History  . Marital status: Divorced    Spouse name: Not on file  . Number of children: Not on file  . Years of education: Not on file  . Highest education level: Not on file  Occupational History  . Occupation: retired  Tobacco Use  . Smoking status: Never Smoker  . Smokeless tobacco: Never Used  Vaping Use  . Vaping Use: Never used  Substance and Sexual Activity  . Alcohol use: No  . Drug use: No  . Sexual activity: Not on file  Other Topics Concern  . Not on file  Social History Narrative  . Not on file   Social Determinants of Health   Financial Resource Strain: Not on file  Food Insecurity: Not on file  Transportation Needs: Not on file  Physical Activity: Not on file  Stress: Not on file  Social Connections: Not on file     Family History: The patient's family history includes Macular degeneration in his mother; Thyroid disease in his mother.  ROS:   All other ROS reviewed and negative. Pertinent positives noted in the HPI.     EKGs/Labs/Other Studies Reviewed:   The following studies were personally reviewed by me today:  NM Stress 10/25/2019  -anteroseptal infarct without ischemia, EF 53%  Echo 10/23/2019 EF 55-60%  Recent Labs: 11/22/2019: BUN 20; Creatinine, Ser 1.05; Potassium 4.8; Sodium 139   Recent Lipid Panel No results found for: CHOL, TRIG, HDL, CHOLHDL, VLDL, LDLCALC, LDLDIRECT  Physical Exam:   VS:  BP 104/70   Pulse 89   Ht 6\' 5"  (1.956 m)   Wt (!) 317 lb (143.8 kg)   SpO2 94%   BMI 37.59 kg/m    Wt Readings from Last 3 Encounters:  06/29/20 (!) 317 lb (143.8 kg)  05/19/20 (!) 327 lb (148.3 kg)  04/11/20 (!) 323 lb 12.8 oz (146.9 kg)    General: Well nourished, well developed, in no acute distress Head: Atraumatic, normal size  Eyes: PEERLA, EOMI  Neck: Supple, no JVD Endocrine: No thryomegaly Cardiac: Normal S1, S2; irregular rhythm, no murmurs rubs or gallops Lungs: Clear to auscultation bilaterally, no  wheezing, rhonchi or rales  Abd: Soft, nontender, no hepatomegaly  Ext: No edema, pulses 2+ Musculoskeletal: No deformities, BUE and BLE strength normal and equal Skin: Warm and dry, no rashes   Neuro: Alert and oriented to person, place, time, and situation, CNII-XII grossly intact, no focal deficits  Psych: Normal mood and affect   ASSESSMENT:   Gearld Kerstein is a 66 y.o. male who presents for the following: 1. Persistent atrial fibrillation ([place])   2. Adequate anticoagulation on anticoagulant therapy   3. Primary hypertension   4. Obesity (BMI 30-39.9)   5. Mixed hyperlipidemia     PLAN:   1. Persistent atrial fibrillation ([place]) 2. Adequate anticoagulation on anticoagulant therapy -CHADSVASC=5 (age, DM, HTN, TIA) -failed DCCV 10/2019 -MPI normal 10/2019 (breast attenuation) -We have plans for cardioversion several weeks ago.  He missed a dose of Eliquis.  He has not missed any dosing last 3 weeks.  We will set him up for a repeat cardioversion next week with me.  He understands the importance of not missing any doses of his Eliquis. -He has a structurally normal heart.  Stress test without ischemia.  I think it would be okay to proceed with using flecainide or Dronedarone. I will discuss this with him in the hospital. -Continue Bystolic for rate control for now. -Weight loss will be an effective means for him to stay in normal rhythm.  Also needs to use his CPAP machine.  3. Primary hypertension -BP well controlled. -Continue 20 lose weight as well as use his CPAP machine.  4. Obesity (BMI 30-39.9) -Weight loss encouraged.  He is lost 10 pounds.  5. Mixed hyperlipidemia -He will take lovastatin daily.  He has had issues with other statins in the past.  We cannot get his LDL less than 70 we will add Zetia.   Shared Decision Making/Informed Consent The risks (stroke, cardiac arrhythmias rarely resulting in the need for a temporary or permanent pacemaker, skin irritation or burns  and complications associated with conscious sedation including aspiration, arrhythmia, respiratory failure and death), benefits (restoration of normal sinus rhythm) and alternatives of a direct current cardioversion were explained in detail to Mr. Halloran and he agrees to proceed.   Disposition: Return in about 1 month (around 07/30/2020).  Medication Adjustments/Labs and Tests Ordered: Current medicines are reviewed at length with the patient today.  Concerns regarding medicines are outlined above.  Orders Placed This Encounter  Procedures  . CBC  . Basic metabolic panel   No orders of the defined types were placed in this encounter.   Patient Instructions  Medication Instructions:  The current medical regimen is effective;  continue present plan and medications.  *If you need a refill on your cardiac medications before your next appointment, please call your pharmacy*   Lab Work: BME CBC today COVID TESTING: Monday, January 17th @ 11:40- [place] [address], [place]  If you have labs (blood work) drawn today and your tests are completely normal, you will receive your results only by: Marland Kitchen MyChart Message (if you have MyChart) OR . A paper copy in the mail If you have any lab test that is abnormal or we need to change your treatment, we will call you to review the results.   Testing/Procedures: Your physician has requested that you have a Cardioversion. Electrical Cardioversion uses a jolt of electricity to your heart either through paddles or wired patches attached to your chest. This is a controlled, usually prescheduled, procedure. This procedure is done at the hospital and you are not awake during the procedure. You usually go home the day of the procedure. Please see the instruction sheet given to you today for more information.   Follow-Up: At [facility], you and your health needs are our priority.  As part of our  continuing mission to provide you with exceptional heart  care, we have created designated Provider Care Teams.  These Care Teams include your primary Cardiologist (physician) and Advanced Practice Providers (APPs -  Physician Assistants and Nurse Practitioners) who all work together to provide you with the care you need, when you need it.  We recommend signing up for the patient portal called "MyChart".  Sign up information is provided on this After Visit Summary.  MyChart is used to connect with patients for Virtual Visits (Telemedicine).  Patients are able to view lab/test results, encounter notes, upcoming appointments, etc.  Non-urgent messages can be sent to your provider as well.   To learn more about what you can do with MyChart, go to [email or web address].    Your next appointment:   4 week(s)  The format for your next appointment:   In Person  Provider:   Eleonore Chiquito, MD   Other Instructions  You are scheduled for a Cardioversion on 01/19 with Dr. [name].  Please arrive at the [facility] (Main Entrance A) at [facility]: [place], [place] [address] at 8 am/pm. (1 hour prior to procedure unless lab work is needed; if lab work is needed arrive 1.5 hours ahead)  DIET: Nothing to eat or drink after midnight except a sip of water with medications (see medication instructions below)  Medication Instructions:  Continue your anticoagulant: Eliquis You will need to continue your anticoagulant after your procedure until you  are told by your  Provider that it is safe to stop   Labs: CBC, BMET today  You must have a responsible person to drive you home and stay in the waiting area during your procedure. Failure to do so could result in cancellation.  Bring your insurance cards.  *Special Note: Every effort is made to have your procedure done on time. Occasionally there are emergencies that occur at the hospital that may cause delays. Please be patient if a delay does occur.       Time Spent with Patient: I  have spent a total of 25 minutes with patient reviewing hospital notes, telemetry, EKGs, labs and examining the patient as well as establishing an assessment and plan that was discussed with the patient.  > 50% of time was spent in direct patient care.  Signed, Addison Naegeli. [name], [place]  [address], [place], [place] [address] [phone]  06/29/2020 9:59 AM

## 2020-06-25 NOTE — Progress Notes (Addendum)
Cardiology Office Note:   Date:  06/29/2020  NAME:  Peter Buck    MRN: [id number] DOB:  12/15/1955   PCP:  [name], MD  Cardiologist:  No primary care provider on file.   Referring MD: [name], MD   Chief Complaint  Patient presents with  . Atrial Fibrillation   History of Present Illness:   Peter Buck is a 65 y.o. male with a hx of obesity, persistent Afib, DM, HTN who presents for follow-up. Seen 05/19/2020 for Afib and had plans for DCCV last week but missed dose of eliquis and DCCV canceled. We had plans to stat him on flecanide.  He reports has not missed any doses of Eliquis.  We can proceed with a repeat cardioversion.  He is lost roughly 10 pounds.  He is working on reducing his calorie consumption.  He seems more motivated.  He has sleep apnea and uses his machine.  He is still in atrial fibrillation today.  We do have plans to proceed with a cardioversion next week.  He will continue to not miss any doses of Eliquis.  He will need lab work today.  His blood pressure is well controlled today.  Most recent lipid profile shows LDL cholesterol not quite at goal.  He is taking lovastatin every other day.  We will proceed with taking this daily.  He has tried several other statins without luck.  He has had myalgias and other issues with him.  We will continue him on this given his diabetes and then plan to possibly add Zetia if not at goal.  No bleeding on Eliquis reported.  Heart rate well controlled.   Problem List 1. Persistent Atrial fibrillation -CHADSVASC=5 (age, DM, HTN, TIA) -failed DCCV 10/2019 -MPI normal 10/2019 (breast attenuation) 2. Morbid obesity -BMI 39 3. OSA 4. HTN 5. DM -A1c 6.0 6. HLD -T chol 168, LDL 97, HDL 47, TG 138  Past Medical History: Past Medical History:  Diagnosis Date  . Aftercare following surgery 04/23/2018  . Anemia   . Arthritis   . Arthritis, midfoot 12/05/2016  . Asthma   . Bipolar affective disorder, depressed  ([place]) 02/13/2016  . Bipolar disorder ([place])   . Body mass index (bmi) 38.0-38.9, adult 09/16/2017  . Body mass index (BMI) 38.0-38.9, adult 09/16/2017   Formatting of this note might be different from the original. IMO 10/01 Updates  . Borderline personality disorder ([place]) 04/29/2016  . Cervical radiculopathy 02/02/2019   Last Assessment & Plan:  Formatting of this note might be different from the original. MRI of the cervical spine shows cervical spondylosis with facet arthrosis most notable for mild to moderate left neural foraminal stenosis at C3-4. Per patient, nerve conduction studies showed right C7-8 radiculopathy.   Continue with current regimen as outlined in lumbar degenerative disc plan.  . Chronic pain of left knee 10/19/2019  . Chronic pain syndrome 02/02/2019  . Comprehensive diabetic foot examination, type 2 DM, encounter for ([place]) 12/25/2015  . DDD (degenerative disc disease), lumbar 11/25/2017   Last Assessment & Plan:  Formatting of this note might be different from the original. 65 year old male with chronic, predominantly left-sided low back pain with radiation into the left hip and groin.  Today we reviewed his recently updated lumbar MRI which shows impingement of the left L2 nerve root due to broad-based disc bulging lateralizing to the left and projecting into the far left lateral   . Diabetes mellitus due to underlying condition with unspecified complications ([place])  11/02/2019  . Diabetes mellitus without complication ([place])   . Essential hypertension 11/02/2019  . Facet arthropathy, cervical 02/02/2019  . Foraminal stenosis of cervical region 02/02/2019  . GERD (gastroesophageal reflux disease)   . Hammertoe 04/26/2014  . Hypertension   . 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 Low back pain 11/25/2017   Last Assessment & Plan:  Peter Buck is a 65 y.o. male with Low back pain, and acute on chronic low back pain with left hip, knee, and foot pain.  Today I will treat with the patient's  current medication of tramadol and diclofenac and add relafen (temporarily d/c diclofenac) for 10 days and increase lyrica. He will continue with his current appt with his neurosurgery.  Mr. Deo will return to   . Muscle spasm of back 02/17/2018   Last Assessment & Plan:  Formatting of this note might be different from the original. Continue with tizanidine 4 mg as needed for muscle spasming up to 3 times daily.  . Neuroforaminal stenosis of lumbar spine 12/17/2018  . Onychomycosis due to dermatophyte 12/25/2015  . Pain associated with accessory navicular bone of foot, left 05/25/2018  . Pain medication agreement 10/16/2018   Last Assessment & Plan:  Formatting of this note might be different from the original. Review of prior UDS results are within normal limits.  [place] controlled substance registry reviewed and there were no inconsistencies noted.  . Paroxysmal atrial fibrillation ([place]) 12/23/2019  . Plantar fasciitis 01/02/2017  . Primary osteoarthritis of both first carpometacarpal joints 08/25/2019  . Second degree burn of finger of right hand 08/25/2019  . Sleep apnea   . Therapeutic opioid induced constipation 06/22/2019   Last Assessment & Plan:  Formatting of this note might be different from the original. Continue amitiza 39mcg b.i.d.  . [place]ca 08/25/2019    Past Surgical History: Past Surgical History:  Procedure Laterality Date  . APPENDECTOMY    . CHOLECYSTECTOMY    . INNER EAR SURGERY    . KNEE ARTHROSCOPY      Current Medications: Current Meds  Medication Sig  . albuterol (VENTOLIN HFA) 108 (90 Base) MCG/ACT inhaler Inhale 2 puffs into the lungs every 6 (six) hours as needed for wheezing or shortness of breath.  . ASHWAGANDHA PO Take 400 mg by mouth daily.  Marland Kitchen BYSTOLIC 10 MG tablet TAKE 1 TABLET BY MOUTH TWICE A DAY  . CALCIUM CITRATE-VITAMIN D PO Take 1 tablet by mouth at bedtime.  . carboxymethylcellulose (REFRESH PLUS) 0.5 % SOLN Place 1 drop into both eyes 3 (three)  times daily as needed (dry eyes).  . celecoxib (CELEBREX) 200 MG capsule Take 200 mg by mouth every other day.  . colchicine 0.6 MG tablet Take 0.6 mg by mouth daily as needed (gout).  Marland Kitchen diclofenac Sodium (VOLTAREN) 1 % GEL Apply 1 application topically daily as needed (pain).  Marland Kitchen ELIQUIS 5 MG TABS tablet Take 1 tablet (5 mg total) by mouth 2 (two) times daily.  . Horse Chestnut 300 MG CAPS Take 1 capsule by mouth in the morning and at bedtime.  Marland Kitchen HYDROcodone-acetaminophen (NORCO/VICODIN) 5-325 MG tablet Take 1 tablet by mouth every 8 (eight) hours as needed for moderate pain.  Marland Kitchen lovastatin (MEVACOR) 10 MG tablet Take 10 mg by mouth every Monday, Wednesday, and Friday.  . lubiprostone (AMITIZA) 24 MCG capsule Take 24 mcg by mouth 2 (two) times daily with a meal.  . mometasone (NASONEX) 50 MCG/ACT nasal spray Place 1 spray into the nose at  bedtime.  Marland Kitchen MOVANTIK 25 MG TABS tablet Take 25 mg by mouth in the morning.  Marland Kitchen OVER THE COUNTER MEDICATION Take 1 capsule by mouth in the morning and at bedtime. Horse Chestnut Extract  . OVER THE COUNTER MEDICATION Apply 1 application topically daily as needed (itchy scalp). Scalpisin  . Oxcarbazepine (TRILEPTAL) 300 MG tablet Take 300 mg by mouth 2 (two) times daily.  Marland Kitchen OZEMPIC, 1 MG/DOSE, 4 MG/3ML SOPN Inject 1 mg into the muscle every Saturday. INJECT THE CONTENTS OF 1 PEN INJECTOR SUBQ ONCE WEEKLY  . pregabalin (LYRICA) 200 MG capsule Take 200 mg by mouth 2 (two) times daily.  Marland Kitchen testosterone cypionate (DEPOTESTOSTERONE CYPIONATE) 200 MG/ML injection Inject 200 mg into the muscle every 21 ( twenty-one) days.  . valsartan (DIOVAN) 160 MG tablet Take 160 mg by mouth at bedtime.     Allergies:    Augmentin [amoxicillin-pot clavulanate], Ivp dye [iodinated diagnostic agents], Ketorolac, Toradol [ketorolac tromethamine], Atorvastatin, Guaifenesin, Penicillins, Alendronate sodium, Dextromethorphan, Other, and Pseudoephedrine   Social History: Social History    Socioeconomic History  . Marital status: Divorced    Spouse name: Not on file  . Number of children: Not on file  . Years of education: Not on file  . Highest education level: Not on file  Occupational History  . Occupation: retired  Tobacco Use  . Smoking status: Never Smoker  . Smokeless tobacco: Never Used  Vaping Use  . Vaping Use: Never used  Substance and Sexual Activity  . Alcohol use: No  . Drug use: No  . Sexual activity: Not on file  Other Topics Concern  . Not on file  Social History Narrative  . Not on file   Social Determinants of Health   Financial Resource Strain: Not on file  Food Insecurity: Not on file  Transportation Needs: Not on file  Physical Activity: Not on file  Stress: Not on file  Social Connections: Not on file     Family History: The patient's family history includes Macular degeneration in his mother; Thyroid disease in his mother.  ROS:   All other ROS reviewed and negative. Pertinent positives noted in the HPI.     EKGs/Labs/Other Studies Reviewed:   The following studies were personally reviewed by me today:  NM Stress 10/25/2019  -anteroseptal infarct without ischemia, EF 53%  Echo 10/23/2019 EF 55-60%  Recent Labs: 11/22/2019: BUN 20; Creatinine, Ser 1.05; Potassium 4.8; Sodium 139   Recent Lipid Panel No results found for: CHOL, TRIG, HDL, CHOLHDL, VLDL, LDLCALC, LDLDIRECT  Physical Exam:   VS:  BP 104/70   Pulse 89   Ht 6\' 5"  (1.956 m)   Wt (!) 317 lb (143.8 kg)   SpO2 94%   BMI 37.59 kg/m    Wt Readings from Last 3 Encounters:  06/29/20 (!) 317 lb (143.8 kg)  05/19/20 (!) 327 lb (148.3 kg)  04/11/20 (!) 323 lb 12.8 oz (146.9 kg)    General: Well nourished, well developed, in no acute distress Head: Atraumatic, normal size  Eyes: PEERLA, EOMI  Neck: Supple, no JVD Endocrine: No thryomegaly Cardiac: Normal S1, S2; irregular rhythm, no murmurs rubs or gallops Lungs: Clear to auscultation bilaterally, no  wheezing, rhonchi or rales  Abd: Soft, nontender, no hepatomegaly  Ext: No edema, pulses 2+ Musculoskeletal: No deformities, BUE and BLE strength normal and equal Skin: Warm and dry, no rashes   Neuro: Alert and oriented to person, place, time, and situation, CNII-XII grossly intact, no focal deficits  Psych: Normal mood and affect   ASSESSMENT:   Peter Buck is a 66 y.o. male who presents for the following: 1. Persistent atrial fibrillation ([place])   2. Adequate anticoagulation on anticoagulant therapy   3. Primary hypertension   4. Obesity (BMI 30-39.9)   5. Mixed hyperlipidemia     PLAN:   1. Persistent atrial fibrillation ([place]) 2. Adequate anticoagulation on anticoagulant therapy -CHADSVASC=5 (age, DM, HTN, TIA) -failed DCCV 10/2019 -MPI normal 10/2019 (breast attenuation) -We have plans for cardioversion several weeks ago.  He missed a dose of Eliquis.  He has not missed any dosing last 3 weeks.  We will set him up for a repeat cardioversion next week with me.  He understands the importance of not missing any doses of his Eliquis. -He has a structurally normal heart.  Stress test without ischemia.  I think it would be okay to proceed with using flecainide or Dronedarone. I will discuss this with him in the hospital. -Continue Bystolic for rate control for now. -Weight loss will be an effective means for him to stay in normal rhythm.  Also needs to use his CPAP machine.  3. Primary hypertension -BP well controlled. -Continue 20 lose weight as well as use his CPAP machine.  4. Obesity (BMI 30-39.9) -Weight loss encouraged.  He is lost 10 pounds.  5. Mixed hyperlipidemia -He will take lovastatin daily.  He has had issues with other statins in the past.  We cannot get his LDL less than 70 we will add Zetia.   Shared Decision Making/Informed Consent The risks (stroke, cardiac arrhythmias rarely resulting in the need for a temporary or permanent pacemaker, skin irritation or burns  and complications associated with conscious sedation including aspiration, arrhythmia, respiratory failure and death), benefits (restoration of normal sinus rhythm) and alternatives of a direct current cardioversion were explained in detail to Peter Buck and he agrees to proceed.   Disposition: Return in about 1 month (around 07/30/2020).  Medication Adjustments/Labs and Tests Ordered: Current medicines are reviewed at length with the patient today.  Concerns regarding medicines are outlined above.  Orders Placed This Encounter  Procedures  . CBC  . Basic metabolic panel   No orders of the defined types were placed in this encounter.   Patient Instructions  Medication Instructions:  The current medical regimen is effective;  continue present plan and medications.  *If you need a refill on your cardiac medications before your next appointment, please call your pharmacy*   Lab Work: BME CBC today COVID TESTING: Monday, January 17th @ 11:40- [place] [address], [place]  If you have labs (blood work) drawn today and your tests are completely normal, you will receive your results only by: Marland Kitchen MyChart Message (if you have MyChart) OR . A paper copy in the mail If you have any lab test that is abnormal or we need to change your treatment, we will call you to review the results.   Testing/Procedures: Your physician has requested that you have a Cardioversion. Electrical Cardioversion uses a jolt of electricity to your heart either through paddles or wired patches attached to your chest. This is a controlled, usually prescheduled, procedure. This procedure is done at the hospital and you are not awake during the procedure. You usually go home the day of the procedure. Please see the instruction sheet given to you today for more information.   Follow-Up: At [facility], you and your health needs are our priority.  As part of our  continuing mission to provide you with exceptional heart  care, we have created designated Provider Care Teams.  These Care Teams include your primary Cardiologist (physician) and Advanced Practice Providers (APPs -  Physician Assistants and Nurse Practitioners) who all work together to provide you with the care you need, when you need it.  We recommend signing up for the patient portal called "MyChart".  Sign up information is provided on this After Visit Summary.  MyChart is used to connect with patients for Virtual Visits (Telemedicine).  Patients are able to view lab/test results, encounter notes, upcoming appointments, etc.  Non-urgent messages can be sent to your provider as well.   To learn more about what you can do with MyChart, go to [email or web address].    Your next appointment:   4 week(s)  The format for your next appointment:   In Person  Provider:   Eleonore Chiquito, MD   Other Instructions  You are scheduled for a Cardioversion on 01/19 with Dr. [name].  Please arrive at the [facility] (Main Entrance A) at [facility]: [place], [place] [address] at 8 am/pm. (1 hour prior to procedure unless lab work is needed; if lab work is needed arrive 1.5 hours ahead)  DIET: Nothing to eat or drink after midnight except a sip of water with medications (see medication instructions below)  Medication Instructions:  Continue your anticoagulant: Eliquis You will need to continue your anticoagulant after your procedure until you  are told by your  Provider that it is safe to stop   Labs: CBC, BMET today  You must have a responsible person to drive you home and stay in the waiting area during your procedure. Failure to do so could result in cancellation.  Bring your insurance cards.  *Special Note: Every effort is made to have your procedure done on time. Occasionally there are emergencies that occur at the hospital that may cause delays. Please be patient if a delay does occur.       Time Spent with Patient: I  have spent a total of 25 minutes with patient reviewing hospital notes, telemetry, EKGs, labs and examining the patient as well as establishing an assessment and plan that was discussed with the patient.  > 50% of time was spent in direct patient care.  Signed, Addison Naegeli. [name], [place]  [address] [place], [place] [address] [phone]  06/29/2020 9:59 AM

## 2020-06-26 ENCOUNTER — Ambulatory Visit: Payer: Medicare HMO | Admitting: Cardiovascular Disease

## 2020-06-26 ENCOUNTER — Telehealth: Payer: Self-pay

## 2020-06-26 NOTE — Telephone Encounter (Signed)
Notice of approval of Medicare Prescription Drug Coverage received from [employer] / [employer] for patient. Member ID #[id number] Nebivolol HCL 10 mg table. Authorizes quantity up to 180 every 90 days. Coverage from 06/17/2020 - 06/16/2021

## 2020-06-27 DIAGNOSIS — E291 Testicular hypofunction: Secondary | ICD-10-CM | POA: Diagnosis not present

## 2020-06-29 ENCOUNTER — Other Ambulatory Visit: Payer: Self-pay

## 2020-06-29 ENCOUNTER — Encounter: Payer: Self-pay | Admitting: Cardiovascular Disease

## 2020-06-29 ENCOUNTER — Ambulatory Visit: Payer: Medicare HMO | Admitting: Cardiovascular Disease

## 2020-06-29 VITALS — BP 104/70 | HR 89 | Ht 77.0 in | Wt 317.0 lb

## 2020-06-29 DIAGNOSIS — E782 Mixed hyperlipidemia: Secondary | ICD-10-CM

## 2020-06-29 DIAGNOSIS — I10 Essential (primary) hypertension: Secondary | ICD-10-CM | POA: Diagnosis not present

## 2020-06-29 DIAGNOSIS — E669 Obesity, unspecified: Secondary | ICD-10-CM | POA: Diagnosis not present

## 2020-06-29 DIAGNOSIS — I4819 Other persistent atrial fibrillation: Secondary | ICD-10-CM | POA: Diagnosis not present

## 2020-06-29 DIAGNOSIS — Z7901 Long term (current) use of anticoagulants: Secondary | ICD-10-CM | POA: Diagnosis not present

## 2020-06-29 NOTE — Patient Instructions (Addendum)
Medication Instructions:  The current medical regimen is effective;  continue present plan and medications.  *If you need a refill on your cardiac medications before your next appointment, please call your pharmacy*   Lab Work: BME CBC today COVID TESTING: Monday, January 17th @ 11:40- [place] [address], [place]  If you have labs (blood work) drawn today and your tests are completely normal, you will receive your results only by: Marland Kitchen MyChart Message (if you have MyChart) OR . A paper copy in the mail If you have any lab test that is abnormal or we need to change your treatment, we will call you to review the results.   Testing/Procedures: Your physician has requested that you have a Cardioversion. Electrical Cardioversion uses a jolt of electricity to your heart either through paddles or wired patches attached to your chest. This is a controlled, usually prescheduled, procedure. This procedure is done at the hospital and you are not awake during the procedure. You usually go home the day of the procedure. Please see the instruction sheet given to you today for more information.   Follow-Up: At [facility], you and your health needs are our priority.  As part of our continuing mission to provide you with exceptional heart care, we have created designated Provider Care Teams.  These Care Teams include your primary Cardiologist (physician) and Advanced Practice Providers (APPs -  Physician Assistants and Nurse Practitioners) who all work together to provide you with the care you need, when you need it.  We recommend signing up for the patient portal called "MyChart".  Sign up information is provided on this After Visit Summary.  MyChart is used to connect with patients for Virtual Visits (Telemedicine).  Patients are able to view lab/test results, encounter notes, upcoming appointments, etc.  Non-urgent messages can be sent to your provider as well.   To learn more about what you can do  with MyChart, go to [email or web address].    Your next appointment:   4 week(s)  The format for your next appointment:   In Person  Provider:   Eleonore Chiquito, MD   Other Instructions  You are scheduled for a Cardioversion on 01/19 with Dr. [name].  Please arrive at the [facility] (Main Entrance A) at [facility]: [place], [place] [address] at 8 am/pm. (1 hour prior to procedure unless lab work is needed; if lab work is needed arrive 1.5 hours ahead)  DIET: Nothing to eat or drink after midnight except a sip of water with medications (see medication instructions below)  Medication Instructions:  Continue your anticoagulant: Eliquis You will need to continue your anticoagulant after your procedure until you  are told by your  Provider that it is safe to stop   Labs: CBC, BMET today  You must have a responsible person to drive you home and stay in the waiting area during your procedure. Failure to do so could result in cancellation.  Bring your insurance cards.  *Special Note: Every effort is made to have your procedure done on time. Occasionally there are emergencies that occur at the hospital that may cause delays. Please be patient if a delay does occur.

## 2020-06-30 LAB — BASIC METABOLIC PANEL
BUN/Creatinine Ratio: 24 (ref 10–24)
BUN: 30 mg/dL — ABNORMAL HIGH (ref 8–27)
CO2: 25 mmol/L (ref 20–29)
Calcium: 10.6 mg/dL — ABNORMAL HIGH (ref 8.6–10.2)
Chloride: 99 mmol/L (ref 96–106)
Creatinine, Ser: 1.23 mg/dL (ref 0.76–1.27)
GFR calc Af Amer: 70 mL/min/{1.73_m2} (ref >59)
GFR calc non Af Amer: 61 mL/min/{1.73_m2} (ref >59)
Glucose: 98 mg/dL (ref 65–99)
Potassium: 5 mmol/L (ref 3.5–5.2)
Sodium: 140 mmol/L (ref 134–144)

## 2020-06-30 LAB — CBC
Hematocrit: 54.7 % — ABNORMAL HIGH (ref 37.5–51.0)
Hemoglobin: 17.6 g/dL (ref 13.0–17.7)
MCH: 28.1 pg (ref 26.6–33.0)
MCHC: 32.2 g/dL (ref 31.5–35.7)
MCV: 87 fL (ref 79–97)
Platelets: 249 10*3/uL (ref 150–450)
RBC: 6.27 x10E6/uL — ABNORMAL HIGH (ref 4.14–5.80)
RDW: 15.3 % (ref 11.6–15.4)
WBC: 7.7 10*3/uL (ref 3.4–10.8)

## 2020-07-01 ENCOUNTER — Other Ambulatory Visit (HOSPITAL_COMMUNITY)
Admission: RE | Admit: 2020-07-01 | Discharge: 2020-07-01 | Disposition: A | Discharge status: Home / Self Care | Payer: Medicare HMO | Source: Ambulatory Visit | Attending: Cardiovascular Disease | Admitting: Cardiovascular Disease

## 2020-07-01 DIAGNOSIS — Z20822 Contact with and (suspected) exposure to covid-19: Secondary | ICD-10-CM | POA: Insufficient documentation

## 2020-07-01 DIAGNOSIS — Z01812 Encounter for preprocedural laboratory examination: Secondary | ICD-10-CM | POA: Insufficient documentation

## 2020-07-01 LAB — SARS CORONAVIRUS 2 (TAT 6-24 HRS): SARS Coronavirus 2: NEGATIVE

## 2020-07-03 ENCOUNTER — Telehealth: Payer: Self-pay | Admitting: Cardiovascular Disease

## 2020-07-03 ENCOUNTER — Other Ambulatory Visit (HOSPITAL_COMMUNITY): Payer: Medicare HMO

## 2020-07-03 NOTE — Telephone Encounter (Signed)
Called patient, advised him of what to monitor for after- and to let us know of any symptoms he may have.  Patient verbalized understanding.  No other questions.

## 2020-07-03 NOTE — Telephone Encounter (Signed)
Patient states that Dr. [name] told him he needed to have someone watch him for 24 hours after his cardioversion. He wants to know what things his brother and sister in law should be looking out for. Please advise.

## 2020-07-04 DIAGNOSIS — R69 Illness, unspecified: Secondary | ICD-10-CM | POA: Diagnosis not present

## 2020-07-05 ENCOUNTER — Ambulatory Visit (HOSPITAL_COMMUNITY)
Admission: RE | Admit: 2020-07-05 | Discharge: 2020-07-05 | Disposition: A | Discharge status: Home / Self Care | Payer: Medicare HMO | Attending: Cardiovascular Disease | Admitting: Cardiovascular Disease

## 2020-07-05 ENCOUNTER — Encounter (HOSPITAL_COMMUNITY): Payer: Self-pay | Admitting: Cardiovascular Disease

## 2020-07-05 ENCOUNTER — Other Ambulatory Visit: Payer: Self-pay

## 2020-07-05 ENCOUNTER — Ambulatory Visit (HOSPITAL_COMMUNITY): Payer: Medicare HMO | Admitting: Anesthesiology

## 2020-07-05 ENCOUNTER — Encounter (HOSPITAL_COMMUNITY)
Admission: RE | Disposition: A | Discharge status: Home / Self Care | Payer: Self-pay | Source: Home / Self Care | Attending: Cardiovascular Disease

## 2020-07-05 ENCOUNTER — Other Ambulatory Visit: Payer: Self-pay | Admitting: Cardiovascular Disease

## 2020-07-05 DIAGNOSIS — Z79899 Other long term (current) drug therapy: Secondary | ICD-10-CM | POA: Diagnosis not present

## 2020-07-05 DIAGNOSIS — I4819 Other persistent atrial fibrillation: Secondary | ICD-10-CM | POA: Diagnosis not present

## 2020-07-05 DIAGNOSIS — I4891 Unspecified atrial fibrillation: Secondary | ICD-10-CM | POA: Diagnosis not present

## 2020-07-05 DIAGNOSIS — Z791 Long term (current) use of non-steroidal anti-inflammatories (NSAID): Secondary | ICD-10-CM | POA: Insufficient documentation

## 2020-07-05 DIAGNOSIS — Z881 Allergy status to other antibiotic agents status: Secondary | ICD-10-CM | POA: Insufficient documentation

## 2020-07-05 DIAGNOSIS — E119 Type 2 diabetes mellitus without complications: Secondary | ICD-10-CM | POA: Diagnosis not present

## 2020-07-05 DIAGNOSIS — I10 Essential (primary) hypertension: Secondary | ICD-10-CM | POA: Insufficient documentation

## 2020-07-05 DIAGNOSIS — Z880 Allergy status to penicillin: Secondary | ICD-10-CM | POA: Insufficient documentation

## 2020-07-05 DIAGNOSIS — K219 Gastro-esophageal reflux disease without esophagitis: Secondary | ICD-10-CM | POA: Diagnosis not present

## 2020-07-05 DIAGNOSIS — E782 Mixed hyperlipidemia: Secondary | ICD-10-CM | POA: Insufficient documentation

## 2020-07-05 DIAGNOSIS — E669 Obesity, unspecified: Secondary | ICD-10-CM | POA: Insufficient documentation

## 2020-07-05 DIAGNOSIS — G4730 Sleep apnea, unspecified: Secondary | ICD-10-CM | POA: Diagnosis not present

## 2020-07-05 DIAGNOSIS — Z7901 Long term (current) use of anticoagulants: Secondary | ICD-10-CM | POA: Insufficient documentation

## 2020-07-05 DIAGNOSIS — J45909 Unspecified asthma, uncomplicated: Secondary | ICD-10-CM | POA: Diagnosis not present

## 2020-07-05 DIAGNOSIS — Z6839 Body mass index (BMI) 39.0-39.9, adult: Secondary | ICD-10-CM | POA: Diagnosis not present

## 2020-07-05 HISTORY — PX: CARDIOVERSION: SHX1299

## 2020-07-05 LAB — GLUCOSE, CAPILLARY: Glucose-Capillary: 100 mg/dL — ABNORMAL HIGH (ref 70–99)

## 2020-07-05 SURGERY — CARDIOVERSION
Anesthesia: General

## 2020-07-05 MED ORDER — SODIUM CHLORIDE 0.9 % IV SOLN: INTRAVENOUS | Status: DC | PRN

## 2020-07-05 MED ORDER — LIDOCAINE 2% (20 MG/ML) 5 ML SYRINGE
INTRAMUSCULAR | Status: DC | PRN
  Administered 2020-07-05: 60 mg via INTRAVENOUS

## 2020-07-05 MED ORDER — DRONEDARONE HCL 400 MG PO TABS: 400.0000 mg | ORAL_TABLET | Freq: Two times a day (BID) | ORAL | 1 refills | Status: DC

## 2020-07-05 MED ORDER — SODIUM CHLORIDE 0.9 % IV SOLN: Freq: Once | INTRAVENOUS | Status: AC

## 2020-07-05 MED ORDER — PROPOFOL 10 MG/ML IV BOLUS
INTRAVENOUS | Status: DC | PRN
  Administered 2020-07-05: 100 mg via INTRAVENOUS

## 2020-07-05 NOTE — Anesthesia Preprocedure Evaluation (Signed)
Anesthesia Evaluation  Patient identified by MRN, date of birth, ID band Patient awake    Reviewed: Allergy & Precautions, NPO status , Patient's Chart, lab work & pertinent test results  Airway Mallampati: II       Dental   Pulmonary asthma , sleep apnea ,    breath sounds clear to auscultation       Cardiovascular hypertension,  Rhythm:Irregular Rate:Normal     Neuro/Psych PSYCHIATRIC DISORDERS  Neuromuscular disease    GI/Hepatic Neg liver ROS, GERD  ,  Endo/Other  diabetes  Renal/GU negative Renal ROS     Musculoskeletal  (+) Arthritis ,   Abdominal   Peds  Hematology  (+) anemia ,   Anesthesia Other Findings   Reproductive/Obstetrics                             Anesthesia Physical Anesthesia Plan  ASA: III  Anesthesia Plan: General   Post-op Pain Management:    Induction: Intravenous  PONV Risk Score and Plan: 1 and Propofol infusion  Airway Management Planned: Nasal Cannula and Simple Face Mask  Additional Equipment:   Intra-op Plan:   Post-operative Plan:   Informed Consent: I have reviewed the patients History and Physical, chart, labs and discussed the procedure including the risks, benefits and alternatives for the proposed anesthesia with the patient or authorized representative who has indicated his/her understanding and acceptance.     Dental advisory given  Plan Discussed with: CRNA and Anesthesiologist  Anesthesia Plan Comments:         Anesthesia Quick Evaluation

## 2020-07-05 NOTE — Transfer of Care (Signed)
Immediate Anesthesia Transfer of Care Note  Patient: Peter Buck  Procedure(s) Performed: CARDIOVERSION (N/A )  Patient Location: Endoscopy Unit  Anesthesia Type:General  Level of Consciousness: drowsy and responds to stimulation  Airway & Oxygen Therapy: Patient Spontanous Breathing  Post-op Assessment: Report given to RN  Post vital signs: Reviewed and stable  Last Vitals:  Vitals Value Taken Time  BP    Temp    Pulse    Resp    SpO2      Last Pain:  Vitals:   07/05/20 0845  TempSrc: Oral  PainSc: 0-No pain         Complications: No complications documented.

## 2020-07-05 NOTE — Progress Notes (Signed)
Yes it was sent.   Sent to pharmacy as: dronedarone (MULTAQ) 400 MG tablet   E-Prescribing Status: Receipt confirmed by pharmacy (07/05/2020  9:21 AM EST)

## 2020-07-05 NOTE — Anesthesia Postprocedure Evaluation (Signed)
Anesthesia Post Note  Patient: Peter Buck  Procedure(s) Performed: CARDIOVERSION (N/A )     Patient location during evaluation: PACU Anesthesia Type: General Level of consciousness: awake Pain management: pain level controlled Vital Signs Assessment: post-procedure vital signs reviewed and stable Respiratory status: spontaneous breathing Cardiovascular status: stable Postop Assessment: no apparent nausea or vomiting Anesthetic complications: no   No complications documented.  Last Vitals:  Vitals:   07/05/20 0930 07/05/20 0940  BP: 104/71 103/68  Pulse: 75 76  Resp: 12 15  Temp:    SpO2: 94% 96%    Last Pain:  Vitals:   07/05/20 0940  TempSrc:   PainSc: 0-No pain                 Dylen Mcelhannon

## 2020-07-05 NOTE — Progress Notes (Signed)
Called in dronedarone to help with rhythm control. Sent to pharmacy. He will see me in office for EKG at next visit.   [name] [name], MD, [place]  [address] [place], [place] [address] [phone]  9:20 AM

## 2020-07-05 NOTE — Interval H&P Note (Signed)
History and Physical Interval Note:  07/05/2020 8:51 AM  Peter Buck  has presented today for surgery, with the diagnosis of AFIB.  The various methods of treatment have been discussed with the patient and family. After consideration of risks, benefits and other options for treatment, the patient has consented to  Procedure(s): CARDIOVERSION (N/A) as a surgical intervention.  The patient's history has been reviewed, patient examined, no change in status, stable for surgery.  I have reviewed the patient's chart and labs.  Questions were answered to the patient's satisfaction.    DCCV. NPO. No missed doses of eliquis in >3 weeks.   [name] [name], MD, [place]  [address] [place], [place] [address] [phone]  8:51 AM

## 2020-07-05 NOTE — CV Procedure (Signed)
   DIRECT CURRENT CARDIOVERSION  NAME:  Peter Buck    MRN: [id number] DOB:  Mar 25, 1956    ADMIT DATE: 07/05/2020  Indication:  Symptomatic atrial fibrillation  Procedure Note:  The patient signed informed consent.  They have had had therapeutic anticoagulation with eliquis greater than 3 weeks.  Anesthesia was administered by Dr. Nyoka Cowden.  Adequate airway was maintained throughout and vital followed per protocol.  They were cardioverted x 1 with 200J of biphasic synchronized energy.  They converted to NSR.  There were no apparent complications.  The patient had normal neuro status and respiratory status post procedure with vitals stable as recorded elsewhere.    Follow up: They will continue on current medical therapy and follow up with cardiology as scheduled.  [name] [name], [place]  [address] [place], [place] [address] [phone]  9:17 AM

## 2020-07-07 ENCOUNTER — Telehealth: Payer: Self-pay | Admitting: Cardiovascular Disease

## 2020-07-07 DIAGNOSIS — R413 Other amnesia: Secondary | ICD-10-CM | POA: Diagnosis not present

## 2020-07-07 NOTE — Telephone Encounter (Signed)
Patient took his medication this morning. Advised patient to stay on schedule and ensure he does not miss any future doses. Patient has signed up for an automated service that calls him to remind him to take his medications so this will not be an issue again. Patient is wanting to know if it is safe to take Ashwagandha with his current medications. He reports his anxiety has been worse the last few days and he has been taking it. Will route to pharm D for advice.

## 2020-07-07 NOTE — Telephone Encounter (Signed)
Ashwaganda isn't contraindicated with his current medications, but can lower blood pressure, blood sugar, and can cause fatigue and slowed breathing. He should keep an eye out for all of these side effects and discontinue the supplement if he develops any of the above effects.

## 2020-07-07 NOTE — Telephone Encounter (Signed)
    Pt c/o medication issue:  1. Name of Medication:  ELIQUIS 5 MG TABS tablet    dronedarone (MULTAQ) 400 MG tablet    2. How are you currently taking this medication (dosage and times per day)?   3. Are you having a reaction (difficulty breathing--STAT)?   4. What is your medication issue? Pt said he for got the take this 2 meds last night. He doesn't know what to do, since he knows it is critical for him to take his meds on time. He wants to know if he needs to make any changes with his medications

## 2020-07-07 NOTE — Telephone Encounter (Signed)
Spoke with patient and relayed pharmacist recommendations. Patient verbalized understanding.

## 2020-07-07 NOTE — Telephone Encounter (Signed)
Did pt take his doses this AM like normal if he only missed last night's doses? He needs to restart both meds ASAP since he just underwent cardioversion on 1/19.

## 2020-07-10 DIAGNOSIS — M810 Age-related osteoporosis without current pathological fracture: Secondary | ICD-10-CM | POA: Diagnosis not present

## 2020-07-18 ENCOUNTER — Ambulatory Visit: Payer: Medicare HMO | Admitting: Cardiology

## 2020-07-18 DIAGNOSIS — E782 Mixed hyperlipidemia: Secondary | ICD-10-CM | POA: Diagnosis not present

## 2020-07-18 DIAGNOSIS — E291 Testicular hypofunction: Secondary | ICD-10-CM | POA: Diagnosis not present

## 2020-07-18 DIAGNOSIS — I152 Hypertension secondary to endocrine disorders: Secondary | ICD-10-CM | POA: Diagnosis not present

## 2020-07-18 DIAGNOSIS — E1159 Type 2 diabetes mellitus with other circulatory complications: Secondary | ICD-10-CM | POA: Diagnosis not present

## 2020-07-18 DIAGNOSIS — I482 Chronic atrial fibrillation, unspecified: Secondary | ICD-10-CM | POA: Diagnosis not present

## 2020-07-19 ENCOUNTER — Other Ambulatory Visit: Payer: Self-pay

## 2020-07-19 ENCOUNTER — Ambulatory Visit: Payer: Medicare HMO | Admitting: Sports Medicine

## 2020-07-19 ENCOUNTER — Encounter: Payer: Self-pay | Admitting: Sports Medicine

## 2020-07-19 DIAGNOSIS — M79675 Pain in left toe(s): Secondary | ICD-10-CM | POA: Diagnosis not present

## 2020-07-19 DIAGNOSIS — M2041 Other hammer toe(s) (acquired), right foot: Secondary | ICD-10-CM | POA: Diagnosis not present

## 2020-07-19 DIAGNOSIS — E088 Diabetes mellitus due to underlying condition with unspecified complications: Secondary | ICD-10-CM

## 2020-07-19 DIAGNOSIS — M2042 Other hammer toe(s) (acquired), left foot: Secondary | ICD-10-CM | POA: Diagnosis not present

## 2020-07-19 DIAGNOSIS — M79674 Pain in right toe(s): Secondary | ICD-10-CM | POA: Diagnosis not present

## 2020-07-19 DIAGNOSIS — B351 Tinea unguium: Secondary | ICD-10-CM | POA: Diagnosis not present

## 2020-07-19 DIAGNOSIS — M2141 Flat foot [pes planus] (acquired), right foot: Secondary | ICD-10-CM | POA: Diagnosis not present

## 2020-07-19 DIAGNOSIS — G629 Polyneuropathy, unspecified: Secondary | ICD-10-CM

## 2020-07-19 DIAGNOSIS — M2142 Flat foot [pes planus] (acquired), left foot: Secondary | ICD-10-CM | POA: Diagnosis not present

## 2020-07-19 DIAGNOSIS — E1140 Type 2 diabetes mellitus with diabetic neuropathy, unspecified: Secondary | ICD-10-CM | POA: Diagnosis not present

## 2020-07-19 NOTE — Progress Notes (Signed)
Subjective: Peter Buck is a 65 y.o. male patient with history of diabetes who presents to office today complaining of long,mildly painful nails  while ambulating in shoes; unable to trim. Patient reports that he has finished PT. States that he is also hear to pick up diabetic shoes.  Denies any other pedal complaints at this time.  Patient Active Problem List   Diagnosis Date Noted  . Morbid obesity ([place]) 04/11/2020  . Anemia   . Arthritis   . Asthma   . Bipolar disorder ([place])   . Diabetes mellitus without complication ([place])   . GERD (gastroesophageal reflux disease)   . Hypertension   . Sleep apnea   . Paroxysmal atrial fibrillation ([place]) 12/23/2019  . Essential hypertension 11/02/2019  . Diabetes mellitus due to underlying condition with unspecified complications ([place]) 42/35/3614  . Chronic pain of left knee 10/19/2019  . Primary osteoarthritis of both first carpometacarpal joints 08/25/2019  . Second degree burn of finger of right hand 08/25/2019  . Verruca 08/25/2019  . Therapeutic opioid induced constipation 06/22/2019  . Cervical radiculopathy 02/02/2019  . Chronic pain syndrome 02/02/2019  . Facet arthropathy, cervical 02/02/2019  . Foraminal stenosis of cervical region 02/02/2019  . Neuroforaminal stenosis of lumbar spine 12/17/2018  . Pain medication agreement 10/16/2018  . Pain associated with accessory navicular bone of foot, left 05/25/2018  . Aftercare following surgery 04/23/2018  . Muscle spasm of back 02/17/2018  . DDD (degenerative disc disease), lumbar 11/25/2017  . Low back pain 11/25/2017  . Body mass index (BMI) 38.0-38.9, adult 09/16/2017  . Plantar fasciitis 01/02/2017  . Arthritis, midfoot 12/05/2016  . Borderline personality disorder ([place]) 04/29/2016  . 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 Bipolar affective disorder, depressed ([place]) 02/13/2016  . Comprehensive diabetic foot examination, type 2 DM, encounter for ([place]) 12/25/2015  . Onychomycosis due to  dermatophyte 12/25/2015  . Hammertoe 04/26/2014   Current Outpatient Medications on File Prior to Visit  Medication Sig Dispense Refill  . acetaminophen (TYLENOL) 650 MG CR tablet Take 650 mg by mouth every 8 (eight) hours as needed for pain.    Marland Kitchen albuterol (VENTOLIN HFA) 108 (90 Base) MCG/ACT inhaler Inhale 2 puffs into the lungs every 6 (six) hours as needed for wheezing or shortness of breath.    . ASHWAGANDHA PO Take 400 mg by mouth See admin instructions. Once every 10-14 days    . BYSTOLIC 10 MG tablet TAKE 1 TABLET BY MOUTH TWICE A DAY (Patient taking differently: Take 10 mg by mouth in the morning and at bedtime.) 180 tablet 2  . Calcium Citrate-Vitamin D (CALCIUM CITRATE + D3 PO) Take 1 tablet by mouth at bedtime.    . carboxymethylcellulose (REFRESH PLUS) 0.5 % SOLN Place 1 drop into both eyes 3 (three) times daily as needed (dry eyes).    . celecoxib (CELEBREX) 200 MG capsule Take 200 mg by mouth daily.    . chlorpheniramine (CHLOR-TRIMETON) 4 MG tablet Take 4 mg by mouth 2 (two) times daily as needed for allergies.    Marland Kitchen colchicine 0.6 MG tablet Take 0.6 mg by mouth daily as needed (gout).    Marland Kitchen diclofenac Sodium (VOLTAREN) 1 % GEL Apply 1 application topically 4 (four) times daily as needed (pain).    Marland Kitchen dronedarone (MULTAQ) 400 MG tablet Take 1 tablet (400 mg total) by mouth 2 (two) times daily with a meal. 60 tablet 1  . ELIQUIS 5 MG TABS tablet Take 1 tablet (5 mg total) by mouth 2 (two)  times daily. 180 tablet 3  . EPINEPHrine 0.3 mg/0.3 mL IJ SOAJ injection Inject 0.3 mg into the muscle as needed for anaphylaxis.    . Horse Chestnut 300 MG CAPS Take 300 mg by mouth in the morning and at bedtime.    Marland Kitchen HYDROcodone-acetaminophen (NORCO/VICODIN) 5-325 MG tablet Take 1 tablet by mouth every 8 (eight) hours as needed for moderate pain.    Marland Kitchen lovastatin (MEVACOR) 10 MG tablet Take 10 mg by mouth daily.    Marland Kitchen lubiprostone (AMITIZA) 24 MCG capsule Take 24 mcg by mouth 2 (two) times daily  with a meal.    . mometasone (NASONEX) 50 MCG/ACT nasal spray Place 1 spray into the nose in the morning and at bedtime.    Marland Kitchen MOVANTIK 25 MG TABS tablet Take 25 mg by mouth daily as needed (opioid induced constipation.).    Marland Kitchen Oxcarbazepine (TRILEPTAL) 300 MG tablet Take 300 mg by mouth 2 (two) times daily.    Marland Kitchen OZEMPIC, 1 MG/DOSE, 4 MG/3ML SOPN Inject 1 mg into the muscle every Saturday. INJECT THE CONTENTS OF 1 PEN INJECTOR SUBQ ONCE WEEKLY    . pregabalin (LYRICA) 200 MG capsule Take 200 mg by mouth 2 (two) times daily.    . Salicylic Acid (SCALPICIN EX) Apply 1 application topically daily as needed (scalp irritation.).    Marland Kitchen sildenafil (VIAGRA) 100 MG tablet Take 100 mg by mouth daily as needed for erectile dysfunction.    Marland Kitchen testosterone cypionate (DEPOTESTOSTERONE CYPIONATE) 200 MG/ML injection Inject 200 mg into the muscle every 21 ( twenty-one) days.    . valsartan (DIOVAN) 160 MG tablet Take 160 mg by mouth at bedtime.     No current facility-administered medications on file prior to visit.   Allergies  Allergen Reactions  . Augmentin [Amoxicillin-Pot Clavulanate] Anaphylaxis  . Ivp Dye [Iodinated Diagnostic Agents] Anaphylaxis  . Ketorolac Anaphylaxis  . Toradol [Ketorolac Tromethamine] Anaphylaxis    "It put me in shock.  My blood pressure tanked and I was itchy all over; had to go to the ER."  . Atorvastatin Other (See Comments)    Severe back ache Severe back ache Severe back ache Severe back ache  . Guaifenesin Other (See Comments) and Swelling    "It sorta made my throat close up." "It sorta made my throat close up." "It sorta made my throat close up."  . Penicillins Dermatitis  . Alendronate Sodium Other (See Comments)    Unknown reaction   . Dextromethorphan Other (See Comments)    Unknown reaction  . Pseudoephedrine Other (See Comments)    "It dries me out.  I take it when my other allergy medicine isn't working."    Recent Results (from the past 2160 hour(s))   SARS CORONAVIRUS 2 (TAT 6-24 HRS) Nasopharyngeal Nasopharyngeal Swab     Status: None   Collection Time: 06/22/20  9:21 AM   Specimen: Nasopharyngeal Swab  Result Value Ref Range   SARS Coronavirus 2 NEGATIVE NEGATIVE    Comment: (NOTE) SARS-CoV-2 target nucleic acids are NOT DETECTED.  The SARS-CoV-2 RNA is generally detectable in upper and lower respiratory specimens during the acute phase of infection. Negative results do not preclude SARS-CoV-2 infection, do not rule out co-infections with other pathogens, and should not be used as the sole basis for treatment or other patient management decisions. Negative results must be combined with clinical observations, patient history, and epidemiological information. The expected result is Negative.  Fact Sheet for Patients: SugarRoll.be  Fact Sheet for  Healthcare Providers: https://www.woods-mathews.com/  This test is not yet approved or cleared by the [place] and  has been authorized for detection and/or diagnosis of SARS-CoV-2 by FDA under an Emergency Use Authorization (EUA). This EUA will remain  in effect (meaning this test can be used) for the duration of the COVID-19 declaration under Se ction 564(b)(1) of the Act, 21 U.S.C. section 360bbb-3(b)(1), unless the authorization is terminated or revoked sooner.  Performed at [facility], [place] [address] [place], [place] [address]   CBC     Status: Abnormal   Collection Time: 06/29/20 10:28 AM  Result Value Ref Range   WBC 7.7 3.4 - 10.8 x10E3/uL   RBC 6.27 (H) 4.14 - 5.80 x10E6/uL   Hemoglobin 17.6 13.0 - 17.7 g/dL   Hematocrit 54.7 (H) 37.5 - 51.0 %   MCV 87 79 - 97 fL   MCH 28.1 26.6 - 33.0 pg   MCHC 32.2 31.5 - 35.7 g/dL   RDW 15.3 11.6 - 15.4 %   Platelets 249 150 - 450 X90W4/OX  Basic metabolic panel     Status: Abnormal   Collection Time: 06/29/20 10:28 AM  Result Value Ref Range   Glucose 98 65 - 99 mg/dL    BUN 30 (H) 8 - 27 mg/dL   Creatinine, Ser 1.23 0.76 - 1.27 mg/dL   GFR calc non Af Amer 61 >59 mL/min/1.73   GFR calc Af Amer 70 >59 mL/min/1.73    Comment: **In accordance with recommendations from the NKF-ASN Task force,**   [facility] is in the process of updating its eGFR calculation to the   2021 CKD-EPI creatinine equation that estimates kidney function   without a race variable.    BUN/Creatinine Ratio 24 10 - 24   Sodium 140 134 - 144 mmol/L   Potassium 5.0 3.5 - 5.2 mmol/L   Chloride 99 96 - 106 mmol/L   CO2 25 20 - 29 mmol/L   Calcium 10.6 (H) 8.6 - 10.2 mg/dL  SARS CORONAVIRUS 2 (TAT 6-24 HRS) Nasopharyngeal Nasopharyngeal Swab     Status: None   Collection Time: 07/01/20  1:36 PM   Specimen: Nasopharyngeal Swab  Result Value Ref Range   SARS Coronavirus 2 NEGATIVE NEGATIVE    Comment: (NOTE) SARS-CoV-2 target nucleic acids are NOT DETECTED.  The SARS-CoV-2 RNA is generally detectable in upper and lower respiratory specimens during the acute phase of infection. Negative results do not preclude SARS-CoV-2 infection, do not rule out co-infections with other pathogens, and should not be used as the sole basis for treatment or other patient management decisions. Negative results must be combined with clinical observations, patient history, and epidemiological information. The expected result is Negative.  Fact Sheet for Patients: SugarRoll.be  Fact Sheet for Healthcare Providers: https://www.woods-mathews.com/  This test is not yet approved or cleared by the [place] FDA and  has been authorized for detection and/or diagnosis of SARS-CoV-2 by FDA under an Emergency Use Authorization (EUA). This EUA will remain  in effect (meaning this test can be used) for the duration of the COVID-19 declaration under Se ction 564(b)(1) of the Act, 21 U.S.C. section 360bbb-3(b)(1), unless the authorization is terminated or revoked  sooner.  Performed at [facility], [place] [address] [place], [place] [address]   Glucose, capillary     Status: Abnormal   Collection Time: 07/05/20  9:18 AM  Result Value Ref Range   Glucose-Capillary 100 (H) 70 - 99 mg/dL  Comment: Glucose reference range applies only to samples taken after fasting for at least 8 hours.    Objective: General: Patient is awake, alert, and oriented x 3 and in no acute distress.  Integument: Skin is warm, dry and supple bilateral. Nails are tender, long, thickened and dystrophic with subungual debris, consistent with onychomycosis, 1-5 bilateral. No signs of infection.  Dry skin bilateral.  No open lesions or preulcerative lesions present bilateral. Remaining integument unremarkable.  Vasculature:  Dorsalis Pedis pulse 1/4 bilateral. Posterior Tibial pulse  0/4 bilateral. Capillary fill time <5 sec 1-5 bilateral.  Scant positive hair growth to the level of the digits.Temperature gradient within normal limits.  Mild varicosities present bilateral.  1+ pitting edema noted bilateral.  Neurology: Protective sensation intact vibratory sensation diminished bilateral.  No Babinski sign present bilateral.   Musculoskeletal: Asymptomatic pes planus and hammertoe deformity noted bilateral. There is limited ankle joint range of motion bilateral consistent with equinus deformity.  No tenderness with calf compression bilateral.  Assessment and Plan: Problem List Items Addressed This Visit      Endocrine   Diabetes mellitus due to underlying condition with unspecified complications ([place])    Other Visit Diagnoses    Pain due to onychomycosis of toenails of both feet    -  Primary   Neuropathy         -Examined patient. -Discussed and educated patient on diabetic foot care, especially with regards to the vascular, neurological and musculoskeletal systems.  -Stressed the importance of good glycemic control and the detriment of not  controlling glucose  levels in relation to the foot. -Mechanically debrided all nails 1-5 bilateral using sterile nail nipper and filed with dremel without incident  -Attempted to dispense diabetic shoes and insoles the patient however with the boots he was having pinching in the arch areas on both feet and having heel slippage in the shoes feeling very loose; diabetic shoes were not dispensed at today's visit patient to return to office to see Liliane Channel for reassessment of these shoes -Answered all patient questions -Patient to return  in 3 months for at risk foot care -Patient advised to call the office if any problems or questions arise in the meantime.  Landis Martins, DPM

## 2020-07-20 DIAGNOSIS — Z6838 Body mass index (BMI) 38.0-38.9, adult: Secondary | ICD-10-CM | POA: Diagnosis not present

## 2020-07-20 DIAGNOSIS — R69 Illness, unspecified: Secondary | ICD-10-CM | POA: Diagnosis not present

## 2020-07-25 DIAGNOSIS — E118 Type 2 diabetes mellitus with unspecified complications: Secondary | ICD-10-CM | POA: Diagnosis not present

## 2020-07-25 DIAGNOSIS — I4891 Unspecified atrial fibrillation: Secondary | ICD-10-CM | POA: Diagnosis not present

## 2020-07-25 DIAGNOSIS — Z6838 Body mass index (BMI) 38.0-38.9, adult: Secondary | ICD-10-CM | POA: Diagnosis not present

## 2020-07-25 DIAGNOSIS — I4811 Longstanding persistent atrial fibrillation: Secondary | ICD-10-CM | POA: Diagnosis not present

## 2020-07-25 DIAGNOSIS — E088 Diabetes mellitus due to underlying condition with unspecified complications: Secondary | ICD-10-CM | POA: Diagnosis not present

## 2020-07-25 DIAGNOSIS — E669 Obesity, unspecified: Secondary | ICD-10-CM | POA: Diagnosis not present

## 2020-07-25 DIAGNOSIS — I10 Essential (primary) hypertension: Secondary | ICD-10-CM | POA: Diagnosis not present

## 2020-07-25 DIAGNOSIS — Z01818 Encounter for other preprocedural examination: Secondary | ICD-10-CM | POA: Insufficient documentation

## 2020-07-25 DIAGNOSIS — G4733 Obstructive sleep apnea (adult) (pediatric): Secondary | ICD-10-CM | POA: Diagnosis not present

## 2020-07-25 DIAGNOSIS — I440 Atrioventricular block, first degree: Secondary | ICD-10-CM | POA: Diagnosis not present

## 2020-07-26 ENCOUNTER — Other Ambulatory Visit: Payer: Self-pay

## 2020-07-26 ENCOUNTER — Ambulatory Visit (INDEPENDENT_AMBULATORY_CARE_PROVIDER_SITE_OTHER): Payer: Medicare HMO | Admitting: Orthotics

## 2020-07-26 DIAGNOSIS — G629 Polyneuropathy, unspecified: Secondary | ICD-10-CM

## 2020-07-26 DIAGNOSIS — M79675 Pain in left toe(s): Secondary | ICD-10-CM

## 2020-07-26 DIAGNOSIS — E088 Diabetes mellitus due to underlying condition with unspecified complications: Secondary | ICD-10-CM

## 2020-07-26 DIAGNOSIS — B351 Tinea unguium: Secondary | ICD-10-CM

## 2020-07-26 NOTE — Progress Notes (Signed)
Cardiology Office Note:   Date:  07/27/2020  NAME:  Bernard Slayden    MRN: [id number] DOB:  1955-07-12   PCP:  [name], MD  Cardiologist:  No primary care provider on file.   Referring MD: [name], MD   Chief Complaint  Patient presents with  . Atrial Fibrillation   History of Present Illness:   [name] is a 65 y.o. male with a hx of obesity, persistent Afib, obesity, HTN, DM who presents for follow-up. Underwent DCCV for Afib 07/05/2020. Started on dronedarone.  EKG shows he is maintaining sinus rhythm.  He is feeling better.  He has purchased a cardia mobile device but is not started using it.  His diet slow down weight is stable around 3 to 20 pounds.  He has plans to exercise and diet more.  BP 115/73.  We discussed that his cholesterol level could be a bit lower.  I recommended switching to Crestor.  He is okay to do this.  He denies any chest pain or shortness of breath.  He will have arm surgery at [facility] on 22 February.  He needs preoperative assessment.  He can climb a flight of stairs without chest pain or shortness of breath.  He had a low-grade stress test last year and echocardiogram normal LV function.  Overall he is without symptoms and can proceed with surgery.  He will stop his Eliquis 3 days before surgery.  He denies any chest pain.  Overall doing well.  Maintaining sinus rhythm.  Problem List 1. Persistent Atrial fibrillation -CHADSVASC=5(age, DM, HTN, TIA) -failed DCCV 10/2019 -MPI normal 10/2019 (breast attenuation) -DCCV 07/05/2020 2. Morbid obesity -BMI 39 3. OSA 4. HTN 5. DM -A1c 6.0 6. HLD -T chol 168, LDL 97, HDL 47, TG 138  Past Medical History: Past Medical History:  Diagnosis Date  . Aftercare following surgery 04/23/2018  . Anemia   . Arthritis   . Arthritis, midfoot 12/05/2016  . Asthma   . Bipolar affective disorder, depressed ([place]) 02/13/2016  . Bipolar disorder ([place])   . Body mass index (bmi) 38.0-38.9, adult 09/16/2017  .  Body mass index (BMI) 38.0-38.9, adult 09/16/2017   Formatting of this note might be different from the original. IMO 10/01 Updates  . Borderline personality disorder ([place]) 04/29/2016  . Cervical radiculopathy 02/02/2019   Last Assessment & Plan:  Formatting of this note might be different from the original. MRI of the cervical spine shows cervical spondylosis with facet arthrosis most notable for mild to moderate left neural foraminal stenosis at C3-4. Per patient, nerve conduction studies showed right C7-8 radiculopathy.   Continue with current regimen as outlined in lumbar degenerative disc plan.  . Chronic pain of left knee 10/19/2019  . Chronic pain syndrome 02/02/2019  . Comprehensive diabetic foot examination, type 2 DM, encounter for ([place]) 12/25/2015  . DDD (degenerative disc disease), lumbar 11/25/2017   Last Assessment & Plan:  Formatting of this note might be different from the original. 65 year old male with chronic, predominantly left-sided low back pain with radiation into the left hip and groin.  Today we reviewed his recently updated lumbar MRI which shows impingement of the left L2 nerve root due to broad-based disc bulging lateralizing to the left and projecting into the far left lateral   . Diabetes mellitus due to underlying condition with unspecified complications ([place]) 2/42/6834  . Diabetes mellitus without complication ([place])   . Essential hypertension 11/02/2019  . Facet arthropathy, cervical 02/02/2019  . Foraminal stenosis  of cervical region 02/02/2019  . GERD (gastroesophageal reflux disease)   . Hammertoe 04/26/2014  . Hypertension   . 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 Low back pain 11/25/2017   Last Assessment & Plan:  Calloway Andrus is a 65 y.o. male with Low back pain, and acute on chronic low back pain with left hip, knee, and foot pain.  Today I will treat with the patient's current medication of tramadol and diclofenac and add relafen (temporarily d/c diclofenac) for 10 days  and increase lyrica. He will continue with his current appt with his neurosurgery.  Mr. Rowlette will return to   . Muscle spasm of back 02/17/2018   Last Assessment & Plan:  Formatting of this note might be different from the original. Continue with tizanidine 4 mg as needed for muscle spasming up to 3 times daily.  . Neuroforaminal stenosis of lumbar spine 12/17/2018  . Onychomycosis due to dermatophyte 12/25/2015  . Pain associated with accessory navicular bone of foot, left 05/25/2018  . Pain medication agreement 10/16/2018   Last Assessment & Plan:  Formatting of this note might be different from the original. Review of prior UDS results are within normal limits.  [place] controlled substance registry reviewed and there were no inconsistencies noted.  . Paroxysmal atrial fibrillation ([place]) 12/23/2019  . Plantar fasciitis 01/02/2017  . Primary osteoarthritis of both first carpometacarpal joints 08/25/2019  . Second degree burn of finger of right hand 08/25/2019  . Sleep apnea   . Therapeutic opioid induced constipation 06/22/2019   Last Assessment & Plan:  Formatting of this note might be different from the original. Continue amitiza 71mcg b.i.d.  . [place]ca 08/25/2019    Past Surgical History: Past Surgical History:  Procedure Laterality Date  . APPENDECTOMY    . CARDIOVERSION N/A 07/05/2020   Procedure: CARDIOVERSION;  Surgeon: [name], MD;  Location: [facility];  Service: Cardiovascular;  Laterality: N/A;  . CHOLECYSTECTOMY    . INNER EAR SURGERY    . KNEE ARTHROSCOPY      Current Medications: Current Meds  Medication Sig  . acetaminophen (TYLENOL) 650 MG CR tablet Take 650 mg by mouth every 8 (eight) hours as needed for pain.  Marland Kitchen albuterol (VENTOLIN HFA) 108 (90 Base) MCG/ACT inhaler Inhale 2 puffs into the lungs every 6 (six) hours as needed for wheezing or shortness of breath.  . ASHWAGANDHA PO Take 400 mg by mouth See admin instructions. Once every 10-14 days  .  BYSTOLIC 10 MG tablet TAKE 1 TABLET BY MOUTH TWICE A DAY (Patient taking differently: Take 10 mg by mouth in the morning and at bedtime.)  . Calcium Citrate-Vitamin D (CALCIUM CITRATE + D3 PO) Take 1 tablet by mouth at bedtime.  . carboxymethylcellulose (REFRESH PLUS) 0.5 % SOLN Place 1 drop into both eyes 3 (three) times daily as needed (dry eyes).  . celecoxib (CELEBREX) 200 MG capsule Take 200 mg by mouth daily.  . chlorpheniramine (CHLOR-TRIMETON) 4 MG tablet Take 4 mg by mouth 2 (two) times daily as needed for allergies.  Marland Kitchen colchicine 0.6 MG tablet Take 0.6 mg by mouth daily as needed (gout).  Marland Kitchen diclofenac Sodium (VOLTAREN) 1 % GEL Apply 1 application topically 4 (four) times daily as needed (pain).  Marland Kitchen EPINEPHrine 0.3 mg/0.3 mL IJ SOAJ injection Inject 0.3 mg into the muscle as needed for anaphylaxis.  . Horse Chestnut 300 MG CAPS Take 300 mg by mouth in the morning and at bedtime.  Marland Kitchen HYDROcodone-acetaminophen (NORCO/VICODIN) 5-325  MG tablet Take 1 tablet by mouth every 8 (eight) hours as needed for moderate pain.  Marland Kitchen lubiprostone (AMITIZA) 24 MCG capsule Take 24 mcg by mouth 2 (two) times daily with a meal.  . mometasone (NASONEX) 50 MCG/ACT nasal spray Place 1 spray into the nose in the morning and at bedtime.  Marland Kitchen MOVANTIK 25 MG TABS tablet Take 25 mg by mouth daily as needed (opioid induced constipation.).  Marland Kitchen Oxcarbazepine (TRILEPTAL) 300 MG tablet Take 300 mg by mouth 2 (two) times daily.  Marland Kitchen OZEMPIC, 1 MG/DOSE, 4 MG/3ML SOPN Inject 1 mg into the muscle every Saturday. INJECT THE CONTENTS OF 1 PEN INJECTOR SUBQ ONCE WEEKLY  . pregabalin (LYRICA) 200 MG capsule Take 200 mg by mouth 2 (two) times daily.  . rosuvastatin (CRESTOR) 20 MG tablet Take 1 tablet (20 mg total) by mouth daily.  . sildenafil (VIAGRA) 100 MG tablet Take 100 mg by mouth daily as needed for erectile dysfunction.  Marland Kitchen testosterone cypionate (DEPOTESTOSTERONE CYPIONATE) 200 MG/ML injection Inject 200 mg into the muscle every 21  ( twenty-one) days.  . valsartan (DIOVAN) 160 MG tablet Take 160 mg by mouth at bedtime.  . [DISCONTINUED] dronedarone (MULTAQ) 400 MG tablet Take 1 tablet (400 mg total) by mouth 2 (two) times daily with a meal.  . [DISCONTINUED] ELIQUIS 5 MG TABS tablet Take 1 tablet (5 mg total) by mouth 2 (two) times daily.  . [DISCONTINUED] lovastatin (MEVACOR) 10 MG tablet Take 10 mg by mouth daily.  . [DISCONTINUED] Salicylic Acid (SCALPICIN EX) Apply 1 application topically daily as needed (scalp irritation.).     Allergies:    Augmentin [amoxicillin-pot clavulanate], Ivp dye [iodinated diagnostic agents], Ketorolac, Toradol [ketorolac tromethamine], Atorvastatin, Guaifenesin, Penicillins, Alendronate sodium, Dextromethorphan, and Pseudoephedrine   Social History: Social History   Socioeconomic History  . Marital status: Divorced    Spouse name: Not on file  . Number of children: Not on file  . Years of education: Not on file  . Highest education level: Not on file  Occupational History  . Occupation: retired  Tobacco Use  . Smoking status: Never Smoker  . Smokeless tobacco: Never Used  Vaping Use  . Vaping Use: Never used  Substance and Sexual Activity  . Alcohol use: No  . Drug use: No  . Sexual activity: Not on file  Other Topics Concern  . Not on file  Social History Narrative  . Not on file   Social Determinants of Health   Financial Resource Strain: Not on file  Food Insecurity: Not on file  Transportation Needs: Not on file  Physical Activity: Not on file  Stress: Not on file  Social Connections: Not on file    Family History: The patient's family history includes Macular degeneration in his mother; Thyroid disease in his mother.  ROS:   All other ROS reviewed and negative. Pertinent positives noted in the HPI.     EKGs/Labs/Other Studies Reviewed:   The following studies were personally reviewed by me today:  EKG:  EKG is ordered today.  The ekg ordered today  demonstrates sinus rhythm with first-degree block, heart rate 74, and was personally reviewed by me.   NM Stress 10/25/2019  -anteroseptal infarct without ischemia, EF 53%  Echo 10/23/2019 EF 55-60%  Recent Labs: 06/29/2020: BUN 30; Creatinine, Ser 1.23; Hemoglobin 17.6; Platelets 249; Potassium 5.0; Sodium 140   Recent Lipid Panel No results found for: CHOL, TRIG, HDL, CHOLHDL, VLDL, LDLCALC, LDLDIRECT  Physical Exam:   VS:  BP 115/73   Pulse 74   Ht 6' 4.5" (1.943 m)   Wt (!) 320 lb (145.2 kg)   SpO2 97%   BMI 38.44 kg/m    Wt Readings from Last 3 Encounters:  07/27/20 (!) 320 lb (145.2 kg)  06/29/20 (!) 317 lb (143.8 kg)  05/19/20 (!) 327 lb (148.3 kg)    General: Well nourished, well developed, in no acute distress Head: Atraumatic, normal size  Eyes: PEERLA, EOMI  Neck: Supple, no JVD Endocrine: No thryomegaly Cardiac: Normal S1, S2; RRR; no murmurs, rubs, or gallops Lungs: Clear to auscultation bilaterally, no wheezing, rhonchi or rales  Abd: Soft, nontender, no hepatomegaly  Ext: No edema, pulses 2+ Musculoskeletal: No deformities, BUE and BLE strength normal and equal Skin: Warm and dry, no rashes   Neuro: Alert and oriented to person, place, time, and situation, CNII-XII grossly intact, no focal deficits  Psych: Normal mood and affect   ASSESSMENT:   Koah Chisenhall is a 65 y.o. male who presents for the following: 1. Persistent atrial fibrillation ([place])   2. Primary hypertension   3. Mixed hyperlipidemia   4. Obesity (BMI 30-39.9)   5. Preoperative cardiovascular examination     PLAN:   1. Persistent atrial fibrillation ([facility]) -CHADSVASC=5(age, DM, HTN, TIA) -failed DCCV 10/2019 -MPI normal 10/2019 (breast attenuation) -DCCV 07/05/2020 -Recent cardioversion was successful.  Maintaining sinus rhythm.  We decided on Dronedarone 40 mg twice a day.  He had an equivocal stress test last year.  I do not think he has CAD he has no symptoms of this.  However, I do  not believe a 1C agent will hold him in rhythm.  We will continue this for now.  There is no evidence of heart failure.  He seems to be doing well. -He will continue Eliquis 5 mg twice daily for stroke prophylaxis.  Instructions given regarding upcoming procedure.  2. Primary hypertension -BP well controlled.  He will continue valsartan 160 mg daily.  On labetalol 10 mg twice daily.  3. Mixed hyperlipidemia -He is diabetic.  Most recent LDL cholesterol 97.  We will switch him to Crestor 20 mg daily.  Goal LDL less than 70.  4. Obesity (BMI 30-39.9) -Diet and exercise recommended.  5. Preoperative cardiovascular examination -He may proceed to surgery.  His surgery will take place 5 weeks after his cardioversion this is acceptable to hold his Eliquis.  Surgery is on 08/08/2020.  He will hold Eliquis after his evening dose on 08/05/2020.  This will give him 3 days prior to surgery.  He will then resume Eliquis at the discretion of the surgeon once hemostasis is achieved and bleeding risk is deemed to be low.  Disposition: Return in about 3 months (around 10/24/2020).  Medication Adjustments/Labs and Tests Ordered: Current medicines are reviewed at length with the patient today.  Concerns regarding medicines are outlined above.  Orders Placed This Encounter  Procedures  . EKG 12-Lead   Meds ordered this encounter  Medications  . dronedarone (MULTAQ) 400 MG tablet    Sig: Take 1 tablet (400 mg total) by mouth 2 (two) times daily with a meal.    Dispense:  60 tablet    Refill:  1  . ELIQUIS 5 MG TABS tablet    Sig: Take 1 tablet (5 mg total) by mouth 2 (two) times daily.    Dispense:  180 tablet    Refill:  3  . rosuvastatin (CRESTOR) 20 MG tablet    Sig:  Take 1 tablet (20 mg total) by mouth daily.    Dispense:  90 tablet    Refill:  3    Patient Instructions  Medication Instructions:  Stop Lovastatin Start Crestor 20 mg daily   Hold Eliquis 2/19- last dose will be Saturday @ 6 PM.  You will resume via the surgeon.  *If you need a refill on your cardiac medications before your next appointment, please call your pharmacy*   Follow-Up: At [facility], you and your health needs are our priority.  As part of our continuing mission to provide you with exceptional heart care, we have created designated Provider Care Teams.  These Care Teams include your primary Cardiologist (physician) and Advanced Practice Providers (APPs -  Physician Assistants and Nurse Practitioners) who all work together to provide you with the care you need, when you need it.  We recommend signing up for the patient portal called "MyChart".  Sign up information is provided on this After Visit Summary.  MyChart is used to connect with patients for Virtual Visits (Telemedicine).  Patients are able to view lab/test results, encounter notes, upcoming appointments, etc.  Non-urgent messages can be sent to your provider as well.   To learn more about what you can do with MyChart, go to [email or web address].    Your next appointment:   3 month(s)  The format for your next appointment:   In Person  Provider:   Eleonore Chiquito, MD        Time Spent with Patient: I have spent a total of 35 minutes with patient reviewing hospital notes, telemetry, EKGs, labs and examining the patient as well as establishing an assessment and plan that was discussed with the patient.  > 50% of time was spent in direct patient care.  Signed, Addison Naegeli. [name], [place]  [address] [place], [place] [address] [phone]  07/27/2020 10:27 AM

## 2020-07-26 NOTE — Progress Notes (Signed)
Patient picked up shoes on 07/19/2020; brought back for me to adjust inserts; adjusted on (arch fitting) and will take back to [place] to carve out RIGHT.

## 2020-07-27 ENCOUNTER — Other Ambulatory Visit: Payer: Self-pay | Admitting: Cardiovascular Disease

## 2020-07-27 ENCOUNTER — Encounter: Payer: Self-pay | Admitting: Cardiovascular Disease

## 2020-07-27 ENCOUNTER — Ambulatory Visit: Payer: Medicare HMO | Admitting: Cardiovascular Disease

## 2020-07-27 ENCOUNTER — Telehealth: Payer: Self-pay | Admitting: Cardiovascular Disease

## 2020-07-27 VITALS — BP 115/73 | HR 74 | Ht 76.5 in | Wt 320.0 lb

## 2020-07-27 DIAGNOSIS — E669 Obesity, unspecified: Secondary | ICD-10-CM

## 2020-07-27 DIAGNOSIS — Z01810 Encounter for preprocedural cardiovascular examination: Secondary | ICD-10-CM

## 2020-07-27 DIAGNOSIS — E782 Mixed hyperlipidemia: Secondary | ICD-10-CM | POA: Diagnosis not present

## 2020-07-27 DIAGNOSIS — I10 Essential (primary) hypertension: Secondary | ICD-10-CM

## 2020-07-27 DIAGNOSIS — I4819 Other persistent atrial fibrillation: Secondary | ICD-10-CM | POA: Diagnosis not present

## 2020-07-27 MED ORDER — ELIQUIS 5 MG PO TABS: 5.0000 mg | ORAL_TABLET | Freq: Two times a day (BID) | ORAL | 3 refills | Status: DC

## 2020-07-27 MED ORDER — DRONEDARONE HCL 400 MG PO TABS: 400.0000 mg | ORAL_TABLET | Freq: Two times a day (BID) | ORAL | 1 refills | Status: DC

## 2020-07-27 MED ORDER — ROSUVASTATIN CALCIUM 20 MG PO TABS: 20.0000 mg | ORAL_TABLET | Freq: Every day | ORAL | 3 refills | Status: DC

## 2020-07-27 NOTE — Telephone Encounter (Signed)
Called Gabriel Cirri, advised I had not seen the clearance yet, but would fax over the office note from today with clearance- Gabriel Cirri advised this was fine, and gave me fax number [phone].  Faxed over via epic today 07/27/2020. Advised if she did not receive it to let me know.

## 2020-07-27 NOTE — Patient Instructions (Signed)
Medication Instructions:  Stop Lovastatin Start Crestor 20 mg daily   Hold Eliquis 2/19- last dose will be Saturday @ 6 PM. You will resume via the surgeon.  *If you need a refill on your cardiac medications before your next appointment, please call your pharmacy*   Follow-Up: At [facility], you and your health needs are our priority.  As part of our continuing mission to provide you with exceptional heart care, we have created designated Provider Care Teams.  These Care Teams include your primary Cardiologist (physician) and Advanced Practice Providers (APPs -  Physician Assistants and Nurse Practitioners) who all work together to provide you with the care you need, when you need it.  We recommend signing up for the patient portal called "MyChart".  Sign up information is provided on this After Visit Summary.  MyChart is used to connect with patients for Virtual Visits (Telemedicine).  Patients are able to view lab/test results, encounter notes, upcoming appointments, etc.  Non-urgent messages can be sent to your provider as well.   To learn more about what you can do with MyChart, go to [email or web address].    Your next appointment:   3 month(s)  The format for your next appointment:   In Person  Provider:   Eleonore Chiquito, MD

## 2020-07-27 NOTE — Telephone Encounter (Signed)
Patient seen in office today by MD Routed to [facility] LPN

## 2020-07-27 NOTE — Telephone Encounter (Signed)
     Gabriel Cirri with [facility] calling to f/u clearance request, she said she faxed over a from for Dr. [name] to fill up, per appt notes clearance was received. She said to call her if Dr. [name] has any questions.

## 2020-08-01 DIAGNOSIS — Z6839 Body mass index (BMI) 39.0-39.9, adult: Secondary | ICD-10-CM | POA: Diagnosis not present

## 2020-08-01 DIAGNOSIS — M5116 Intervertebral disc disorders with radiculopathy, lumbar region: Secondary | ICD-10-CM | POA: Diagnosis not present

## 2020-08-01 DIAGNOSIS — M4726 Other spondylosis with radiculopathy, lumbar region: Secondary | ICD-10-CM | POA: Diagnosis not present

## 2020-08-01 DIAGNOSIS — Z7189 Other specified counseling: Secondary | ICD-10-CM | POA: Diagnosis not present

## 2020-08-01 DIAGNOSIS — Z79899 Other long term (current) drug therapy: Secondary | ICD-10-CM | POA: Diagnosis not present

## 2020-08-01 DIAGNOSIS — T402X5A Adverse effect of other opioids, initial encounter: Secondary | ICD-10-CM | POA: Diagnosis not present

## 2020-08-01 DIAGNOSIS — Z760 Encounter for issue of repeat prescription: Secondary | ICD-10-CM | POA: Diagnosis not present

## 2020-08-01 DIAGNOSIS — G8929 Other chronic pain: Secondary | ICD-10-CM | POA: Diagnosis not present

## 2020-08-01 DIAGNOSIS — Z79891 Long term (current) use of opiate analgesic: Secondary | ICD-10-CM | POA: Diagnosis not present

## 2020-08-01 DIAGNOSIS — K5903 Drug induced constipation: Secondary | ICD-10-CM | POA: Diagnosis not present

## 2020-08-07 DIAGNOSIS — E291 Testicular hypofunction: Secondary | ICD-10-CM | POA: Diagnosis not present

## 2020-08-08 DIAGNOSIS — E119 Type 2 diabetes mellitus without complications: Secondary | ICD-10-CM | POA: Diagnosis not present

## 2020-08-08 DIAGNOSIS — M1811 Unilateral primary osteoarthritis of first carpometacarpal joint, right hand: Secondary | ICD-10-CM | POA: Diagnosis not present

## 2020-08-08 DIAGNOSIS — Z794 Long term (current) use of insulin: Secondary | ICD-10-CM | POA: Diagnosis not present

## 2020-08-08 DIAGNOSIS — I10 Essential (primary) hypertension: Secondary | ICD-10-CM | POA: Diagnosis not present

## 2020-08-13 DIAGNOSIS — H6122 Impacted cerumen, left ear: Secondary | ICD-10-CM | POA: Diagnosis not present

## 2020-08-13 DIAGNOSIS — R5383 Other fatigue: Secondary | ICD-10-CM | POA: Diagnosis not present

## 2020-08-13 DIAGNOSIS — H9202 Otalgia, left ear: Secondary | ICD-10-CM | POA: Diagnosis not present

## 2020-08-13 DIAGNOSIS — R070 Pain in throat: Secondary | ICD-10-CM | POA: Diagnosis not present

## 2020-08-13 DIAGNOSIS — J029 Acute pharyngitis, unspecified: Secondary | ICD-10-CM | POA: Diagnosis not present

## 2020-08-13 DIAGNOSIS — H6121 Impacted cerumen, right ear: Secondary | ICD-10-CM | POA: Diagnosis not present

## 2020-08-13 DIAGNOSIS — Z20828 Contact with and (suspected) exposure to other viral communicable diseases: Secondary | ICD-10-CM | POA: Diagnosis not present

## 2020-08-13 DIAGNOSIS — H9201 Otalgia, right ear: Secondary | ICD-10-CM | POA: Diagnosis not present

## 2020-08-14 DIAGNOSIS — Z4789 Encounter for other orthopedic aftercare: Secondary | ICD-10-CM | POA: Diagnosis not present

## 2020-08-15 DIAGNOSIS — I482 Chronic atrial fibrillation, unspecified: Secondary | ICD-10-CM | POA: Diagnosis not present

## 2020-08-15 DIAGNOSIS — E1159 Type 2 diabetes mellitus with other circulatory complications: Secondary | ICD-10-CM | POA: Diagnosis not present

## 2020-08-15 DIAGNOSIS — I152 Hypertension secondary to endocrine disorders: Secondary | ICD-10-CM | POA: Diagnosis not present

## 2020-08-15 DIAGNOSIS — E782 Mixed hyperlipidemia: Secondary | ICD-10-CM | POA: Diagnosis not present

## 2020-08-17 DIAGNOSIS — Z9889 Other specified postprocedural states: Secondary | ICD-10-CM | POA: Diagnosis not present

## 2020-08-17 DIAGNOSIS — R2233 Localized swelling, mass and lump, upper limb, bilateral: Secondary | ICD-10-CM | POA: Diagnosis not present

## 2020-08-17 DIAGNOSIS — M7989 Other specified soft tissue disorders: Secondary | ICD-10-CM | POA: Diagnosis not present

## 2020-08-21 DIAGNOSIS — M79641 Pain in right hand: Secondary | ICD-10-CM | POA: Diagnosis not present

## 2020-08-24 DIAGNOSIS — Z6839 Body mass index (BMI) 39.0-39.9, adult: Secondary | ICD-10-CM | POA: Diagnosis not present

## 2020-08-24 DIAGNOSIS — M79675 Pain in left toe(s): Secondary | ICD-10-CM | POA: Diagnosis not present

## 2020-08-26 ENCOUNTER — Other Ambulatory Visit: Payer: Self-pay | Admitting: Cardiovascular Disease

## 2020-08-28 DIAGNOSIS — E291 Testicular hypofunction: Secondary | ICD-10-CM | POA: Diagnosis not present

## 2020-09-01 DIAGNOSIS — K602 Anal fissure, unspecified: Secondary | ICD-10-CM | POA: Diagnosis not present

## 2020-09-01 DIAGNOSIS — Z6839 Body mass index (BMI) 39.0-39.9, adult: Secondary | ICD-10-CM | POA: Diagnosis not present

## 2020-09-05 DIAGNOSIS — Z4789 Encounter for other orthopedic aftercare: Secondary | ICD-10-CM | POA: Diagnosis not present

## 2020-09-12 DIAGNOSIS — R69 Illness, unspecified: Secondary | ICD-10-CM | POA: Diagnosis not present

## 2020-09-15 DIAGNOSIS — R69 Illness, unspecified: Secondary | ICD-10-CM | POA: Diagnosis not present

## 2020-09-15 DIAGNOSIS — E1159 Type 2 diabetes mellitus with other circulatory complications: Secondary | ICD-10-CM | POA: Diagnosis not present

## 2020-09-15 DIAGNOSIS — E782 Mixed hyperlipidemia: Secondary | ICD-10-CM | POA: Diagnosis not present

## 2020-09-18 DIAGNOSIS — M1811 Unilateral primary osteoarthritis of first carpometacarpal joint, right hand: Secondary | ICD-10-CM | POA: Diagnosis not present

## 2020-09-18 DIAGNOSIS — Z4789 Encounter for other orthopedic aftercare: Secondary | ICD-10-CM | POA: Diagnosis not present

## 2020-09-19 DIAGNOSIS — E291 Testicular hypofunction: Secondary | ICD-10-CM | POA: Diagnosis not present

## 2020-09-19 DIAGNOSIS — E782 Mixed hyperlipidemia: Secondary | ICD-10-CM | POA: Diagnosis not present

## 2020-09-19 DIAGNOSIS — E1159 Type 2 diabetes mellitus with other circulatory complications: Secondary | ICD-10-CM | POA: Diagnosis not present

## 2020-09-19 DIAGNOSIS — Z125 Encounter for screening for malignant neoplasm of prostate: Secondary | ICD-10-CM | POA: Diagnosis not present

## 2020-09-25 DIAGNOSIS — E1140 Type 2 diabetes mellitus with diabetic neuropathy, unspecified: Secondary | ICD-10-CM | POA: Diagnosis not present

## 2020-09-25 DIAGNOSIS — E782 Mixed hyperlipidemia: Secondary | ICD-10-CM | POA: Diagnosis not present

## 2020-09-25 DIAGNOSIS — E1159 Type 2 diabetes mellitus with other circulatory complications: Secondary | ICD-10-CM | POA: Diagnosis not present

## 2020-09-25 DIAGNOSIS — I152 Hypertension secondary to endocrine disorders: Secondary | ICD-10-CM | POA: Diagnosis not present

## 2020-10-06 DIAGNOSIS — M25562 Pain in left knee: Secondary | ICD-10-CM | POA: Diagnosis not present

## 2020-10-06 DIAGNOSIS — G8929 Other chronic pain: Secondary | ICD-10-CM | POA: Diagnosis not present

## 2020-10-06 DIAGNOSIS — M1712 Unilateral primary osteoarthritis, left knee: Secondary | ICD-10-CM | POA: Diagnosis not present

## 2020-10-09 DIAGNOSIS — M1811 Unilateral primary osteoarthritis of first carpometacarpal joint, right hand: Secondary | ICD-10-CM | POA: Diagnosis not present

## 2020-10-09 DIAGNOSIS — M180 Bilateral primary osteoarthritis of first carpometacarpal joints: Secondary | ICD-10-CM | POA: Diagnosis not present

## 2020-10-10 DIAGNOSIS — R69 Illness, unspecified: Secondary | ICD-10-CM | POA: Diagnosis not present

## 2020-10-10 DIAGNOSIS — E291 Testicular hypofunction: Secondary | ICD-10-CM | POA: Diagnosis not present

## 2020-10-15 DIAGNOSIS — E1159 Type 2 diabetes mellitus with other circulatory complications: Secondary | ICD-10-CM | POA: Diagnosis not present

## 2020-10-15 DIAGNOSIS — R69 Illness, unspecified: Secondary | ICD-10-CM | POA: Diagnosis not present

## 2020-10-15 DIAGNOSIS — E782 Mixed hyperlipidemia: Secondary | ICD-10-CM | POA: Diagnosis not present

## 2020-10-15 DIAGNOSIS — E1140 Type 2 diabetes mellitus with diabetic neuropathy, unspecified: Secondary | ICD-10-CM | POA: Diagnosis not present

## 2020-10-17 ENCOUNTER — Ambulatory Visit: Payer: Medicare HMO | Admitting: Sports Medicine

## 2020-10-17 DIAGNOSIS — Z4789 Encounter for other orthopedic aftercare: Secondary | ICD-10-CM | POA: Diagnosis not present

## 2020-10-18 ENCOUNTER — Encounter: Payer: Self-pay | Admitting: Sports Medicine

## 2020-10-18 ENCOUNTER — Ambulatory Visit: Payer: Medicare HMO | Admitting: Sports Medicine

## 2020-10-18 ENCOUNTER — Other Ambulatory Visit: Payer: Self-pay

## 2020-10-18 DIAGNOSIS — M79674 Pain in right toe(s): Secondary | ICD-10-CM | POA: Diagnosis not present

## 2020-10-18 DIAGNOSIS — E088 Diabetes mellitus due to underlying condition with unspecified complications: Secondary | ICD-10-CM

## 2020-10-18 DIAGNOSIS — B351 Tinea unguium: Secondary | ICD-10-CM | POA: Diagnosis not present

## 2020-10-18 DIAGNOSIS — M79675 Pain in left toe(s): Secondary | ICD-10-CM | POA: Diagnosis not present

## 2020-10-18 DIAGNOSIS — G629 Polyneuropathy, unspecified: Secondary | ICD-10-CM | POA: Diagnosis not present

## 2020-10-18 DIAGNOSIS — I739 Peripheral vascular disease, unspecified: Secondary | ICD-10-CM

## 2020-10-18 NOTE — Progress Notes (Signed)
Subjective: Peter Buck is a 65 y.o. male patient with history of diabetes who presents to office today complaining of long,mildly painful nails  while ambulating in shoes; unable to trim. Patient reports that his diabetic shoes seem to help with his mobility greatly however is having some pain in the leg and was diagnosed by a provider at his PCPs office with issues with varicose veins and is wondering if I can send in a referral to a vein specialist that the medication that was given seems to help but only for a little bit.  Denies any other pedal complaints at this time.  Patient Active Problem List   Diagnosis Date Noted  . Morbid obesity ([place]) 04/11/2020  . Anemia   . Arthritis   . Asthma   . Bipolar disorder ([place])   . Diabetes mellitus without complication ([place])   . GERD (gastroesophageal reflux disease)   . Hypertension   . Sleep apnea   . Paroxysmal atrial fibrillation ([place]) 12/23/2019  . Essential hypertension 11/02/2019  . Diabetes mellitus due to underlying condition with unspecified complications ([place]) 63/06/6008  . Chronic pain of left knee 10/19/2019  . Primary osteoarthritis of both first carpometacarpal joints 08/25/2019  . Second degree burn of finger of right hand 08/25/2019  . Verruca 08/25/2019  . Therapeutic opioid induced constipation 06/22/2019  . Cervical radiculopathy 02/02/2019  . Chronic pain syndrome 02/02/2019  . Facet arthropathy, cervical 02/02/2019  . Foraminal stenosis of cervical region 02/02/2019  . Neuroforaminal stenosis of lumbar spine 12/17/2018  . Pain medication agreement 10/16/2018  . Pain associated with accessory navicular bone of foot, left 05/25/2018  . Aftercare following surgery 04/23/2018  . Muscle spasm of back 02/17/2018  . DDD (degenerative disc disease), lumbar 11/25/2017  . Low back pain 11/25/2017  . Body mass index (BMI) 38.0-38.9, adult 09/16/2017  . Plantar fasciitis 01/02/2017  . Arthritis, midfoot 12/05/2016  .  Borderline personality disorder ([place]) 04/29/2016  . 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 Bipolar affective disorder, depressed ([place]) 02/13/2016  . Comprehensive diabetic foot examination, type 2 DM, encounter for ([place]) 12/25/2015  . Onychomycosis due to dermatophyte 12/25/2015  . Hammertoe 04/26/2014   Current Outpatient Medications on File Prior to Visit  Medication Sig Dispense Refill  . acetaminophen (TYLENOL) 650 MG CR tablet Take 650 mg by mouth every 8 (eight) hours as needed for pain.    Marland Kitchen albuterol (VENTOLIN HFA) 108 (90 Base) MCG/ACT inhaler Inhale 2 puffs into the lungs every 6 (six) hours as needed for wheezing or shortness of breath.    . ASHWAGANDHA PO Take 400 mg by mouth See admin instructions. Once every 10-14 days    . BYSTOLIC 10 MG tablet TAKE 1 TABLET BY MOUTH TWICE A DAY (Patient taking differently: Take 10 mg by mouth in the morning and at bedtime.) 180 tablet 2  . Calcium Citrate-Vitamin D (CALCIUM CITRATE + D3 PO) Take 1 tablet by mouth at bedtime.    . carboxymethylcellulose (REFRESH PLUS) 0.5 % SOLN Place 1 drop into both eyes 3 (three) times daily as needed (dry eyes).    . celecoxib (CELEBREX) 200 MG capsule Take 200 mg by mouth daily.    . chlorpheniramine (CHLOR-TRIMETON) 4 MG tablet Take 4 mg by mouth 2 (two) times daily as needed for allergies.    Marland Kitchen colchicine 0.6 MG tablet Take 0.6 mg by mouth daily as needed (gout).    Marland Kitchen diclofenac Sodium (VOLTAREN) 1 % GEL Apply 1 application topically 4 (four) times daily  as needed (pain).    Marland Kitchen dronedarone (MULTAQ) 400 MG tablet Take 1 tablet (400 mg total) by mouth 2 (two) times daily with a meal. 180 tablet 3  . ELIQUIS 5 MG TABS tablet Take 1 tablet (5 mg total) by mouth 2 (two) times daily. 180 tablet 3  . EPINEPHrine 0.3 mg/0.3 mL IJ SOAJ injection Inject 0.3 mg into the muscle as needed for anaphylaxis.    . Horse Chestnut 300 MG CAPS Take 300 mg by mouth in the morning and at bedtime.    Marland Kitchen HYDROcodone-acetaminophen  (NORCO/VICODIN) 5-325 MG tablet Take 1 tablet by mouth every 8 (eight) hours as needed for moderate pain.    Marland Kitchen lubiprostone (AMITIZA) 24 MCG capsule Take 24 mcg by mouth 2 (two) times daily with a meal.    . mometasone (NASONEX) 50 MCG/ACT nasal spray Place 1 spray into the nose in the morning and at bedtime.    Marland Kitchen MOVANTIK 25 MG TABS tablet Take 25 mg by mouth daily as needed (opioid induced constipation.).    Marland Kitchen Oxcarbazepine (TRILEPTAL) 300 MG tablet Take 300 mg by mouth 2 (two) times daily.    Marland Kitchen OZEMPIC, 1 MG/DOSE, 4 MG/3ML SOPN Inject 1 mg into the muscle every Saturday. INJECT THE CONTENTS OF 1 PEN INJECTOR SUBQ ONCE WEEKLY    . pregabalin (LYRICA) 200 MG capsule Take 200 mg by mouth 2 (two) times daily.    . rosuvastatin (CRESTOR) 20 MG tablet Take 1 tablet (20 mg total) by mouth daily. 90 tablet 3  . sildenafil (VIAGRA) 100 MG tablet Take 100 mg by mouth daily as needed for erectile dysfunction.    Marland Kitchen testosterone cypionate (DEPOTESTOSTERONE CYPIONATE) 200 MG/ML injection Inject 200 mg into the muscle every 21 ( twenty-one) days.    . valsartan (DIOVAN) 160 MG tablet Take 160 mg by mouth at bedtime.     No current facility-administered medications on file prior to visit.   Allergies  Allergen Reactions  . Augmentin [Amoxicillin-Pot Clavulanate] Anaphylaxis  . Ivp Dye [Iodinated Diagnostic Agents] Anaphylaxis  . Ketorolac Anaphylaxis  . Toradol [Ketorolac Tromethamine] Anaphylaxis    "It put me in shock.  My blood pressure tanked and I was itchy all over; had to go to the ER."  . Atorvastatin Other (See Comments)    Severe back ache Severe back ache Severe back ache Severe back ache  . Guaifenesin Other (See Comments) and Swelling    "It sorta made my throat close up." "It sorta made my throat close up." "It sorta made my throat close up."  . Penicillins Dermatitis  . Alendronate Sodium Other (See Comments)    Unknown reaction   . Dextromethorphan Other (See Comments)     Unknown reaction  . Pseudoephedrine Other (See Comments)    "It dries me out.  I take it when my other allergy medicine isn't working."    No results found for this or any previous visit (from the past 2160 hour(s)).  Objective: General: Patient is awake, alert, and oriented x 3 and in no acute distress.  Integument: Skin is warm, dry and supple bilateral. Nails are tender, long, thickened and dystrophic with subungual debris, consistent with onychomycosis, 1-5 bilateral. No signs of infection.  Dry skin bilateral.  No open lesions or preulcerative lesions present bilateral. Remaining integument unremarkable.  Vasculature:  Dorsalis Pedis pulse 1/4 bilateral. Posterior Tibial pulse  0/4 bilateral due to edema. Capillary fill time <5 sec 1-5 bilateral.  Scant positive hair growth to  the level of the digits.Temperature gradient within normal limits.  Mild varicosities present bilateral ankles with severe varicosities noted in upper thighs and legs.  1+ pitting edema noted bilateral.  Neurology: Protective sensation intact vibratory sensation diminished bilateral.  No Babinski sign present bilateral.   Musculoskeletal: Asymptomatic pes planus and hammertoe deformity noted bilateral. There is limited ankle joint range of motion bilateral consistent with equinus deformity.  No tenderness with calf compression bilateral.  Assessment and Plan: Problem List Items Addressed This Visit      Endocrine   Diabetes mellitus due to underlying condition with unspecified complications ([place])    Other Visit Diagnoses    PVD (peripheral vascular disease) ([place])    -  Primary   Relevant Orders   Ambulatory referral to Vascular Surgery   Neuropathy       Pain due to onychomycosis of toenails of both feet         -Examined patient. -Discussed and educated patient on diabetic foot care, especially with regards to the vascular, neurological and musculoskeletal systems.  -Stressed the importance of good  glycemic control and the detriment of not  controlling glucose levels in relation to the foot. -Mechanically debrided all nails 1-5 bilateral using sterile nail nipper and filed with dremel without incident  -Continue with diabetic shoes -Referral placed for vein and vascular for further recommendations for varicose veins however at this time encouraged elevation and compression and continue with medication management as given by provider at PCPs office -Patient to return  in 3 months for at risk foot care -Patient advised to call the office if any problems or questions arise in the meantime.  Landis Martins, DPM

## 2020-10-19 ENCOUNTER — Encounter: Payer: Self-pay | Admitting: Cardiovascular Disease

## 2020-10-19 ENCOUNTER — Ambulatory Visit: Payer: Medicare HMO | Admitting: Cardiovascular Disease

## 2020-10-19 VITALS — BP 112/68 | HR 70 | Ht 76.0 in | Wt 327.2 lb

## 2020-10-19 DIAGNOSIS — E782 Mixed hyperlipidemia: Secondary | ICD-10-CM | POA: Diagnosis not present

## 2020-10-19 DIAGNOSIS — I4819 Other persistent atrial fibrillation: Secondary | ICD-10-CM | POA: Diagnosis not present

## 2020-10-19 DIAGNOSIS — I10 Essential (primary) hypertension: Secondary | ICD-10-CM | POA: Diagnosis not present

## 2020-10-19 DIAGNOSIS — E669 Obesity, unspecified: Secondary | ICD-10-CM

## 2020-10-19 NOTE — Patient Instructions (Signed)
Medication Instructions:  The current medical regimen is effective;  continue present plan and medications.  *If you need a refill on your cardiac medications before your next appointment, please call your pharmacy*   Follow-Up: At [facility], you and your health needs are our priority.  As part of our continuing mission to provide you with exceptional heart care, we have created designated Provider Care Teams.  These Care Teams include your primary Cardiologist (physician) and Advanced Practice Providers (APPs -  Physician Assistants and Nurse Practitioners) who all work together to provide you with the care you need, when you need it.  We recommend signing up for the patient portal called "MyChart".  Sign up information is provided on this After Visit Summary.  MyChart is used to connect with patients for Virtual Visits (Telemedicine).  Patients are able to view lab/test results, encounter notes, upcoming appointments, etc.  Non-urgent messages can be sent to your provider as well.   To learn more about what you can do with MyChart, go to [email or web address].    Your next appointment:   6 month(s)  The format for your next appointment:   In Person  Provider:   You may see Dr.O'Neal or one of the following Advanced Practice Providers on your designated Care Team:    Almyra Deforest, PA-C  Fabian Sharp, PA-C or   Roby Lofts, [place]

## 2020-10-19 NOTE — Progress Notes (Signed)
Cardiology Office Note:   Date:  10/19/2020  NAME:  Peter Buck    MRN: [id number] DOB:  [birth year]   PCP:  [name], MD  Cardiologist:  None   Referring MD: [name], MD   Chief Complaint  Patient presents with  . Follow-up   History of Present Illness:   Peter Buck is a 65 y.o. male with a hx of persistent Afib s/p DCCV, obesity, OSA, HTN, HLD who presents for follow-up.  He is doing well.  He had recent right wrist surgery.  Recovered from that.  Back on Eliquis.  Maintaining sinus rhythm on dronedarone.  No A. fib episodes.  He did buy the cardia mobile device but has not taken it out of the box.  I have encouraged him to do this.  Nonetheless he is doing well.  Using his CPAP machine.  BP 112/68.  Denies any chest pain or shortness of breath today.  Weights have increased 7 pounds.  He reports he will get back on his diet.  All of his numbers look great.  Most recent LDL cholesterol 55.  Problem List 1. Persistent Atrial fibrillation -CHADSVASC=5(age, DM, HTN, TIA) -failed DCCV 10/2019 -MPI normal 10/2019 (breast attenuation) -DCCV 07/05/2020 -on dronedarone  2. Morbid obesity -BMI 39 3. OSA 4. HTN 5. DM -A1c 6.0 6. HLD -T chol 128, HDL 43, LDL 55, triglycerides 183  Past Medical History: Past Medical History:  Diagnosis Date  . Aftercare following surgery 04/23/2018  . Anemia   . Arthritis   . Arthritis, midfoot 12/05/2016  . Asthma   . Bipolar affective disorder, depressed ([employer]) 02/13/2016  . Bipolar disorder ([place])   . Body mass index (bmi) 38.0-38.9, adult 09/16/2017  . Body mass index (BMI) 38.0-38.9, adult 09/16/2017   Formatting of this note might be different from the original. IMO 10/01 Updates  . Borderline personality disorder ([place]) 04/29/2016  . Cervical radiculopathy 02/02/2019   Last Assessment & Plan:  Formatting of this note might be different from the original. MRI of the cervical spine shows cervical spondylosis with facet arthrosis  most notable for mild to moderate left neural foraminal stenosis at C3-4. Per patient, nerve conduction studies showed right C7-8 radiculopathy.   Continue with current regimen as outlined in lumbar degenerative disc plan.  . Chronic pain of left knee 10/19/2019  . Chronic pain syndrome 02/02/2019  . Comprehensive diabetic foot examination, type 2 DM, encounter for ([place]) 12/25/2015  . DDD (degenerative disc disease), lumbar 11/25/2017   Last Assessment & Plan:  Formatting of this note might be different from the original. 65 year old male with chronic, predominantly left-sided low back pain with radiation into the left hip and groin.  Today we reviewed his recently updated lumbar MRI which shows impingement of the left L2 nerve root due to broad-based disc bulging lateralizing to the left and projecting into the far left lateral   . Diabetes mellitus due to underlying condition with unspecified complications ([place]) 09/07/5571  . Diabetes mellitus without complication ([place])   . Essential hypertension 11/02/2019  . Facet arthropathy, cervical 02/02/2019  . Foraminal stenosis of cervical region 02/02/2019  . GERD (gastroesophageal reflux disease)   . Hammertoe 04/26/2014  . Hypertension   . 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 Low back pain 11/25/2017   Last Assessment & Plan:  Peter Buck is a 65 y.o. male with Low back pain, and acute on chronic low back pain with left hip, knee, and foot pain.  Today I will treat  with the patient's current medication of tramadol and diclofenac and add relafen (temporarily d/c diclofenac) for 10 days and increase lyrica. He will continue with his current appt with his neurosurgery.  Mr. Gromek will return to   . Muscle spasm of back 02/17/2018   Last Assessment & Plan:  Formatting of this note might be different from the original. Continue with tizanidine 4 mg as needed for muscle spasming up to 3 times daily.  . Neuroforaminal stenosis of lumbar spine 12/17/2018  . Onychomycosis  due to dermatophyte 12/25/2015  . Pain associated with accessory navicular bone of foot, left 05/25/2018  . Pain medication agreement 10/16/2018   Last Assessment & Plan:  Formatting of this note might be different from the original. Review of prior UDS results are within normal limits.  [place] controlled substance registry reviewed and there were no inconsistencies noted.  . Paroxysmal atrial fibrillation ([place]) 12/23/2019  . Plantar fasciitis 01/02/2017  . Primary osteoarthritis of both first carpometacarpal joints 08/25/2019  . Second degree burn of finger of right hand 08/25/2019  . Sleep apnea   . Therapeutic opioid induced constipation 06/22/2019   Last Assessment & Plan:  Formatting of this note might be different from the original. Continue amitiza 4mcg b.i.d.  . Verruca 08/25/2019    Past Surgical History: Past Surgical History:  Procedure Laterality Date  . APPENDECTOMY    . CARDIOVERSION N/A 07/05/2020   Procedure: CARDIOVERSION;  Surgeon: [name], MD;  Location: [facility];  Service: Cardiovascular;  Laterality: N/A;  . CHOLECYSTECTOMY    . INNER EAR SURGERY    . KNEE ARTHROSCOPY      Current Medications: Current Meds  Medication Sig  . acetaminophen (TYLENOL) 650 MG CR tablet Take 650 mg by mouth every 8 (eight) hours as needed for pain.  Marland Kitchen albuterol (VENTOLIN HFA) 108 (90 Base) MCG/ACT inhaler Inhale 2 puffs into the lungs every 6 (six) hours as needed for wheezing or shortness of breath.  . BYSTOLIC 10 MG tablet TAKE 1 TABLET BY MOUTH TWICE A DAY (Patient taking differently: Take 10 mg by mouth in the morning and at bedtime.)  . Calcium Citrate-Vitamin D (CALCIUM CITRATE + D3 PO) Take 1 tablet by mouth at bedtime.  . celecoxib (CELEBREX) 200 MG capsule Take 200 mg by mouth daily.  . chlorpheniramine (CHLOR-TRIMETON) 4 MG tablet Take 4 mg by mouth 2 (two) times daily as needed for allergies.  Marland Kitchen diclofenac Sodium (VOLTAREN) 1 % GEL Apply 1 application  topically 4 (four) times daily as needed (pain).  Marland Kitchen dronedarone (MULTAQ) 400 MG tablet Take 1 tablet (400 mg total) by mouth 2 (two) times daily with a meal.  . ELIQUIS 5 MG TABS tablet Take 1 tablet (5 mg total) by mouth 2 (two) times daily.  Marland Kitchen EPINEPHrine 0.3 mg/0.3 mL IJ SOAJ injection Inject 0.3 mg into the muscle as needed for anaphylaxis.  . Horse Chestnut 300 MG CAPS Take 300 mg by mouth in the morning and at bedtime.  Marland Kitchen HYDROcodone-acetaminophen (NORCO/VICODIN) 5-325 MG tablet Take 1 tablet by mouth every 8 (eight) hours as needed for moderate pain.  Marland Kitchen lubiprostone (AMITIZA) 24 MCG capsule Take 24 mcg by mouth 2 (two) times daily with a meal.  . mometasone (NASONEX) 50 MCG/ACT nasal spray Place 1 spray into the nose in the morning and at bedtime.  . Oxcarbazepine (TRILEPTAL) 300 MG tablet Take 300 mg by mouth 2 (two) times daily.  Marland Kitchen OZEMPIC, 1 MG/DOSE, 4 MG/3ML SOPN  Inject 1 mg into the muscle every Saturday. INJECT THE CONTENTS OF 1 PEN INJECTOR SUBQ ONCE WEEKLY  . pregabalin (LYRICA) 200 MG capsule Take 200 mg by mouth 2 (two) times daily.  Marland Kitchen RELISTOR 150 MG TABS Take 3 tablets by mouth daily.  . rosuvastatin (CRESTOR) 20 MG tablet Take 1 tablet (20 mg total) by mouth daily.  . sildenafil (VIAGRA) 100 MG tablet Take 100 mg by mouth daily as needed for erectile dysfunction.  Marland Kitchen testosterone cypionate (DEPOTESTOSTERONE CYPIONATE) 200 MG/ML injection Inject 200 mg into the muscle every 21 ( twenty-one) days.  . valsartan (DIOVAN) 160 MG tablet Take 160 mg by mouth at bedtime.     Allergies:    Augmentin [amoxicillin-pot clavulanate], Ivp dye [iodinated diagnostic agents], Ketorolac, Toradol [ketorolac tromethamine], Atorvastatin, Guaifenesin, Penicillins, Alendronate sodium, Dextromethorphan, and Pseudoephedrine   Social History: Social History   Socioeconomic History  . Marital status: Divorced    Spouse name: Not on file  . Number of children: Not on file  . Years of education:  Not on file  . Highest education level: Not on file  Occupational History  . Occupation: retired  Tobacco Use  . Smoking status: Never Smoker  . Smokeless tobacco: Never Used  Vaping Use  . Vaping Use: Never used  Substance and Sexual Activity  . Alcohol use: No  . Drug use: No  . Sexual activity: Not on file  Other Topics Concern  . Not on file  Social History Narrative  . Not on file   Social Determinants of Health   Financial Resource Strain: Not on file  Food Insecurity: Not on file  Transportation Needs: Not on file  Physical Activity: Not on file  Stress: Not on file  Social Connections: Not on file     Family History: The patient's family history includes Macular degeneration in his mother; Thyroid disease in his mother.  ROS:   All other ROS reviewed and negative. Pertinent positives noted in the HPI.     EKGs/Labs/Other Studies Reviewed:   The following studies were personally reviewed by me today:  NM Stress 10/25/2019  -anteroseptal infarct without ischemia, EF 53%  Echo 10/23/2019 EF 55-60%  Recent Labs: 06/29/2020: BUN 30; Creatinine, Ser 1.23; Hemoglobin 17.6; Platelets 249; Potassium 5.0; Sodium 140   Recent Lipid Panel No results found for: CHOL, TRIG, HDL, CHOLHDL, VLDL, LDLCALC, LDLDIRECT  Physical Exam:   VS:  BP 112/68   Pulse 70   Ht 6\' 4"  (1.93 m)   Wt (!) 327 lb 3.2 oz (148.4 kg)   SpO2 96%   BMI 39.83 kg/m    Wt Readings from Last 3 Encounters:  10/19/20 (!) 327 lb 3.2 oz (148.4 kg)  07/27/20 (!) 320 lb (145.2 kg)  06/29/20 (!) 317 lb (143.8 kg)    General: Well nourished, well developed, in no acute distress Head: Atraumatic, normal size  Eyes: PEERLA, EOMI  Neck: Supple, no JVD Endocrine: No thryomegaly Cardiac: Normal S1, S2; RRR; no murmurs, rubs, or gallops Lungs: Clear to auscultation bilaterally, no wheezing, rhonchi or rales  Abd: Soft, nontender, no hepatomegaly  Ext: No edema, pulses 2+ Musculoskeletal: No  deformities, BUE and BLE strength normal and equal Skin: Warm and dry, no rashes   Neuro: Alert and oriented to person, place, time, and situation, CNII-XII grossly intact, no focal deficits  Psych: Normal mood and affect   ASSESSMENT:   Arlene Genova is a 65 y.o. male who presents for the following: 1. Persistent atrial  fibrillation ([place])   2. Primary hypertension   3. Mixed hyperlipidemia   4. Obesity (BMI 30-39.9)     PLAN:   1. Persistent atrial fibrillation ([place]) -Diagnosed with atrial fibrillation.  Failed cardioversion in May 2021.  Repeat cardioversion in January 2022 with success.  Maintaining sinus rhythm on dronedarone 400 mg BID.  No symptoms from this.  Would recommend to continue with this. -On Eliquis 5 mg twice daily.  Denies any symptoms in office today.  Doing well. -EF is normal.  Nuclear medicine stress test likely normal.  2. Primary hypertension -Well-controlled on nebivolol as well as valsartan.  No change in medications.  3. Mixed hyperlipidemia -Most recent LDL cholesterol 55.  We will continue Crestor 20 mg daily.  4. Obesity (BMI 30-39.9) -Diet and exercise recommended.  Disposition: Return in about 6 months (around 04/21/2021).  Medication Adjustments/Labs and Tests Ordered: Current medicines are reviewed at length with the patient today.  Concerns regarding medicines are outlined above.  No orders of the defined types were placed in this encounter.  No orders of the defined types were placed in this encounter.   Patient Instructions  Medication Instructions:  The current medical regimen is effective;  continue present plan and medications.  *If you need a refill on your cardiac medications before your next appointment, please call your pharmacy*   Follow-Up: At [facility], you and your health needs are our priority.  As part of our continuing mission to provide you with exceptional heart care, we have created designated Provider Care Teams.   These Care Teams include your primary Cardiologist (physician) and Advanced Practice Providers (APPs -  Physician Assistants and Nurse Practitioners) who all work together to provide you with the care you need, when you need it.  We recommend signing up for the patient portal called "MyChart".  Sign up information is provided on this After Visit Summary.  MyChart is used to connect with patients for Virtual Visits (Telemedicine).  Patients are able to view lab/test results, encounter notes, upcoming appointments, etc.  Non-urgent messages can be sent to your provider as well.   To learn more about what you can do with MyChart, go to [email or web address].    Your next appointment:   6 month(s)  The format for your next appointment:   In Person  Provider:   You may see Dr.O'Neal or one of the following Advanced Practice Providers on your designated Care Team:    Almyra Deforest, PA-C  Fabian Sharp, [place] or   Roby Lofts, PA-C        Time Spent with Patient: I have spent a total of 25 minutes with patient reviewing hospital notes, telemetry, EKGs, labs and examining the patient as well as establishing an assessment and plan that was discussed with the patient.  > 50% of time was spent in direct patient care.  Signed, Addison Naegeli. [name], MD, [place]  [address], [place], [place] [address] [phone]  10/19/2020 10:15 AM

## 2020-10-20 DIAGNOSIS — Z6841 Body Mass Index (BMI) 40.0 and over, adult: Secondary | ICD-10-CM | POA: Diagnosis not present

## 2020-10-20 DIAGNOSIS — I872 Venous insufficiency (chronic) (peripheral): Secondary | ICD-10-CM | POA: Diagnosis not present

## 2020-10-27 DIAGNOSIS — S80862A Insect bite (nonvenomous), left lower leg, initial encounter: Secondary | ICD-10-CM | POA: Diagnosis not present

## 2020-10-27 DIAGNOSIS — J309 Allergic rhinitis, unspecified: Secondary | ICD-10-CM | POA: Diagnosis not present

## 2020-10-30 DIAGNOSIS — R29898 Other symptoms and signs involving the musculoskeletal system: Secondary | ICD-10-CM | POA: Diagnosis not present

## 2020-10-30 DIAGNOSIS — Q666 Other congenital valgus deformities of feet: Secondary | ICD-10-CM | POA: Diagnosis not present

## 2020-10-30 DIAGNOSIS — Z96691 Finger-joint replacement of right hand: Secondary | ICD-10-CM | POA: Diagnosis not present

## 2020-10-30 DIAGNOSIS — M79672 Pain in left foot: Secondary | ICD-10-CM | POA: Diagnosis not present

## 2020-10-30 DIAGNOSIS — M19072 Primary osteoarthritis, left ankle and foot: Secondary | ICD-10-CM | POA: Diagnosis not present

## 2020-10-30 DIAGNOSIS — M7989 Other specified soft tissue disorders: Secondary | ICD-10-CM | POA: Diagnosis not present

## 2020-10-30 DIAGNOSIS — M76822 Posterior tibial tendinitis, left leg: Secondary | ICD-10-CM | POA: Diagnosis not present

## 2020-10-30 DIAGNOSIS — E11610 Type 2 diabetes mellitus with diabetic neuropathic arthropathy: Secondary | ICD-10-CM | POA: Diagnosis not present

## 2020-10-30 DIAGNOSIS — M25572 Pain in left ankle and joints of left foot: Secondary | ICD-10-CM | POA: Diagnosis not present

## 2020-10-30 DIAGNOSIS — G63 Polyneuropathy in diseases classified elsewhere: Secondary | ICD-10-CM | POA: Diagnosis not present

## 2020-10-30 DIAGNOSIS — E118 Type 2 diabetes mellitus with unspecified complications: Secondary | ICD-10-CM | POA: Diagnosis not present

## 2020-10-31 ENCOUNTER — Ambulatory Visit: Payer: Medicare HMO | Admitting: Sports Medicine

## 2020-10-31 DIAGNOSIS — E1142 Type 2 diabetes mellitus with diabetic polyneuropathy: Secondary | ICD-10-CM | POA: Diagnosis not present

## 2020-10-31 DIAGNOSIS — M5136 Other intervertebral disc degeneration, lumbar region: Secondary | ICD-10-CM | POA: Diagnosis not present

## 2020-10-31 DIAGNOSIS — M62830 Muscle spasm of back: Secondary | ICD-10-CM | POA: Diagnosis not present

## 2020-10-31 DIAGNOSIS — Z79891 Long term (current) use of opiate analgesic: Secondary | ICD-10-CM | POA: Diagnosis not present

## 2020-10-31 DIAGNOSIS — K5903 Drug induced constipation: Secondary | ICD-10-CM | POA: Diagnosis not present

## 2020-10-31 DIAGNOSIS — T402X5A Adverse effect of other opioids, initial encounter: Secondary | ICD-10-CM | POA: Diagnosis not present

## 2020-11-01 DIAGNOSIS — F319 Bipolar disorder, unspecified: Secondary | ICD-10-CM | POA: Diagnosis not present

## 2020-11-01 DIAGNOSIS — Z6839 Body mass index (BMI) 39.0-39.9, adult: Secondary | ICD-10-CM | POA: Diagnosis not present

## 2020-11-01 DIAGNOSIS — R69 Illness, unspecified: Secondary | ICD-10-CM | POA: Diagnosis not present

## 2020-11-08 ENCOUNTER — Encounter: Payer: Self-pay | Admitting: Sports Medicine

## 2020-11-08 NOTE — Progress Notes (Signed)
Patient brought in prescription paperwork from [facility].  I completed the paperwork detailing that patient may benefit from custom functional orthotics and AFO for her complaint of ankle instability and pes planus.  Patient is not a candidate for another pair of shoes because he just received a pair last year however may see [facility] clinic for orthotics and bracing. -Dr. Cannon Kettle

## 2020-11-15 DIAGNOSIS — E1159 Type 2 diabetes mellitus with other circulatory complications: Secondary | ICD-10-CM | POA: Diagnosis not present

## 2020-11-15 DIAGNOSIS — R69 Illness, unspecified: Secondary | ICD-10-CM | POA: Diagnosis not present

## 2020-11-15 DIAGNOSIS — E782 Mixed hyperlipidemia: Secondary | ICD-10-CM | POA: Diagnosis not present

## 2020-11-16 DIAGNOSIS — K5909 Other constipation: Secondary | ICD-10-CM | POA: Diagnosis not present

## 2020-11-16 DIAGNOSIS — E669 Obesity, unspecified: Secondary | ICD-10-CM | POA: Diagnosis not present

## 2020-11-16 DIAGNOSIS — Z139 Encounter for screening, unspecified: Secondary | ICD-10-CM | POA: Diagnosis not present

## 2020-11-16 DIAGNOSIS — Z9109 Other allergy status, other than to drugs and biological substances: Secondary | ICD-10-CM | POA: Diagnosis not present

## 2020-11-16 DIAGNOSIS — Z136 Encounter for screening for cardiovascular disorders: Secondary | ICD-10-CM | POA: Diagnosis not present

## 2020-11-16 DIAGNOSIS — Z6839 Body mass index (BMI) 39.0-39.9, adult: Secondary | ICD-10-CM | POA: Diagnosis not present

## 2020-11-16 DIAGNOSIS — Z1339 Encounter for screening examination for other mental health and behavioral disorders: Secondary | ICD-10-CM | POA: Diagnosis not present

## 2020-11-16 DIAGNOSIS — E1140 Type 2 diabetes mellitus with diabetic neuropathy, unspecified: Secondary | ICD-10-CM | POA: Diagnosis not present

## 2020-11-16 DIAGNOSIS — Z0000 Encounter for general adult medical examination without abnormal findings: Secondary | ICD-10-CM | POA: Diagnosis not present

## 2020-11-16 DIAGNOSIS — Z1331 Encounter for screening for depression: Secondary | ICD-10-CM | POA: Diagnosis not present

## 2020-11-21 ENCOUNTER — Telehealth: Payer: Self-pay

## 2020-11-21 NOTE — Telephone Encounter (Signed)
Pt called stating he has not received a called from his referral to vein and vascular

## 2020-11-22 DIAGNOSIS — G63 Polyneuropathy in diseases classified elsewhere: Secondary | ICD-10-CM | POA: Diagnosis not present

## 2020-11-22 DIAGNOSIS — M19072 Primary osteoarthritis, left ankle and foot: Secondary | ICD-10-CM | POA: Diagnosis not present

## 2020-11-22 DIAGNOSIS — E118 Type 2 diabetes mellitus with unspecified complications: Secondary | ICD-10-CM | POA: Diagnosis not present

## 2020-11-22 DIAGNOSIS — M2142 Flat foot [pes planus] (acquired), left foot: Secondary | ICD-10-CM | POA: Diagnosis not present

## 2020-11-22 DIAGNOSIS — M14672 Charcot's joint, left ankle and foot: Secondary | ICD-10-CM | POA: Diagnosis not present

## 2020-11-24 ENCOUNTER — Other Ambulatory Visit: Payer: Self-pay

## 2020-11-24 DIAGNOSIS — M19049 Primary osteoarthritis, unspecified hand: Secondary | ICD-10-CM | POA: Insufficient documentation

## 2020-11-24 DIAGNOSIS — M79641 Pain in right hand: Secondary | ICD-10-CM | POA: Insufficient documentation

## 2020-11-24 DIAGNOSIS — I739 Peripheral vascular disease, unspecified: Secondary | ICD-10-CM

## 2020-11-29 DIAGNOSIS — K5904 Chronic idiopathic constipation: Secondary | ICD-10-CM | POA: Diagnosis not present

## 2020-11-30 DIAGNOSIS — E119 Type 2 diabetes mellitus without complications: Secondary | ICD-10-CM | POA: Diagnosis not present

## 2020-11-30 DIAGNOSIS — Z9989 Dependence on other enabling machines and devices: Secondary | ICD-10-CM | POA: Diagnosis not present

## 2020-11-30 DIAGNOSIS — Z6838 Body mass index (BMI) 38.0-38.9, adult: Secondary | ICD-10-CM | POA: Diagnosis not present

## 2020-11-30 DIAGNOSIS — Z6841 Body Mass Index (BMI) 40.0 and over, adult: Secondary | ICD-10-CM | POA: Diagnosis not present

## 2020-11-30 DIAGNOSIS — G4733 Obstructive sleep apnea (adult) (pediatric): Secondary | ICD-10-CM | POA: Diagnosis not present

## 2020-12-05 DIAGNOSIS — R69 Illness, unspecified: Secondary | ICD-10-CM | POA: Diagnosis not present

## 2020-12-07 DIAGNOSIS — E291 Testicular hypofunction: Secondary | ICD-10-CM | POA: Diagnosis not present

## 2020-12-07 DIAGNOSIS — Z79899 Other long term (current) drug therapy: Secondary | ICD-10-CM | POA: Diagnosis not present

## 2020-12-07 DIAGNOSIS — Z125 Encounter for screening for malignant neoplasm of prostate: Secondary | ICD-10-CM | POA: Diagnosis not present

## 2020-12-07 DIAGNOSIS — N401 Enlarged prostate with lower urinary tract symptoms: Secondary | ICD-10-CM | POA: Diagnosis not present

## 2020-12-13 DIAGNOSIS — H26493 Other secondary cataract, bilateral: Secondary | ICD-10-CM | POA: Diagnosis not present

## 2020-12-13 DIAGNOSIS — Z961 Presence of intraocular lens: Secondary | ICD-10-CM | POA: Diagnosis not present

## 2020-12-15 DIAGNOSIS — E782 Mixed hyperlipidemia: Secondary | ICD-10-CM | POA: Diagnosis not present

## 2020-12-15 DIAGNOSIS — R69 Illness, unspecified: Secondary | ICD-10-CM | POA: Diagnosis not present

## 2020-12-15 DIAGNOSIS — E1159 Type 2 diabetes mellitus with other circulatory complications: Secondary | ICD-10-CM | POA: Diagnosis not present

## 2020-12-19 ENCOUNTER — Encounter: Payer: Self-pay | Admitting: Vascular Surgery

## 2020-12-19 ENCOUNTER — Ambulatory Visit (INDEPENDENT_AMBULATORY_CARE_PROVIDER_SITE_OTHER): Payer: Medicare HMO | Admitting: Vascular Surgery

## 2020-12-19 ENCOUNTER — Other Ambulatory Visit: Payer: Self-pay | Admitting: Vascular Surgery

## 2020-12-19 ENCOUNTER — Ambulatory Visit (HOSPITAL_COMMUNITY)
Admission: RE | Admit: 2020-12-19 | Discharge: 2020-12-19 | Disposition: A | Discharge status: Home / Self Care | Payer: Medicare HMO | Source: Ambulatory Visit | Attending: Vascular Surgery | Admitting: Vascular Surgery

## 2020-12-19 ENCOUNTER — Other Ambulatory Visit: Payer: Self-pay

## 2020-12-19 DIAGNOSIS — I872 Venous insufficiency (chronic) (peripheral): Secondary | ICD-10-CM

## 2020-12-19 DIAGNOSIS — I739 Peripheral vascular disease, unspecified: Secondary | ICD-10-CM

## 2020-12-19 DIAGNOSIS — M7989 Other specified soft tissue disorders: Secondary | ICD-10-CM

## 2020-12-19 NOTE — Progress Notes (Signed)
Patient name: Peter Buck MRN: [id number] DOB: Jun 15, 1956 Sex: male  REASON FOR CONSULT: Evaluate venous insufficiency and varicose veins in lower extremity  HPI: Peter Buck is a 65 y.o. male, with history of obesity, diabetes, hypertension, atrial fibrillation that presents for evaluation of varicose veins and possible underlying venous insufficiency.  The referral states evaluate PVD, but the patient clearly understands that he is here for varicose vein evaluation.  He states that he told his PCP recently about his left ankle hurting and was told this was related to varicose veins adjacent to the area.  He was then started on horse chestnut extract and states all of his symptoms have improved.  He is not currently wearing compression and states he was told this could be painful.  He does not have any known history of DVT.  He has had leg swelling for decades that he states is very tolerable.  He works doing [employer] in [employer].  Past Medical History:  Diagnosis Date   Aftercare following surgery 04/23/2018   Anemia    Arthritis    Arthritis, midfoot 12/05/2016   Asthma    Bipolar affective disorder, depressed ([place]) 02/13/2016   Bipolar disorder ([place])    Body mass index (bmi) 38.0-38.9, adult 09/16/2017   Body mass index (BMI) 38.0-38.9, adult 09/16/2017   Formatting of this note might be different from the original. IMO 10/01 Updates   Borderline personality disorder ([place]) 04/29/2016   Cervical radiculopathy 02/02/2019   Last Assessment & Plan:  Formatting of this note might be different from the original. MRI of the cervical spine shows cervical spondylosis with facet arthrosis most notable for mild to moderate left neural foraminal stenosis at C3-4.  Per patient, nerve conduction studies showed right C7-8 radiculopathy.    Continue with current regimen as outlined in lumbar degenerative disc plan.   Chronic pain of left knee 10/19/2019   Chronic pain syndrome 02/02/2019   Comprehensive  diabetic foot examination, type 2 DM, encounter for ([place]) 12/25/2015   DDD (degenerative disc disease), lumbar 11/25/2017   Last Assessment & Plan:  Formatting of this note might be different from the original. 65 year old male with chronic, predominantly left-sided low back pain with radiation into the left hip and groin.  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 male with Low back pain, and acute on chronic low back pain with left hip, knee, and foot pain.  Today I will treat with the patient's current medication of tramadol and diclofenac and add relafen (temporarily d/c diclofenac) for 10 days and increase lyrica. He will continue with his current appt with his neurosurgery.  Mr. Heidinger will return to    Muscle spasm of back 02/17/2018   Last Assessment & Plan:  Formatting of this note might be different from the original. Continue with tizanidine 4 mg as needed for muscle spasming up to 3  times daily.   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 Review of prior UDS results are within normal limits.  [place] controlled substance registry  reviewed and there were no inconsistencies noted.   Paroxysmal atrial fibrillation ([place]) 12/23/2019   Plantar fasciitis 01/02/2017   Primary osteoarthritis of both first carpometacarpal joints 08/25/2019   Second degree burn of finger of right hand 08/25/2019   Sleep apnea    Therapeutic opioid induced constipation 06/22/2019   Last Assessment & Plan:  Formatting of this note might be different from the original. Continue amitiza 81mcg b.i.d.   [place] 08/25/2019    Past Surgical History:  Procedure Laterality Date   APPENDECTOMY     CARDIOVERSION N/A 07/05/2020   Procedure: CARDIOVERSION;  Surgeon: [name], MD;  Location: [facility] ENDOSCOPY;  Service: Cardiovascular;  Laterality: N/A;   CHOLECYSTECTOMY     INNER EAR SURGERY     KNEE ARTHROSCOPY      Family History  Problem Relation Age of Onset   Thyroid disease Mother    Macular degeneration Mother     SOCIAL HISTORY: Social History   Socioeconomic History   Marital status: Divorced    Spouse name: Not on file   Number of children: Not on file   Years of education: Not on file   Highest education level: Not on file  Occupational History   Occupation: retired  Tobacco Use   Smoking status: Never   Smokeless tobacco: Never  Vaping Use   Vaping Use: Never used  Substance and Sexual Activity   Alcohol use: No   Drug use: No   Sexual activity: Not on file  Other Topics Concern   Not on file  Social History Narrative   Not on file   Social Determinants of Health   Financial Resource Strain: Not on file  Food Insecurity: Not on file  Transportation Needs: Not on file  Physical Activity: Not on file  Stress: Not on file  Social Connections: Not on file  Intimate Partner Violence: Not on file    Allergies  Allergen Reactions   Augmentin [Amoxicillin-Pot Clavulanate] Anaphylaxis   Ivp Dye [Iodinated Diagnostic Agents] Anaphylaxis   Ketorolac Anaphylaxis   Toradol [Ketorolac Tromethamine] Anaphylaxis    "It  put me in shock.  My blood pressure tanked and I was itchy all over; had to go to the ER."   Atorvastatin Other (See Comments)    Severe back ache Severe back ache Severe back ache Severe back ache   Guaifenesin Other (See Comments) and Swelling    "It sorta made my throat close up." "It sorta made my throat close up." "It sorta made my throat close up."   Penicillins Dermatitis   Alendronate Sodium Other (See Comments)    Unknown reaction    Dextromethorphan Other (See Comments)    Unknown reaction   Pseudoephedrine Other (See Comments)    "It dries me out.  I take it when my other allergy medicine isn't working."    Current Outpatient Medications  Medication Sig Dispense Refill   acetaminophen (TYLENOL) 650 MG CR tablet Take 650 mg by mouth every 8 (eight) hours as needed for pain.     BYSTOLIC 10 MG tablet TAKE 1 TABLET BY MOUTH  TWICE A DAY (Patient taking differently: Take 10 mg by mouth in the morning and at bedtime.) 180 tablet 2   Calcium Citrate-Vitamin D (CALCIUM CITRATE + D3 PO) Take 1 tablet by mouth at bedtime.     celecoxib (CELEBREX) 200 MG capsule Take 200 mg by mouth daily.     diclofenac Sodium (VOLTAREN) 1 % GEL Apply 1 application topically 4 (four) times daily as needed (pain).     dronedarone (MULTAQ) 400 MG tablet Take 1 tablet (400 mg total) by mouth 2 (two) times daily with a meal. 180 tablet 3   ELIQUIS 5 MG TABS tablet Take 1 tablet (5 mg total) by mouth 2 (two) times daily. 180 tablet 3   Horse Chestnut 300 MG CAPS Take 300 mg by mouth in the morning and at bedtime.     HYDROcodone-acetaminophen (NORCO/VICODIN) 5-325 MG tablet Take 1 tablet by mouth every 8 (eight) hours as needed for moderate pain.     lubiprostone (AMITIZA) 24 MCG capsule Take 24 mcg by mouth 2 (two) times daily with a meal.     mometasone (NASONEX) 50 MCG/ACT nasal spray Place 1 spray into the nose in the morning and at bedtime.     Oxcarbazepine (TRILEPTAL) 300 MG tablet Take 300 mg by  mouth 2 (two) times daily.     OZEMPIC, 1 MG/DOSE, 4 MG/3ML SOPN Inject 1 mg into the muscle every Saturday. INJECT THE CONTENTS OF 1 PEN INJECTOR SUBQ ONCE WEEKLY     pregabalin (LYRICA) 200 MG capsule Take 200 mg by mouth 2 (two) times daily.     RELISTOR 150 MG TABS Take 3 tablets by mouth daily.     sildenafil (VIAGRA) 100 MG tablet Take 100 mg by mouth daily as needed for erectile dysfunction.     testosterone cypionate (DEPOTESTOSTERONE CYPIONATE) 200 MG/ML injection Inject 200 mg into the muscle every 21 ( twenty-one) days.     valsartan (DIOVAN) 160 MG tablet Take 160 mg by mouth at bedtime.     albuterol (VENTOLIN HFA) 108 (90 Base) MCG/ACT inhaler Inhale 2 puffs into the lungs every 6 (six) hours as needed for wheezing or shortness of breath. (Patient not taking: Reported on 12/19/2020)     chlorpheniramine (CHLOR-TRIMETON) 4 MG tablet Take 4 mg by mouth 2 (two) times daily as needed for allergies. (Patient not taking: Reported on 12/19/2020)     EPINEPHrine 0.3 mg/0.3 mL IJ SOAJ injection Inject 0.3 mg into the muscle as needed for anaphylaxis. (Patient not taking: Reported on 12/19/2020)     rosuvastatin (CRESTOR) 20 MG tablet Take 1 tablet (20 mg total) by mouth daily. 90 tablet 3   No current facility-administered medications for this visit.    REVIEW OF SYSTEMS:  [X]  denotes positive finding, [ ]  denotes negative finding Cardiac  Comments:  Chest pain or chest pressure:    Shortness of breath upon exertion:    Short of breath when lying flat:    Irregular heart rhythm:        Vascular    Pain in calf, thigh, or hip brought on by ambulation:    Pain in feet at night that wakes you up from your sleep:     Blood clot in your veins:    Leg swelling:  x Bilateral      Pulmonary    Oxygen at home:    Productive cough:     Wheezing:         Neurologic    Sudden  weakness in arms or legs:     Sudden numbness in arms or legs:     Sudden onset of difficulty speaking or slurred  speech:    Temporary loss of vision in one eye:     Problems with dizziness:         Gastrointestinal    Blood in stool:     Vomited blood:         Genitourinary    Burning when urinating:     Blood in urine:        Psychiatric    Major depression:         Hematologic    Bleeding problems:    Problems with blood clotting too easily:        Skin    Rashes or ulcers:        Constitutional    Fever or chills:      PHYSICAL EXAM: Vitals:   12/19/20 1249  BP: 118/70  Pulse: 69  Resp: 18  Temp: 97.6 F (36.4 C)  TempSrc: Temporal  SpO2: 94%  Weight: (!) 329 lb (149.2 kg)  Height: 6\' 4"  (1.93 m)    GENERAL: The patient is a well-nourished male, in no acute distress. The vital signs are documented above. CARDIAC: There is a regular rate and rhythm.  VASCULAR:  Palpable DP and PT pulses bilaterally Notable bilateral leg swelling, no skin thickening or open ulcerations PULMONARY: No respiratory distress. ABDOMEN: Soft and non-tender. MUSCULOSKELETAL: There are no major deformities or cyanosis. NEUROLOGIC: No focal weakness or paresthesias are detected. SKIN: There are no ulcers or rashes noted. PSYCHIATRIC: The patient has a normal affect.  DATA:   Lower Venous Reflux Study   Patient Name:  Peter Buck  Date of Exam:   12/19/2020  Medical Rec #: [id number]      Accession #:    [id number]  Date of Birth: 07/02/1955     Patient Gender: M  Patient Age:   64Y  Exam Location:  [facility]  Procedure:      VAS [place] LOWER EXTREMITY VENOUS REFLUX  Referring Phys: [id number] [place]    ---------------------------------------------------------------------------  -----     Indications: Varicosities.     Performing Technologist: Ralene Cork RVT      Examination Guidelines: A complete evaluation includes B-mode imaging,  spectral  Doppler, color Doppler, and power Doppler as needed of all accessible  portions  of each vessel. Bilateral  testing is considered an integral part of a  complete  examination. Limited examinations for reoccurring indications may be  performed  as noted. The reflux portion of the exam is performed with the patient in  reverse Trendelenburg.  Significant venous reflux is defined as >500 ms in the superficial venous  system, and >1 second in the deep venous system.      Venous Reflux Times  +--------------+---------+------+-----------+------------+-------------+  RIGHT         Reflux NoRefluxReflux TimeDiameter cmsComments                               Yes                                        +--------------+---------+------+-----------+------------+-------------+  CFV  yes   >1 second                            +--------------+---------+------+-----------+------------+-------------+  FV mid        no                                                   +--------------+---------+------+-----------+------------+-------------+  Popliteal               yes   >1 second                            +--------------+---------+------+-----------+------------+-------------+  GSV at [facility]              yes    >500 ms     0.825                   +--------------+---------+------+-----------+------------+-------------+  GSV prox thigh                             0.705                   +--------------+---------+------+-----------+------------+-------------+  GSV mid thigh           yes    >500 ms      0.54    out of fascia  +--------------+---------+------+-----------+------------+-------------+  GSV dist thigh          yes    >500 ms     0.537                   +--------------+---------+------+-----------+------------+-------------+  GSV at knee             yes    >500 ms     0.727                   +--------------+---------+------+-----------+------------+-------------+  GSV prox calf           yes    >500 ms     0.419                    +--------------+---------+------+-----------+------------+-------------+  SSV Pop Fossa no                           0.589                   +--------------+---------+------+-----------+------------+-------------+  SSV prox calf no                           0.425                   +--------------+---------+------+-----------+------------+-------------+  SSV mid calf  no                           0.414                   +--------------+---------+------+-----------+------------+-------------+      +--------------+---------+------+-----------+------------+--------+  LEFT          Reflux NoRefluxReflux TimeDiameter cmsComments  Yes                                   +--------------+---------+------+-----------+------------+--------+  CFV                     yes   >1 second                       +--------------+---------+------+-----------+------------+--------+  FV mid                  yes   >1 second                       +--------------+---------+------+-----------+------------+--------+  Popliteal     no                                              +--------------+---------+------+-----------+------------+--------+  GSV at SFJ              yes    >500 ms      1.02              +--------------+---------+------+-----------+------------+--------+  GSV prox thighno                            0.66              +--------------+---------+------+-----------+------------+--------+  GSV mid thigh no                           0.494              +--------------+---------+------+-----------+------------+--------+  GSV dist thighno                           0.508              +--------------+---------+------+-----------+------------+--------+  GSV at knee   no                           0.432              +--------------+---------+------+-----------+------------+--------+   GSV prox calf no                           0.341              +--------------+---------+------+-----------+------------+--------+  SSV Pop Fossa           yes    >500 ms     0.743              +--------------+---------+------+-----------+------------+--------+  SSV prox calf no                           0.594              +--------------+---------+------+-----------+------------+--------+  SSV mid calf  no                           0.514              +--------------+---------+------+-----------+------------+--------+  Summary:  Right:  - No evidence of deep vein thrombosis seen in the right lower extremity,  from the common femoral through the popliteal veins.  - No evidence of superficial venous thrombosis in the right lower  extremity.  - Deep vein reflux in the CFV and popliteal vein.  - Superficial vein reflux in the SFJ and GSV.      Left:  - No evidence of deep vein thrombosis seen in the left lower extremity,  from the common femoral through the popliteal veins.  - No evidence of superficial venous thrombosis in the left lower  extremity.  - Deep vein reflux in the CFV and FV.  - Superficial vein reflux in the SFJ and the SSV in the popliteal fossa.     *See table(s) above for measurements and observations.   Electronically signed by Monica Martinez MD on 12/19/2020 at 1:02:08 PM.      Assessment/Plan:  65 year old male presents for evaluation of venous insufficiency in the bilateral lower extremities.  Discussed with him that his reflux study does show evidence of superficial and deep reflux in the bilateral lower extremities likely contributing to his leg swelling.  That being said, he feels that his swelling has been stable and tolerable for decades.  I discussed the importance of weight loss as well as leg elevation and compression for conservative measures.  We sized him for knee-high compression stockings 15 to 20 mmHg today.   He does have long segment superficial reflux in the right leg that would potentially be amendable to laser ablation but again is not having any acutely worsening symptoms that would warrant intervention and with the horse chestnut extract he feels that his symptoms have completely resolved.  As it relates to his lower extremity arterial insufficiency, he has palpable pedal pulses and I have no immediate concerns from an arterial standpoint.  Discussed he can always follow-up with [place] if he feels his symptoms are worsening.     Marty Heck, MD [facility] Office: [phone]

## 2020-12-22 ENCOUNTER — Telehealth: Payer: Self-pay | Admitting: Cardiovascular Disease

## 2020-12-22 MED ORDER — NEBIVOLOL HCL 10 MG PO TABS: 10.0000 mg | ORAL_TABLET | Freq: Two times a day (BID) | ORAL | 2 refills | Status: DC

## 2020-12-22 NOTE — Telephone Encounter (Signed)
*  STAT* If patient is at the pharmacy, call can be transferred to refill team.   1. Which medications need to be refilled? (please list name of each medication and dose if known)  Nebivolol (Bystolic) 10 mg   2. Which pharmacy/location (including street and city if local pharmacy) is medication to be sent to? [id number] - [place], [place] - [address]  3. Do they need a 30 day or 90 day supply? 90 with refills   Patient states he normally just has to call [facility] and they will take care of getting all of his meds refilled for him.This time, however he was told to contact our office to get the medication refilled.  He has been going back and forth with [facility] to get the medication [facility]illed. He only has two days left of medication   Patient states he has been getting the generic version (Nebivolol) from his pharmacy but in his chart he was prescribed the name brand

## 2020-12-22 NOTE — Telephone Encounter (Signed)
Spoke with pt, Refill sent to the pharmacy electronically.  

## 2020-12-27 DIAGNOSIS — M76822 Posterior tibial tendinitis, left leg: Secondary | ICD-10-CM | POA: Diagnosis not present

## 2020-12-27 DIAGNOSIS — K602 Anal fissure, unspecified: Secondary | ICD-10-CM | POA: Diagnosis not present

## 2020-12-27 DIAGNOSIS — R69 Illness, unspecified: Secondary | ICD-10-CM | POA: Diagnosis not present

## 2020-12-27 DIAGNOSIS — W57XXXA Bitten or stung by nonvenomous insect and other nonvenomous arthropods, initial encounter: Secondary | ICD-10-CM | POA: Diagnosis not present

## 2020-12-27 DIAGNOSIS — R42 Dizziness and giddiness: Secondary | ICD-10-CM | POA: Diagnosis not present

## 2020-12-28 DIAGNOSIS — E291 Testicular hypofunction: Secondary | ICD-10-CM | POA: Diagnosis not present

## 2021-01-09 DIAGNOSIS — R69 Illness, unspecified: Secondary | ICD-10-CM | POA: Diagnosis not present

## 2021-01-11 DIAGNOSIS — M47894 Other spondylosis, thoracic region: Secondary | ICD-10-CM | POA: Diagnosis not present

## 2021-01-11 DIAGNOSIS — M5388 Other specified dorsopathies, sacral and sacrococcygeal region: Secondary | ICD-10-CM | POA: Diagnosis not present

## 2021-01-11 DIAGNOSIS — M9902 Segmental and somatic dysfunction of thoracic region: Secondary | ICD-10-CM | POA: Diagnosis not present

## 2021-01-11 DIAGNOSIS — M9903 Segmental and somatic dysfunction of lumbar region: Secondary | ICD-10-CM | POA: Diagnosis not present

## 2021-01-11 DIAGNOSIS — M4727 Other spondylosis with radiculopathy, lumbosacral region: Secondary | ICD-10-CM | POA: Diagnosis not present

## 2021-01-11 DIAGNOSIS — M9904 Segmental and somatic dysfunction of sacral region: Secondary | ICD-10-CM | POA: Diagnosis not present

## 2021-01-12 DIAGNOSIS — R519 Headache, unspecified: Secondary | ICD-10-CM | POA: Diagnosis not present

## 2021-01-12 DIAGNOSIS — Z6841 Body Mass Index (BMI) 40.0 and over, adult: Secondary | ICD-10-CM | POA: Diagnosis not present

## 2021-01-15 DIAGNOSIS — E1159 Type 2 diabetes mellitus with other circulatory complications: Secondary | ICD-10-CM | POA: Diagnosis not present

## 2021-01-15 DIAGNOSIS — E782 Mixed hyperlipidemia: Secondary | ICD-10-CM | POA: Diagnosis not present

## 2021-01-15 DIAGNOSIS — R69 Illness, unspecified: Secondary | ICD-10-CM | POA: Diagnosis not present

## 2021-01-18 DIAGNOSIS — M47894 Other spondylosis, thoracic region: Secondary | ICD-10-CM | POA: Diagnosis not present

## 2021-01-18 DIAGNOSIS — M4727 Other spondylosis with radiculopathy, lumbosacral region: Secondary | ICD-10-CM | POA: Diagnosis not present

## 2021-01-18 DIAGNOSIS — M9902 Segmental and somatic dysfunction of thoracic region: Secondary | ICD-10-CM | POA: Diagnosis not present

## 2021-01-18 DIAGNOSIS — M5388 Other specified dorsopathies, sacral and sacrococcygeal region: Secondary | ICD-10-CM | POA: Diagnosis not present

## 2021-01-18 DIAGNOSIS — M9904 Segmental and somatic dysfunction of sacral region: Secondary | ICD-10-CM | POA: Diagnosis not present

## 2021-01-18 DIAGNOSIS — M9903 Segmental and somatic dysfunction of lumbar region: Secondary | ICD-10-CM | POA: Diagnosis not present

## 2021-01-19 ENCOUNTER — Ambulatory Visit: Payer: Medicare HMO | Admitting: Sports Medicine

## 2021-01-19 DIAGNOSIS — R42 Dizziness and giddiness: Secondary | ICD-10-CM | POA: Diagnosis not present

## 2021-01-19 DIAGNOSIS — I152 Hypertension secondary to endocrine disorders: Secondary | ICD-10-CM | POA: Diagnosis not present

## 2021-01-19 DIAGNOSIS — E1159 Type 2 diabetes mellitus with other circulatory complications: Secondary | ICD-10-CM | POA: Diagnosis not present

## 2021-01-19 DIAGNOSIS — Z6841 Body Mass Index (BMI) 40.0 and over, adult: Secondary | ICD-10-CM | POA: Diagnosis not present

## 2021-01-22 DIAGNOSIS — M5388 Other specified dorsopathies, sacral and sacrococcygeal region: Secondary | ICD-10-CM | POA: Diagnosis not present

## 2021-01-22 DIAGNOSIS — M47894 Other spondylosis, thoracic region: Secondary | ICD-10-CM | POA: Diagnosis not present

## 2021-01-22 DIAGNOSIS — M9902 Segmental and somatic dysfunction of thoracic region: Secondary | ICD-10-CM | POA: Diagnosis not present

## 2021-01-22 DIAGNOSIS — M9903 Segmental and somatic dysfunction of lumbar region: Secondary | ICD-10-CM | POA: Diagnosis not present

## 2021-01-22 DIAGNOSIS — M4727 Other spondylosis with radiculopathy, lumbosacral region: Secondary | ICD-10-CM | POA: Diagnosis not present

## 2021-01-22 DIAGNOSIS — M9904 Segmental and somatic dysfunction of sacral region: Secondary | ICD-10-CM | POA: Diagnosis not present

## 2021-01-25 DIAGNOSIS — M9902 Segmental and somatic dysfunction of thoracic region: Secondary | ICD-10-CM | POA: Diagnosis not present

## 2021-01-25 DIAGNOSIS — M47894 Other spondylosis, thoracic region: Secondary | ICD-10-CM | POA: Diagnosis not present

## 2021-01-25 DIAGNOSIS — M9903 Segmental and somatic dysfunction of lumbar region: Secondary | ICD-10-CM | POA: Diagnosis not present

## 2021-01-25 DIAGNOSIS — M5388 Other specified dorsopathies, sacral and sacrococcygeal region: Secondary | ICD-10-CM | POA: Diagnosis not present

## 2021-01-25 DIAGNOSIS — M4727 Other spondylosis with radiculopathy, lumbosacral region: Secondary | ICD-10-CM | POA: Diagnosis not present

## 2021-01-25 DIAGNOSIS — M9904 Segmental and somatic dysfunction of sacral region: Secondary | ICD-10-CM | POA: Diagnosis not present

## 2021-01-29 DIAGNOSIS — E1159 Type 2 diabetes mellitus with other circulatory complications: Secondary | ICD-10-CM | POA: Diagnosis not present

## 2021-01-29 DIAGNOSIS — E782 Mixed hyperlipidemia: Secondary | ICD-10-CM | POA: Diagnosis not present

## 2021-01-30 DIAGNOSIS — E1142 Type 2 diabetes mellitus with diabetic polyneuropathy: Secondary | ICD-10-CM | POA: Diagnosis not present

## 2021-01-30 DIAGNOSIS — K5903 Drug induced constipation: Secondary | ICD-10-CM | POA: Diagnosis not present

## 2021-01-30 DIAGNOSIS — Z760 Encounter for issue of repeat prescription: Secondary | ICD-10-CM | POA: Diagnosis not present

## 2021-01-30 DIAGNOSIS — Z79891 Long term (current) use of opiate analgesic: Secondary | ICD-10-CM | POA: Diagnosis not present

## 2021-01-30 DIAGNOSIS — M62830 Muscle spasm of back: Secondary | ICD-10-CM | POA: Diagnosis not present

## 2021-01-30 DIAGNOSIS — M5136 Other intervertebral disc degeneration, lumbar region: Secondary | ICD-10-CM | POA: Diagnosis not present

## 2021-01-30 DIAGNOSIS — T402X5A Adverse effect of other opioids, initial encounter: Secondary | ICD-10-CM | POA: Diagnosis not present

## 2021-01-30 DIAGNOSIS — Z7984 Long term (current) use of oral hypoglycemic drugs: Secondary | ICD-10-CM | POA: Diagnosis not present

## 2021-01-30 DIAGNOSIS — G8929 Other chronic pain: Secondary | ICD-10-CM | POA: Diagnosis not present

## 2021-01-30 DIAGNOSIS — Z79899 Other long term (current) drug therapy: Secondary | ICD-10-CM | POA: Diagnosis not present

## 2021-01-31 ENCOUNTER — Encounter: Payer: Self-pay | Admitting: Sports Medicine

## 2021-01-31 ENCOUNTER — Ambulatory Visit: Payer: Medicare HMO | Admitting: Sports Medicine

## 2021-01-31 ENCOUNTER — Other Ambulatory Visit: Payer: Self-pay

## 2021-01-31 DIAGNOSIS — M79674 Pain in right toe(s): Secondary | ICD-10-CM | POA: Diagnosis not present

## 2021-01-31 DIAGNOSIS — M79675 Pain in left toe(s): Secondary | ICD-10-CM | POA: Diagnosis not present

## 2021-01-31 DIAGNOSIS — E088 Diabetes mellitus due to underlying condition with unspecified complications: Secondary | ICD-10-CM

## 2021-01-31 DIAGNOSIS — I739 Peripheral vascular disease, unspecified: Secondary | ICD-10-CM

## 2021-01-31 DIAGNOSIS — B351 Tinea unguium: Secondary | ICD-10-CM | POA: Diagnosis not present

## 2021-01-31 DIAGNOSIS — G629 Polyneuropathy, unspecified: Secondary | ICD-10-CM

## 2021-01-31 NOTE — Progress Notes (Signed)
Subjective: Peter Buck is a 65 y.o. male patient with history of diabetes who presents to office today complaining of long,mildly painful nails  while ambulating in shoes; unable to trim. Patient reports that his diabetic shoes seem to help but he tried to go to [facility] for a brace and wasted $500 because the brace caused more pain so he discontinued wearing it.  Fasting blood sugar does not check A1c 5.8 PCP Dr. Nyra Capes  Patient Active Problem List   Diagnosis Date Noted   Chronic venous insufficiency 12/19/2020   Localized, primary osteoarthritis of hand 11/24/2020   Pain in right hand 11/24/2020   Diabetic polyneuropathy associated with type 2 diabetes mellitus ([place]) 10/31/2020   Preoperative evaluation to rule out surgical contraindication 07/25/2020   Morbid obesity ([place]) 04/11/2020   Anemia    Arthritis    Asthma    Bipolar disorder ([place])    Diabetes mellitus without complication ([place])    GERD (gastroesophageal reflux disease)    Hypertension    Sleep apnea    Paroxysmal atrial fibrillation ([place]) 12/23/2019   Essential hypertension 11/02/2019   Diabetes mellitus due to underlying condition with unspecified complications ([place]) [id number]   Chronic pain of left knee 10/19/2019   Primary osteoarthritis of both first carpometacarpal joints 08/25/2019   Second degree burn of finger of right hand 08/25/2019   [place] 08/25/2019   Therapeutic opioid induced constipation 06/22/2019   Cervical radiculopathy 02/02/2019   Chronic pain syndrome 02/02/2019   Facet arthropathy, cervical 02/02/2019   Foraminal stenosis of cervical region 02/02/2019   Neuroforaminal stenosis of lumbar spine 12/17/2018   Pain medication agreement 10/16/2018   Pain associated with accessory navicular bone of foot, left 05/25/2018   Aftercare following surgery 04/23/2018   Muscle spasm of back 02/17/2018   DDD (degenerative disc disease), lumbar 11/25/2017   Low back pain 11/25/2017   Body mass index  (BMI) 38.0-38.9, adult 09/16/2017   Plantar fasciitis 01/02/2017   Arthritis, midfoot 12/05/2016   Borderline personality disorder ([place]) 04/29/2016   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   Bipolar affective disorder, depressed ([place]) 02/13/2016   Comprehensive diabetic foot examination, type 2 DM, encounter for ([place]) 12/25/2015   Onychomycosis due to dermatophyte 12/25/2015   Hammertoe 04/26/2014   Current Outpatient Medications on File Prior to Visit  Medication Sig Dispense Refill   acetaminophen (TYLENOL) 650 MG CR tablet Take 650 mg by mouth every 8 (eight) hours as needed for pain.     albuterol (VENTOLIN HFA) 108 (90 Base) MCG/ACT inhaler Inhale 2 puffs into the lungs every 6 (six) hours as needed for wheezing or shortness of breath. (Patient not taking: Reported on 12/19/2020)     Calcium Citrate-Vitamin D (CALCIUM CITRATE + D3 PO) Take 1 tablet by mouth at bedtime.     celecoxib (CELEBREX) 200 MG capsule Take 200 mg by mouth daily.     chlorpheniramine (CHLOR-TRIMETON) 4 MG tablet Take 4 mg by mouth 2 (two) times daily as needed for allergies. (Patient not taking: Reported on 12/19/2020)     diclofenac Sodium (VOLTAREN) 1 % GEL Apply 1 application topically 4 (four) times daily as needed (pain).     dronedarone (MULTAQ) 400 MG tablet Take 1 tablet (400 mg total) by mouth 2 (two) times daily with a meal. 180 tablet 3   ELIQUIS 5 MG TABS tablet Take 1 tablet (5 mg total) by mouth 2 (two) times daily. 180 tablet 3   EPINEPHrine 0.3 mg/0.3 mL IJ SOAJ injection Inject 0.3 mg  into the muscle as needed for anaphylaxis. (Patient not taking: Reported on 12/19/2020)     [place] 300 MG CAPS Take 300 mg by mouth in the morning and at bedtime.     HYDROcodone-acetaminophen (NORCO/VICODIN) 5-325 MG tablet Take 1 tablet by mouth every 8 (eight) hours as needed for moderate pain.     lubiprostone (AMITIZA) 24 MCG capsule Take 24 mcg by mouth 2 (two) times daily with a meal.     mometasone (NASONEX) 50  MCG/ACT nasal spray Place 1 spray into the nose in the morning and at bedtime.     nebivolol (BYSTOLIC) 10 MG tablet Take 1 tablet (10 mg total) by mouth 2 (two) times daily. 180 tablet 2   Oxcarbazepine (TRILEPTAL) 300 MG tablet Take 300 mg by mouth 2 (two) times daily.     OZEMPIC, 1 MG/DOSE, 4 MG/3ML SOPN Inject 1 mg into the muscle every Saturday. INJECT THE CONTENTS OF 1 PEN INJECTOR SUBQ ONCE WEEKLY     pregabalin (LYRICA) 200 MG capsule Take 200 mg by mouth 2 (two) times daily.     RELISTOR 150 MG TABS Take 3 tablets by mouth daily.     rosuvastatin (CRESTOR) 20 MG tablet Take 1 tablet (20 mg total) by mouth daily. 90 tablet 3   sildenafil (VIAGRA) 100 MG tablet Take 100 mg by mouth daily as needed for erectile dysfunction.     testosterone cypionate (DEPOTESTOSTERONE CYPIONATE) 200 MG/ML injection Inject 200 mg into the muscle every 21 ( twenty-one) days.     valsartan (DIOVAN) 160 MG tablet Take 160 mg by mouth at bedtime.     No current facility-administered medications on file prior to visit.   Allergies  Allergen Reactions   Augmentin [Amoxicillin-Pot Clavulanate] Anaphylaxis   Ivp Dye [Iodinated Diagnostic Agents] Anaphylaxis   Ketorolac Anaphylaxis   Toradol [Ketorolac Tromethamine] Anaphylaxis    "It put me in shock.  My blood pressure tanked and I was itchy all over; had to go to the ER."   Atorvastatin Other (See Comments)    Severe back ache Severe back ache Severe back ache Severe back ache   Guaifenesin Other (See Comments) and Swelling    "It sorta made my throat close up." "It sorta made my throat close up." "It sorta made my throat close up."   Penicillins Dermatitis   Alendronate Sodium Other (See Comments)    Unknown reaction    Dextromethorphan Other (See Comments)    Unknown reaction   Pseudoephedrine Other (See Comments)    "It dries me out.  I take it when my other allergy medicine isn't working."    No results found for this or any previous visit  (from the past 2160 hour(s)).  Objective: General: Patient is awake, alert, and oriented x 3 and in no acute distress.  Integument: Skin is warm, dry and supple bilateral. Nails are tender, long, thickened and dystrophic with subungual debris, consistent with onychomycosis, 1-5 bilateral. No signs of infection.  Dry skin bilateral.  No open lesions or preulcerative lesions present bilateral. Remaining integument unremarkable.  Vasculature:  Dorsalis Pedis pulse 1/4 bilateral. Posterior Tibial pulse  0/4 bilateral due to edema. Capillary fill time <5 sec 1-5 bilateral.  Scant positive hair growth to the level of the digits.Temperature gradient within normal limits.  Mild varicosities present bilateral ankles with severe varicosities noted in upper thighs and legs.  1+ pitting edema noted bilateral.  Neurology: Protective sensation intact vibratory sensation diminished bilateral.  No Babinski  sign present bilateral.   Musculoskeletal: Asymptomatic pes planus and hammertoe deformity noted bilateral. There is limited ankle joint range of motion bilateral consistent with equinus deformity.  No tenderness with calf compression bilateral.  Assessment and Plan: Problem List Items Addressed This Visit       Endocrine   Diabetes mellitus due to underlying condition with unspecified complications ([place])   Other Visit Diagnoses     Pain due to onychomycosis of toenails of both feet    -  Primary   Neuropathy       PVD (peripheral vascular disease) ([place])          -Examined patient. -Discussed and educated patient on diabetic foot care, especially with regards to the vascular, neurological and musculoskeletal systems.  -Mechanically debrided all nails 1-5 bilateral using sterile nail nipper and filed with dremel without incident  -Continue with diabetic shoes -Referral placed for vein and vascular for further recommendations for varicose veins since he has not heard back from them from  previous -Recommend Nervive PM since lyrica is ineffective and he is having tingling again -Recommend cherry juice to prevent gout flare ups -Patient to return  in 3 months for at risk foot care -Patient advised to call the office if any problems or questions arise in the meantime.  Landis Martins, DPM

## 2021-01-31 NOTE — Patient Instructions (Addendum)
Nervive PM nerve relief supplement for your nerves can be purchased OTC at [facility]  Concentrated Tart Cherry juice or tabs from [facility] or [facility] to decrease gout flares And

## 2021-02-01 DIAGNOSIS — M9903 Segmental and somatic dysfunction of lumbar region: Secondary | ICD-10-CM | POA: Diagnosis not present

## 2021-02-01 DIAGNOSIS — M9904 Segmental and somatic dysfunction of sacral region: Secondary | ICD-10-CM | POA: Diagnosis not present

## 2021-02-01 DIAGNOSIS — M47894 Other spondylosis, thoracic region: Secondary | ICD-10-CM | POA: Diagnosis not present

## 2021-02-01 DIAGNOSIS — M4727 Other spondylosis with radiculopathy, lumbosacral region: Secondary | ICD-10-CM | POA: Diagnosis not present

## 2021-02-01 DIAGNOSIS — M9902 Segmental and somatic dysfunction of thoracic region: Secondary | ICD-10-CM | POA: Diagnosis not present

## 2021-02-01 DIAGNOSIS — M5388 Other specified dorsopathies, sacral and sacrococcygeal region: Secondary | ICD-10-CM | POA: Diagnosis not present

## 2021-02-05 DIAGNOSIS — E291 Testicular hypofunction: Secondary | ICD-10-CM | POA: Diagnosis not present

## 2021-02-12 DIAGNOSIS — E782 Mixed hyperlipidemia: Secondary | ICD-10-CM | POA: Diagnosis not present

## 2021-02-12 DIAGNOSIS — Z6841 Body Mass Index (BMI) 40.0 and over, adult: Secondary | ICD-10-CM | POA: Diagnosis not present

## 2021-02-12 DIAGNOSIS — I152 Hypertension secondary to endocrine disorders: Secondary | ICD-10-CM | POA: Diagnosis not present

## 2021-02-12 DIAGNOSIS — E1159 Type 2 diabetes mellitus with other circulatory complications: Secondary | ICD-10-CM | POA: Diagnosis not present

## 2021-02-14 DIAGNOSIS — M47894 Other spondylosis, thoracic region: Secondary | ICD-10-CM | POA: Diagnosis not present

## 2021-02-14 DIAGNOSIS — M5388 Other specified dorsopathies, sacral and sacrococcygeal region: Secondary | ICD-10-CM | POA: Diagnosis not present

## 2021-02-14 DIAGNOSIS — M4727 Other spondylosis with radiculopathy, lumbosacral region: Secondary | ICD-10-CM | POA: Diagnosis not present

## 2021-02-14 DIAGNOSIS — M9904 Segmental and somatic dysfunction of sacral region: Secondary | ICD-10-CM | POA: Diagnosis not present

## 2021-02-14 DIAGNOSIS — I152 Hypertension secondary to endocrine disorders: Secondary | ICD-10-CM | POA: Diagnosis not present

## 2021-02-14 DIAGNOSIS — M9902 Segmental and somatic dysfunction of thoracic region: Secondary | ICD-10-CM | POA: Diagnosis not present

## 2021-02-14 DIAGNOSIS — M9903 Segmental and somatic dysfunction of lumbar region: Secondary | ICD-10-CM | POA: Diagnosis not present

## 2021-02-15 DIAGNOSIS — I152 Hypertension secondary to endocrine disorders: Secondary | ICD-10-CM | POA: Diagnosis not present

## 2021-02-15 DIAGNOSIS — R69 Illness, unspecified: Secondary | ICD-10-CM | POA: Diagnosis not present

## 2021-02-15 DIAGNOSIS — E1159 Type 2 diabetes mellitus with other circulatory complications: Secondary | ICD-10-CM | POA: Diagnosis not present

## 2021-02-15 DIAGNOSIS — E782 Mixed hyperlipidemia: Secondary | ICD-10-CM | POA: Diagnosis not present

## 2021-02-21 DIAGNOSIS — M25551 Pain in right hip: Secondary | ICD-10-CM | POA: Diagnosis not present

## 2021-02-21 DIAGNOSIS — M5136 Other intervertebral disc degeneration, lumbar region: Secondary | ICD-10-CM | POA: Diagnosis not present

## 2021-02-26 DIAGNOSIS — E291 Testicular hypofunction: Secondary | ICD-10-CM | POA: Diagnosis not present

## 2021-02-27 DIAGNOSIS — F319 Bipolar disorder, unspecified: Secondary | ICD-10-CM | POA: Diagnosis not present

## 2021-02-27 DIAGNOSIS — R69 Illness, unspecified: Secondary | ICD-10-CM | POA: Diagnosis not present

## 2021-03-05 DIAGNOSIS — Z5321 Procedure and treatment not carried out due to patient leaving prior to being seen by health care provider: Secondary | ICD-10-CM | POA: Diagnosis not present

## 2021-03-05 DIAGNOSIS — H10022 Other mucopurulent conjunctivitis, left eye: Secondary | ICD-10-CM | POA: Diagnosis not present

## 2021-03-05 DIAGNOSIS — H10021 Other mucopurulent conjunctivitis, right eye: Secondary | ICD-10-CM | POA: Diagnosis not present

## 2021-03-06 DIAGNOSIS — R4789 Other speech disturbances: Secondary | ICD-10-CM | POA: Diagnosis not present

## 2021-03-07 DIAGNOSIS — H26492 Other secondary cataract, left eye: Secondary | ICD-10-CM | POA: Diagnosis not present

## 2021-03-08 DIAGNOSIS — R0989 Other specified symptoms and signs involving the circulatory and respiratory systems: Secondary | ICD-10-CM | POA: Diagnosis not present

## 2021-03-08 DIAGNOSIS — Z6841 Body Mass Index (BMI) 40.0 and over, adult: Secondary | ICD-10-CM | POA: Diagnosis not present

## 2021-03-08 DIAGNOSIS — Z9109 Other allergy status, other than to drugs and biological substances: Secondary | ICD-10-CM | POA: Diagnosis not present

## 2021-03-08 DIAGNOSIS — H109 Unspecified conjunctivitis: Secondary | ICD-10-CM | POA: Diagnosis not present

## 2021-03-14 DIAGNOSIS — Z6841 Body Mass Index (BMI) 40.0 and over, adult: Secondary | ICD-10-CM | POA: Diagnosis not present

## 2021-03-14 DIAGNOSIS — J029 Acute pharyngitis, unspecified: Secondary | ICD-10-CM | POA: Diagnosis not present

## 2021-03-14 DIAGNOSIS — J020 Streptococcal pharyngitis: Secondary | ICD-10-CM | POA: Diagnosis not present

## 2021-03-17 DIAGNOSIS — F319 Bipolar disorder, unspecified: Secondary | ICD-10-CM | POA: Diagnosis not present

## 2021-03-17 DIAGNOSIS — Z7189 Other specified counseling: Secondary | ICD-10-CM | POA: Diagnosis not present

## 2021-03-17 DIAGNOSIS — E782 Mixed hyperlipidemia: Secondary | ICD-10-CM | POA: Diagnosis not present

## 2021-03-17 DIAGNOSIS — R69 Illness, unspecified: Secondary | ICD-10-CM | POA: Diagnosis not present

## 2021-03-17 DIAGNOSIS — E1159 Type 2 diabetes mellitus with other circulatory complications: Secondary | ICD-10-CM | POA: Diagnosis not present

## 2021-03-19 DIAGNOSIS — I4891 Unspecified atrial fibrillation: Secondary | ICD-10-CM | POA: Diagnosis not present

## 2021-03-19 DIAGNOSIS — R42 Dizziness and giddiness: Secondary | ICD-10-CM | POA: Diagnosis not present

## 2021-03-19 DIAGNOSIS — H903 Sensorineural hearing loss, bilateral: Secondary | ICD-10-CM | POA: Diagnosis not present

## 2021-03-19 DIAGNOSIS — E291 Testicular hypofunction: Secondary | ICD-10-CM | POA: Diagnosis not present

## 2021-03-19 DIAGNOSIS — E669 Obesity, unspecified: Secondary | ICD-10-CM | POA: Diagnosis not present

## 2021-03-19 DIAGNOSIS — E119 Type 2 diabetes mellitus without complications: Secondary | ICD-10-CM | POA: Diagnosis not present

## 2021-03-19 DIAGNOSIS — H61303 Acquired stenosis of external ear canal, unspecified, bilateral: Secondary | ICD-10-CM | POA: Diagnosis not present

## 2021-03-19 DIAGNOSIS — H6123 Impacted cerumen, bilateral: Secondary | ICD-10-CM | POA: Diagnosis not present

## 2021-03-19 DIAGNOSIS — R269 Unspecified abnormalities of gait and mobility: Secondary | ICD-10-CM | POA: Diagnosis not present

## 2021-03-20 ENCOUNTER — Telehealth: Payer: Self-pay | Admitting: Cardiovascular Disease

## 2021-03-20 NOTE — Telephone Encounter (Signed)
Patient states he is interested in taking ginger lemon cinnamon tea, he saw online, for weight loss. He also read that it is a mild blood-thinner. He would like to know if Dr. [name] agrees with this. Please advise.

## 2021-03-21 NOTE — Telephone Encounter (Signed)
Called pt to let him know what Dr. [name] stated. No answer at this time, left the information on the machine.

## 2021-03-22 DIAGNOSIS — R4789 Other speech disturbances: Secondary | ICD-10-CM | POA: Diagnosis not present

## 2021-03-22 DIAGNOSIS — R4701 Aphasia: Secondary | ICD-10-CM | POA: Diagnosis not present

## 2021-03-27 DIAGNOSIS — R479 Unspecified speech disturbances: Secondary | ICD-10-CM | POA: Diagnosis not present

## 2021-03-27 DIAGNOSIS — G459 Transient cerebral ischemic attack, unspecified: Secondary | ICD-10-CM | POA: Diagnosis not present

## 2021-04-03 DIAGNOSIS — R4701 Aphasia: Secondary | ICD-10-CM | POA: Diagnosis not present

## 2021-04-03 DIAGNOSIS — R4789 Other speech disturbances: Secondary | ICD-10-CM | POA: Diagnosis not present

## 2021-04-04 DIAGNOSIS — H26491 Other secondary cataract, right eye: Secondary | ICD-10-CM | POA: Diagnosis not present

## 2021-04-09 DIAGNOSIS — E291 Testicular hypofunction: Secondary | ICD-10-CM | POA: Diagnosis not present

## 2021-04-17 DIAGNOSIS — R69 Illness, unspecified: Secondary | ICD-10-CM | POA: Diagnosis not present

## 2021-04-17 DIAGNOSIS — R4701 Aphasia: Secondary | ICD-10-CM | POA: Diagnosis not present

## 2021-04-17 DIAGNOSIS — F319 Bipolar disorder, unspecified: Secondary | ICD-10-CM | POA: Diagnosis not present

## 2021-04-17 DIAGNOSIS — R4789 Other speech disturbances: Secondary | ICD-10-CM | POA: Diagnosis not present

## 2021-04-17 DIAGNOSIS — E782 Mixed hyperlipidemia: Secondary | ICD-10-CM | POA: Diagnosis not present

## 2021-04-17 DIAGNOSIS — E1159 Type 2 diabetes mellitus with other circulatory complications: Secondary | ICD-10-CM | POA: Diagnosis not present

## 2021-04-23 DIAGNOSIS — N529 Male erectile dysfunction, unspecified: Secondary | ICD-10-CM | POA: Diagnosis not present

## 2021-04-23 DIAGNOSIS — J309 Allergic rhinitis, unspecified: Secondary | ICD-10-CM | POA: Diagnosis not present

## 2021-04-23 DIAGNOSIS — Z6841 Body Mass Index (BMI) 40.0 and over, adult: Secondary | ICD-10-CM | POA: Diagnosis not present

## 2021-04-23 DIAGNOSIS — I10 Essential (primary) hypertension: Secondary | ICD-10-CM | POA: Diagnosis not present

## 2021-04-24 DIAGNOSIS — F319 Bipolar disorder, unspecified: Secondary | ICD-10-CM | POA: Diagnosis not present

## 2021-04-24 DIAGNOSIS — R69 Illness, unspecified: Secondary | ICD-10-CM | POA: Diagnosis not present

## 2021-04-26 DIAGNOSIS — R4789 Other speech disturbances: Secondary | ICD-10-CM | POA: Diagnosis not present

## 2021-04-26 DIAGNOSIS — R4701 Aphasia: Secondary | ICD-10-CM | POA: Diagnosis not present

## 2021-04-30 DIAGNOSIS — E291 Testicular hypofunction: Secondary | ICD-10-CM | POA: Diagnosis not present

## 2021-05-01 DIAGNOSIS — Z7984 Long term (current) use of oral hypoglycemic drugs: Secondary | ICD-10-CM | POA: Diagnosis not present

## 2021-05-01 DIAGNOSIS — T402X5A Adverse effect of other opioids, initial encounter: Secondary | ICD-10-CM | POA: Diagnosis not present

## 2021-05-01 DIAGNOSIS — M4722 Other spondylosis with radiculopathy, cervical region: Secondary | ICD-10-CM | POA: Diagnosis not present

## 2021-05-01 DIAGNOSIS — K5903 Drug induced constipation: Secondary | ICD-10-CM | POA: Diagnosis not present

## 2021-05-01 DIAGNOSIS — M62830 Muscle spasm of back: Secondary | ICD-10-CM | POA: Diagnosis not present

## 2021-05-01 DIAGNOSIS — M5136 Other intervertebral disc degeneration, lumbar region: Secondary | ICD-10-CM | POA: Diagnosis not present

## 2021-05-01 DIAGNOSIS — F1190 Opioid use, unspecified, uncomplicated: Secondary | ICD-10-CM | POA: Insufficient documentation

## 2021-05-01 DIAGNOSIS — Z79891 Long term (current) use of opiate analgesic: Secondary | ICD-10-CM | POA: Diagnosis not present

## 2021-05-01 DIAGNOSIS — E1142 Type 2 diabetes mellitus with diabetic polyneuropathy: Secondary | ICD-10-CM | POA: Diagnosis not present

## 2021-05-01 DIAGNOSIS — Z79899 Other long term (current) drug therapy: Secondary | ICD-10-CM | POA: Diagnosis not present

## 2021-05-01 DIAGNOSIS — G8929 Other chronic pain: Secondary | ICD-10-CM | POA: Diagnosis not present

## 2021-05-02 DIAGNOSIS — M1990 Unspecified osteoarthritis, unspecified site: Secondary | ICD-10-CM | POA: Diagnosis not present

## 2021-05-02 DIAGNOSIS — R768 Other specified abnormal immunological findings in serum: Secondary | ICD-10-CM | POA: Diagnosis not present

## 2021-05-02 DIAGNOSIS — M8580 Other specified disorders of bone density and structure, unspecified site: Secondary | ICD-10-CM | POA: Diagnosis not present

## 2021-05-02 DIAGNOSIS — M19049 Primary osteoarthritis, unspecified hand: Secondary | ICD-10-CM | POA: Diagnosis not present

## 2021-05-02 DIAGNOSIS — M25561 Pain in right knee: Secondary | ICD-10-CM | POA: Diagnosis not present

## 2021-05-04 ENCOUNTER — Ambulatory Visit: Payer: Medicare HMO | Admitting: Sports Medicine

## 2021-05-08 DIAGNOSIS — E782 Mixed hyperlipidemia: Secondary | ICD-10-CM | POA: Diagnosis not present

## 2021-05-08 DIAGNOSIS — E1159 Type 2 diabetes mellitus with other circulatory complications: Secondary | ICD-10-CM | POA: Diagnosis not present

## 2021-05-09 DIAGNOSIS — Z23 Encounter for immunization: Secondary | ICD-10-CM | POA: Diagnosis not present

## 2021-05-17 DIAGNOSIS — R69 Illness, unspecified: Secondary | ICD-10-CM | POA: Diagnosis not present

## 2021-05-17 DIAGNOSIS — F319 Bipolar disorder, unspecified: Secondary | ICD-10-CM | POA: Diagnosis not present

## 2021-05-17 DIAGNOSIS — E1159 Type 2 diabetes mellitus with other circulatory complications: Secondary | ICD-10-CM | POA: Diagnosis not present

## 2021-05-17 DIAGNOSIS — E782 Mixed hyperlipidemia: Secondary | ICD-10-CM | POA: Diagnosis not present

## 2021-05-17 DIAGNOSIS — I152 Hypertension secondary to endocrine disorders: Secondary | ICD-10-CM | POA: Diagnosis not present

## 2021-05-21 DIAGNOSIS — E291 Testicular hypofunction: Secondary | ICD-10-CM | POA: Diagnosis not present

## 2021-05-22 DIAGNOSIS — M5136 Other intervertebral disc degeneration, lumbar region: Secondary | ICD-10-CM | POA: Diagnosis not present

## 2021-05-22 DIAGNOSIS — M5451 Vertebrogenic low back pain: Secondary | ICD-10-CM | POA: Diagnosis not present

## 2021-05-24 DIAGNOSIS — R4789 Other speech disturbances: Secondary | ICD-10-CM | POA: Diagnosis not present

## 2021-05-24 DIAGNOSIS — G4733 Obstructive sleep apnea (adult) (pediatric): Secondary | ICD-10-CM | POA: Diagnosis not present

## 2021-05-24 DIAGNOSIS — Z9989 Dependence on other enabling machines and devices: Secondary | ICD-10-CM | POA: Diagnosis not present

## 2021-05-25 ENCOUNTER — Emergency Department (HOSPITAL_COMMUNITY)
Admission: EM | Admit: 2021-05-25 | Discharge: 2021-05-26 | Disposition: A | Discharge status: Home / Self Care | Payer: Medicare HMO | Attending: Emergency Medicine | Admitting: Emergency Medicine

## 2021-05-25 ENCOUNTER — Emergency Department (HOSPITAL_COMMUNITY): Payer: Medicare HMO

## 2021-05-25 ENCOUNTER — Other Ambulatory Visit: Payer: Self-pay

## 2021-05-25 DIAGNOSIS — Z79899 Other long term (current) drug therapy: Secondary | ICD-10-CM | POA: Insufficient documentation

## 2021-05-25 DIAGNOSIS — Z7901 Long term (current) use of anticoagulants: Secondary | ICD-10-CM | POA: Diagnosis not present

## 2021-05-25 DIAGNOSIS — Z5321 Procedure and treatment not carried out due to patient leaving prior to being seen by health care provider: Secondary | ICD-10-CM | POA: Insufficient documentation

## 2021-05-25 DIAGNOSIS — R61 Generalized hyperhidrosis: Secondary | ICD-10-CM | POA: Diagnosis not present

## 2021-05-25 DIAGNOSIS — J45909 Unspecified asthma, uncomplicated: Secondary | ICD-10-CM | POA: Diagnosis not present

## 2021-05-25 DIAGNOSIS — R0789 Other chest pain: Secondary | ICD-10-CM | POA: Diagnosis not present

## 2021-05-25 DIAGNOSIS — E1142 Type 2 diabetes mellitus with diabetic polyneuropathy: Secondary | ICD-10-CM | POA: Insufficient documentation

## 2021-05-25 DIAGNOSIS — R079 Chest pain, unspecified: Secondary | ICD-10-CM | POA: Insufficient documentation

## 2021-05-25 DIAGNOSIS — I10 Essential (primary) hypertension: Secondary | ICD-10-CM | POA: Diagnosis not present

## 2021-05-25 LAB — CBC
HCT: 49.3 % (ref 39.0–52.0)
Hemoglobin: 15.6 g/dL (ref 13.0–17.0)
MCH: 29.2 pg (ref 26.0–34.0)
MCHC: 31.6 g/dL (ref 30.0–36.0)
MCV: 92.3 fL (ref 80.0–100.0)
Platelets: 202 10*3/uL (ref 150–400)
RBC: 5.34 MIL/uL (ref 4.22–5.81)
RDW: 13.2 % (ref 11.5–15.5)
WBC: 6.9 10*3/uL (ref 4.0–10.5)
nRBC: 0 % (ref 0.0–0.2)

## 2021-05-25 LAB — BASIC METABOLIC PANEL
Anion gap: 8 (ref 5–15)
BUN: 23 mg/dL (ref 8–23)
CO2: 28 mmol/L (ref 22–32)
Calcium: 9.5 mg/dL (ref 8.9–10.3)
Chloride: 100 mmol/L (ref 98–111)
Creatinine, Ser: 1.21 mg/dL (ref 0.61–1.24)
GFR, Estimated: >60 mL/min (ref >60)
Glucose, Bld: 99 mg/dL (ref 70–99)
Potassium: 4.6 mmol/L (ref 3.5–5.1)
Sodium: 136 mmol/L (ref 135–145)

## 2021-05-25 NOTE — ED Triage Notes (Signed)
Pt here for cp that started this morning, lasting only approx 10 min, sharp central cp that radiated to back w/ diaphoresis, denies shob, N/V.Peter Buck Pt reports now having a constant dull pain in chest.

## 2021-05-25 NOTE — ED Provider Notes (Signed)
Emergency Medicine Provider Triage Evaluation Note  Peter Buck , a 65 y.o. male  was evaluated in triage.  Pt complains of chest pain today. The patient reports he had a 10 minute episode of sternal chest pain that radiated to his back with diaphoresis then was a persistent dull pain till now. Denies any SOB, nausea, or vomiting. On eliquis for afib that is rate and rhythm controlled  Review of Systems  Positive: Chest pain, diaphoresis  Negative: SOB, N/v, abdominal pain  Physical Exam  BP (!) 158/74 (BP Location: Right Arm)   Pulse 64   Temp 98.8 F (37.1 C) (Oral)   Resp 18   SpO2 97%  Peter:   Awake, no distress, not diaphoretic Resp:  Normal effort  MSK:   Moves extremities without difficulty  Other:  RRR  Medical Decision Making  Medically screening exam initiated at 9:01 PM.  Appropriate orders placed.  Peter Buck was informed that the remainder of the evaluation will be completed by another provider, this initial triage assessment does not replace that evaluation, and the importance of remaining in the ED until their evaluation is complete.  Chest pain order set placed   Sherrell Puller, Hershal Coria 05/25/21 2106    Tegeler, Gwenyth Allegra, MD 05/25/21 [phone]

## 2021-05-26 ENCOUNTER — Emergency Department (HOSPITAL_BASED_OUTPATIENT_CLINIC_OR_DEPARTMENT_OTHER): Payer: Medicare HMO | Admitting: Radiology

## 2021-05-26 ENCOUNTER — Other Ambulatory Visit: Payer: Self-pay

## 2021-05-26 ENCOUNTER — Emergency Department (HOSPITAL_BASED_OUTPATIENT_CLINIC_OR_DEPARTMENT_OTHER)
Admission: EM | Admit: 2021-05-26 | Discharge: 2021-05-26 | Disposition: A | Discharge status: Home / Self Care | Payer: Medicare HMO | Source: Home / Self Care | Attending: Emergency Medicine | Admitting: Emergency Medicine

## 2021-05-26 ENCOUNTER — Encounter (HOSPITAL_BASED_OUTPATIENT_CLINIC_OR_DEPARTMENT_OTHER): Payer: Self-pay

## 2021-05-26 DIAGNOSIS — R072 Precordial pain: Secondary | ICD-10-CM | POA: Insufficient documentation

## 2021-05-26 DIAGNOSIS — Z7901 Long term (current) use of anticoagulants: Secondary | ICD-10-CM | POA: Insufficient documentation

## 2021-05-26 DIAGNOSIS — I10 Essential (primary) hypertension: Secondary | ICD-10-CM | POA: Insufficient documentation

## 2021-05-26 DIAGNOSIS — Z79899 Other long term (current) drug therapy: Secondary | ICD-10-CM | POA: Insufficient documentation

## 2021-05-26 DIAGNOSIS — R0789 Other chest pain: Secondary | ICD-10-CM | POA: Diagnosis not present

## 2021-05-26 DIAGNOSIS — J45909 Unspecified asthma, uncomplicated: Secondary | ICD-10-CM | POA: Insufficient documentation

## 2021-05-26 DIAGNOSIS — E1142 Type 2 diabetes mellitus with diabetic polyneuropathy: Secondary | ICD-10-CM | POA: Insufficient documentation

## 2021-05-26 DIAGNOSIS — R079 Chest pain, unspecified: Secondary | ICD-10-CM | POA: Diagnosis not present

## 2021-05-26 LAB — CBC
HCT: 49.7 % (ref 39.0–52.0)
Hemoglobin: 15.9 g/dL (ref 13.0–17.0)
MCH: 28.5 pg (ref 26.0–34.0)
MCHC: 32 g/dL (ref 30.0–36.0)
MCV: 89.1 fL (ref 80.0–100.0)
Platelets: 192 10*3/uL (ref 150–400)
RBC: 5.58 MIL/uL (ref 4.22–5.81)
RDW: 13.2 % (ref 11.5–15.5)
WBC: 6.5 10*3/uL (ref 4.0–10.5)
nRBC: 0 % (ref 0.0–0.2)

## 2021-05-26 LAB — BASIC METABOLIC PANEL
Anion gap: 5 (ref 5–15)
BUN: 19 mg/dL (ref 8–23)
CO2: 32 mmol/L (ref 22–32)
Calcium: 10 mg/dL (ref 8.9–10.3)
Chloride: 100 mmol/L (ref 98–111)
Creatinine, Ser: 1.15 mg/dL (ref 0.61–1.24)
GFR, Estimated: >60 mL/min (ref >60)
Glucose, Bld: 106 mg/dL — ABNORMAL HIGH (ref 70–99)
Potassium: 4.3 mmol/L (ref 3.5–5.1)
Sodium: 137 mmol/L (ref 135–145)

## 2021-05-26 LAB — TROPONIN I (HIGH SENSITIVITY)
Troponin I (High Sensitivity): 6 ng/L (ref <18)
Troponin I (High Sensitivity): 5 ng/L (ref <18)
Troponin I (High Sensitivity): 4 ng/L (ref <18)

## 2021-05-26 NOTE — ED Triage Notes (Signed)
Pt presents with ongoing CP, seen yesterday at [facility] ED for the same. Wait was too long, pt left AMA.   Pt here today d/t ongoing symptoms. Pt reports the pain is different today than it was yesterday. Today it is non radiating dull pressure to mid-sternum region. Pt feels he may have pulled a muscle while working out

## 2021-05-26 NOTE — ED Provider Notes (Signed)
[facility] EMERGENCY DEPT Provider Note   CSN: [id number] Arrival date & time: 05/26/21  1316     History Chief Complaint  Patient presents with   Chest Pain    Peter Buck is a 65 y.o. male.  He is here with a complaint of chest pain that started yesterday.  He said it began yesterday while at rest sharp pressure substernal 10 out of 10 that went through to his back.  Was not associated with any diaphoresis shortness of breath or dizziness.  Was a little nauseous.  He went to [facility] where they did labs and an EKG.  He ultimately left without being seen.  Today the pain is still there he rates it now as 1 out of 10.  He thinks it may be related to some exercise he has been doing.  The history is provided by the patient.  Chest Pain Pain location:  Substernal area Pain quality: pressure and sharp   Pain radiates to:  Mid back Pain severity:  Severe Onset quality:  Sudden Duration:  2 days Progression:  Partially resolved Chronicity:  New Context: at rest   Relieved by:  None tried Worsened by:  Nothing Ineffective treatments:  None tried Associated symptoms: lower extremity edema (chronic) and nausea   Associated symptoms: no abdominal pain, no cough, no diaphoresis, no dizziness, no fever, no heartburn, no shortness of breath and no vomiting   Risk factors: diabetes mellitus, hypertension and male sex   Risk factors: no smoking       Past Medical History:  Diagnosis Date   Aftercare following surgery 04/23/2018   Anemia    Arthritis    Arthritis, midfoot 12/05/2016   Asthma    Bipolar affective disorder, depressed (Peter Buck) 02/13/2016   Bipolar disorder (Peter Buck)    Body mass index (bmi) 38.0-38.9, adult 09/16/2017   Body mass index (BMI) 38.0-38.9, adult 09/16/2017   Formatting of this note might be different from the original. IMO 10/01 Updates   Borderline personality disorder (Peter Buck) 04/29/2016   Cervical radiculopathy 02/02/2019   Last Assessment & Plan:   Formatting of this note might be different from the original. MRI of the cervical spine shows cervical spondylosis with facet arthrosis most notable for mild to moderate left neural foraminal stenosis at C3-4.  Per patient, nerve conduction studies showed right C7-8 radiculopathy.    Continue with current regimen as outlined in lumbar degenerative disc plan.   Chronic pain of left knee 10/19/2019   Chronic pain syndrome 02/02/2019   Comprehensive diabetic foot examination, type 2 DM, encounter for (Peter Buck) 12/25/2015   DDD (degenerative disc disease), lumbar 11/25/2017   Last Assessment & Plan:  Formatting of this note might be different from the original. 65 year old male with chronic, predominantly left-sided low back pain with radiation into the left hip and groin.  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  male with Low back pain, and acute on chronic low back pain with left hip, knee, and foot pain.  Today I will treat with the patient's current medication of tramadol and diclofenac and add relafen (temporarily d/c diclofenac) for 10 days and increase lyrica. He will continue with his current appt with his neurosurgery.  Peter Buck will return to    Muscle spasm of back 02/17/2018   Last Assessment & Plan:  Formatting of this note might be different from the original. Continue with tizanidine 4 mg  as needed for muscle spasming up to 3 times daily.   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 Review of prior UDS results are within normal limits.  Peter Buck controlled substance registry reviewed and there were no inconsistencies noted.   Paroxysmal atrial fibrillation ([place]) 12/23/2019   Plantar fasciitis 01/02/2017   Primary osteoarthritis of both first carpometacarpal joints 08/25/2019   Second degree burn of finger of right hand 08/25/2019   Sleep apnea    Therapeutic opioid induced constipation 06/22/2019   Last Assessment & Plan:  Formatting of this note might be different from the original. Continue amitiza 46mcg b.i.d.   Verruca 08/25/2019    Patient Active Problem List   Diagnosis Date Noted   Chronic venous insufficiency 12/19/2020   Localized, primary osteoarthritis of hand 11/24/2020   Pain in right hand 11/24/2020   Diabetic polyneuropathy associated with type 2 diabetes mellitus (Peter Buck) 10/31/2020   Preoperative evaluation to rule out surgical contraindication 07/25/2020   Morbid obesity (Peter Buck) 04/11/2020   Anemia    Arthritis    Asthma    Bipolar disorder ([place])    Diabetes mellitus without complication ([place])    GERD (gastroesophageal reflux disease)    Hypertension    Sleep apnea    Paroxysmal atrial fibrillation (Peter Buck) 12/23/2019   Essential hypertension 11/02/2019   Diabetes mellitus due to underlying condition with unspecified complications (Peter Buck) 37/90/2409   Chronic pain of left knee 10/19/2019   Primary osteoarthritis of both first carpometacarpal joints 08/25/2019   Second degree burn of finger of right hand 08/25/2019   Verruca 08/25/2019   Therapeutic opioid induced constipation 06/22/2019   Cervical  radiculopathy 02/02/2019   Chronic pain syndrome 02/02/2019   Facet arthropathy, cervical 02/02/2019   Foraminal stenosis of cervical region 02/02/2019   Neuroforaminal stenosis of lumbar spine 12/17/2018   Pain medication agreement 10/16/2018   Pain associated with accessory navicular bone of foot, left 05/25/2018   Aftercare following surgery 04/23/2018   Muscle spasm of back 02/17/2018   DDD (degenerative disc disease), lumbar 11/25/2017   Low back pain 11/25/2017   Body mass index (BMI) 38.0-38.9, adult 09/16/2017   Plantar fasciitis 01/02/2017   Arthritis, midfoot 12/05/2016   Borderline personality disorder ([place]) 04/29/2016   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   Bipolar affective disorder, depressed ([place]) 02/13/2016   Comprehensive diabetic foot examination, type 2 DM, encounter for ([place]) 12/25/2015   Onychomycosis due to dermatophyte 12/25/2015   Hammertoe 04/26/2014    Past Surgical History:  Procedure Laterality Date   APPENDECTOMY     CARDIOVERSION N/A 07/05/2020   Procedure: CARDIOVERSION;  Surgeon: [name], MD;  Location: [facility];  Service: Cardiovascular;  Laterality: N/A;   CHOLECYSTECTOMY  INNER EAR SURGERY     KNEE ARTHROSCOPY         Family History  Problem Relation Age of Onset   Thyroid disease Mother    Macular degeneration Mother     Social History   Tobacco Use   Smoking status: Never   Smokeless tobacco: Never  Vaping Use   Vaping Use: Never used  Substance Use Topics   Alcohol use: No   Drug use: No    Home Medications Prior to Admission medications   Medication Sig Start Date End Date Taking? Authorizing Provider  acetaminophen (TYLENOL) 650 MG CR tablet Take 650 mg by mouth every 8 (eight) hours as needed for pain.    [provider]  albuterol (VENTOLIN HFA) 108 (90 Base) MCG/ACT inhaler Inhale 2 puffs into the lungs every 6 (six) hours as needed for wheezing or shortness of breath. Patient not taking:  Reported on 12/19/2020    [provider]  Calcium Citrate-Vitamin D (CALCIUM CITRATE + D3 PO) Take 1 tablet by mouth at bedtime.    [provider]  celecoxib (CELEBREX) 200 MG capsule Take 200 mg by mouth daily. 11/15/19   [provider]  chlorpheniramine (CHLOR-TRIMETON) 4 MG tablet Take 4 mg by mouth 2 (two) times daily as needed for allergies. Patient not taking: Reported on 12/19/2020    [provider]  diclofenac Sodium (VOLTAREN) 1 % GEL Apply 1 application topically 4 (four) times daily as needed (pain).    [provider]  dronedarone (MULTAQ) 400 MG tablet Take 1 tablet (400 mg total) by mouth 2 (two) times daily with a meal. 08/28/20   O'Neal, Cassie Freer, MD  ELIQUIS 5 MG TABS tablet Take 1 tablet (5 mg total) by mouth 2 (two) times daily. 07/27/20   O'NealCassie Freer, MD  EPINEPHrine 0.3 mg/0.3 mL IJ SOAJ injection Inject 0.3 mg into the muscle as needed for anaphylaxis. Patient not taking: Reported on 12/19/2020    [provider]  [place] 300 MG CAPS Take 300 mg by mouth in the morning and at bedtime.    [provider]  HYDROcodone-acetaminophen (NORCO/VICODIN) 5-325 MG tablet Take 1 tablet by mouth every 8 (eight) hours as needed for moderate pain.    [provider]  lubiprostone (AMITIZA) 24 MCG capsule Take 24 mcg by mouth 2 (two) times daily with a meal.    [provider]  mometasone (NASONEX) 50 MCG/ACT nasal spray Place 1 spray into the nose in the morning and at bedtime. 09/08/19   [provider]  nebivolol (BYSTOLIC) 10 MG tablet Take 1 tablet (10 mg total) by mouth 2 (two) times daily. 12/22/20   O'NealCassie Freer, MD  Oxcarbazepine (TRILEPTAL) 300 MG tablet Take 300 mg by mouth 2 (two) times daily. 03/13/20   [provider]  OZEMPIC, 1 MG/DOSE, 4 MG/3ML SOPN Inject 1 mg into the muscle every Saturday. INJECT THE CONTENTS OF 1 PEN INJECTOR SUBQ ONCE WEEKLY 02/08/20    [provider]  pregabalin (LYRICA) 200 MG capsule Take 200 mg by mouth 2 (two) times daily.    [provider]  RELISTOR 150 MG TABS Take 3 tablets by mouth daily. 09/29/20   [provider]  rosuvastatin (CRESTOR) 20 MG tablet Take 1 tablet (20 mg total) by mouth daily. 07/27/20 10/25/20  O'NealCassie Freer, MD  sildenafil (VIAGRA) 100 MG tablet Take 100 mg by mouth daily as needed for erectile dysfunction.    [provider]  testosterone cypionate (DEPOTESTOSTERONE CYPIONATE) 200 MG/ML injection Inject 200 mg into the muscle every 21 ( twenty-one) days. 10/22/19   [provider]  valsartan (DIOVAN) 160 MG tablet Take 160 mg by mouth at bedtime.    [provider]    Allergies    Augmentin [amoxicillin-pot clavulanate], Fish allergy, Iodinated diagnostic agents, Ketorolac, Shellfish allergy, Toradol [ketorolac tromethamine], Atorvastatin, Guaifenesin, Penicillins, Statins, Alendronate sodium, Dextromethorphan, and Pseudoephedrine  Review of Systems   Review of Systems  Constitutional:  Negative for diaphoresis and fever.  HENT:  Negative for sore throat.   Eyes:  Negative for visual disturbance.  Respiratory:  Negative for cough and shortness of breath.   Cardiovascular:  Positive for chest pain and leg swelling.  Gastrointestinal:  Positive for nausea. Negative for abdominal pain, heartburn and vomiting.  Genitourinary:  Negative for dysuria.  Musculoskeletal:  Negative for neck pain.  Skin:  Negative for rash.  Neurological:  Negative for dizziness.   Physical Exam Updated Vital Signs BP 126/63   Pulse 64   Temp (!) 97.5 F (36.4 C)   Resp 16   SpO2 93%   Physical Exam Vitals and nursing note reviewed.  Constitutional:      General: He is not in acute distress.    Appearance: He is well-developed.  HENT:     Head: Normocephalic and atraumatic.  Eyes:     Conjunctiva/sclera: Conjunctivae normal.  Cardiovascular:      Rate and Rhythm: Normal rate and regular rhythm.     Heart sounds: Normal heart sounds. No murmur heard. Pulmonary:     Effort: Pulmonary effort is normal. No respiratory distress.     Breath sounds: Normal breath sounds.  Abdominal:     Palpations: Abdomen is soft.     Tenderness: There is no abdominal tenderness. There is no guarding or rebound.  Musculoskeletal:        General: No swelling. Normal range of motion.     Cervical back: Neck supple.     Right lower leg: No tenderness. Edema present.     Left lower leg: No tenderness. Edema present.  Skin:    General: Skin is warm and dry.     Capillary Refill: Capillary refill takes less than 2 seconds.  Neurological:     General: No focal deficit present.     Mental Status: He is alert.  Psychiatric:        Mood and Affect: Mood normal.    ED Results / Procedures / Treatments   Labs (all labs ordered are listed, but only abnormal results are displayed) Labs Reviewed  BASIC METABOLIC PANEL - Abnormal; Notable for the following components:      Result Value   Glucose, Bld 106 (*)    All other components within normal limits  CBC  TROPONIN I (HIGH SENSITIVITY)  TROPONIN I (HIGH SENSITIVITY)    EKG None Done that did not cross into epic.  Please see clinical course for interpretation Radiology DG Chest 2 View  Result Date: 05/26/2021 CLINICAL DATA:  Chest pain EXAM: CHEST - 2 VIEW COMPARISON:  05/25/2021 FINDINGS: The heart size and mediastinal contours are within normal limits. Both lungs are clear. No pleural effusion or pneumothorax. The visualized skeletal structures are unremarkable. IMPRESSION: No acute process in the chest. Electronically Signed   By: Macy Mis M.D.   On: 05/26/2021 14:37   DG Chest 2 View  Result Date: 05/25/2021 CLINICAL DATA:  Chest pain. EXAM: CHEST -  2 VIEW COMPARISON:  Chest x-ray 11/03/2019. FINDINGS: The heart size and mediastinal contours are within normal limits. Both lungs are clear.  The visualized skeletal structures are unremarkable. IMPRESSION: No active cardiopulmonary disease. Electronically Signed   By: Ronney Asters M.D.   On: 05/25/2021 21:38    Procedures Procedures   Medications Ordered in ED Medications - No data to display  ED Course  I have reviewed the triage vital signs and the nursing notes.  Pertinent labs & imaging results that were available during my care of the patient were reviewed by me and considered in my medical decision making (see chart for details).  Clinical Course as of 05/27/21 0946  Sat May 26, 2021  1535 EKG not crossing into epic.  Normal sinus rhythm rate of 70 normal intervals no acute ST-T changes.  Similar to prior EKG yesterday. [MB]  2376 Reviewed results of work-up with him.  He appears very comfortable and his labs do not show any acute process going on at this time.  He is comfortable plan for outpatient follow-up with his treating providers.  Return instructions discussed [MB]    Clinical Course User Index [MB] Hayden Rasmussen, MD   MDM Rules/Calculators/A&P                          This patient complains of chest pain that started yesterday improving today but still present; this involves an extensive number of treatment Options and is a complaint that carries with it a high risk of complications and Morbidity. The differential includes ACS, PE, pneumothorax, vascular, musculoskeletal, reflux  I ordered, reviewed and interpreted labs, which included CBC with normal white count normal hemoglobin, chemistries normal, troponins flat  I ordered imaging studies which included chest x-ray and I independently    visualized and interpreted imaging which showed no acute findings Previous records obtained and reviewed in epic including prior labs EKG and chest x-[facility] done [facility] at [facility]  After the interventions stated above, I reevaluated the patient and found patient is minimally symptomatic.  He feels this is all  muscular pain.  No evidence of ACS and is already on anticoagulation making PE less likely.  No evidence of hemodynamic instability.  Recommended symptomatic treatment and follow-up with his primary care doctor.  Return instructions discussed   Final Clinical Impression(s) / ED Diagnoses Final diagnoses:  Nonspecific chest pain    Rx / DC Orders ED Discharge Orders     None        Hayden Rasmussen, MD 05/27/21 [phone]

## 2021-05-26 NOTE — ED Notes (Signed)
Patient left on own accord °

## 2021-05-26 NOTE — Discharge Instructions (Signed)
You were seen in the emergency department for evaluation of chest pain.  You had blood work EKG and a chest x-ray that did not show any obvious explanation for your symptoms.  Please contact your primary care doctor for close follow-up.  Return to the emergency department if any worsening or concerning symptoms

## 2021-06-04 ENCOUNTER — Other Ambulatory Visit: Payer: Self-pay | Admitting: Cardiovascular Disease

## 2021-06-04 DIAGNOSIS — Z79899 Other long term (current) drug therapy: Secondary | ICD-10-CM | POA: Diagnosis not present

## 2021-06-04 DIAGNOSIS — N401 Enlarged prostate with lower urinary tract symptoms: Secondary | ICD-10-CM | POA: Diagnosis not present

## 2021-06-04 DIAGNOSIS — E291 Testicular hypofunction: Secondary | ICD-10-CM | POA: Diagnosis not present

## 2021-06-04 NOTE — Telephone Encounter (Signed)
Prescription refill request for Eliquis received. Indication:Afib Last office visit:5/22 Scr:1.1 Age: 65 Weight:149.2 kg  Prescription refilled

## 2021-06-12 ENCOUNTER — Other Ambulatory Visit: Payer: Self-pay | Admitting: Cardiovascular Disease

## 2021-06-12 DIAGNOSIS — E291 Testicular hypofunction: Secondary | ICD-10-CM | POA: Diagnosis not present

## 2021-06-17 DIAGNOSIS — E782 Mixed hyperlipidemia: Secondary | ICD-10-CM | POA: Diagnosis not present

## 2021-06-17 DIAGNOSIS — E1140 Type 2 diabetes mellitus with diabetic neuropathy, unspecified: Secondary | ICD-10-CM | POA: Diagnosis not present

## 2021-06-17 DIAGNOSIS — R69 Illness, unspecified: Secondary | ICD-10-CM | POA: Diagnosis not present

## 2021-06-17 DIAGNOSIS — E1159 Type 2 diabetes mellitus with other circulatory complications: Secondary | ICD-10-CM | POA: Diagnosis not present

## 2021-06-17 DIAGNOSIS — F319 Bipolar disorder, unspecified: Secondary | ICD-10-CM | POA: Diagnosis not present

## 2021-06-19 DIAGNOSIS — M5451 Vertebrogenic low back pain: Secondary | ICD-10-CM | POA: Diagnosis not present

## 2021-06-21 DIAGNOSIS — L209 Atopic dermatitis, unspecified: Secondary | ICD-10-CM | POA: Diagnosis not present

## 2021-06-25 DIAGNOSIS — M5451 Vertebrogenic low back pain: Secondary | ICD-10-CM | POA: Diagnosis not present

## 2021-06-26 DIAGNOSIS — R69 Illness, unspecified: Secondary | ICD-10-CM | POA: Diagnosis not present

## 2021-06-26 DIAGNOSIS — F603 Borderline personality disorder: Secondary | ICD-10-CM | POA: Diagnosis not present

## 2021-06-29 DIAGNOSIS — M5451 Vertebrogenic low back pain: Secondary | ICD-10-CM | POA: Diagnosis not present

## 2021-07-03 DIAGNOSIS — M5451 Vertebrogenic low back pain: Secondary | ICD-10-CM | POA: Diagnosis not present

## 2021-07-06 DIAGNOSIS — M5451 Vertebrogenic low back pain: Secondary | ICD-10-CM | POA: Diagnosis not present

## 2021-07-10 ENCOUNTER — Telehealth: Payer: Self-pay

## 2021-07-10 DIAGNOSIS — M5451 Vertebrogenic low back pain: Secondary | ICD-10-CM | POA: Diagnosis not present

## 2021-07-10 NOTE — Telephone Encounter (Signed)
° °  Pre-operative Risk Assessment    Patient Name: Peter Buck  DOB: 13-Jun-1956 MRN: [id number]      Request for Surgical Clearance    Procedure:   Lumbar injection to help alleviate pain .  Date of Surgery:  Clearance TBD                                 Surgeon:  None listed. Surgeon's Group or Practice Name:  [facility] Phone number:  [phone] Fax number:  [phone]   Type of Clearance Requested:   - Pharmacy:  Hold Apixaban (Eliquis) hold for 3 days prior to the procedure.   Type of Anesthesia:  Not Indicated   Additional requests/questions:  Please advise surgeon/provider what medications should be held.  Kathrine Cords   07/10/2021, 4:23 PM

## 2021-07-11 DIAGNOSIS — M8589 Other specified disorders of bone density and structure, multiple sites: Secondary | ICD-10-CM | POA: Diagnosis not present

## 2021-07-11 DIAGNOSIS — M810 Age-related osteoporosis without current pathological fracture: Secondary | ICD-10-CM | POA: Diagnosis not present

## 2021-07-11 DIAGNOSIS — E291 Testicular hypofunction: Secondary | ICD-10-CM | POA: Diagnosis not present

## 2021-07-11 NOTE — Telephone Encounter (Addendum)
Patient with diagnosis of afib on Eliquis for anticoagulation.    Procedure: lumbar injection Date of procedure: TBD  CHA2DS2-VASc Score = 5  This indicates a 7.2% annual risk of stroke. The patient's score is based upon: CHF History: 0 HTN History: 1 Diabetes History: 1 Stroke History: 2 (reports possibly had a TIA in the past) Vascular Disease History: 0 Age Score: 1 Gender Score: 0   CrCl > 121mL/min Platelet count 192K  Per office protocol, patient can hold Eliquis for 3 days prior to procedure. Resume as soon as safely possible afterwards.

## 2021-07-16 DIAGNOSIS — G8929 Other chronic pain: Secondary | ICD-10-CM | POA: Diagnosis not present

## 2021-07-16 DIAGNOSIS — E1142 Type 2 diabetes mellitus with diabetic polyneuropathy: Secondary | ICD-10-CM | POA: Diagnosis not present

## 2021-07-16 DIAGNOSIS — M25561 Pain in right knee: Secondary | ICD-10-CM | POA: Diagnosis not present

## 2021-07-16 DIAGNOSIS — M5442 Lumbago with sciatica, left side: Secondary | ICD-10-CM | POA: Diagnosis not present

## 2021-07-16 DIAGNOSIS — M5412 Radiculopathy, cervical region: Secondary | ICD-10-CM | POA: Diagnosis not present

## 2021-07-16 DIAGNOSIS — Z7984 Long term (current) use of oral hypoglycemic drugs: Secondary | ICD-10-CM | POA: Diagnosis not present

## 2021-07-16 DIAGNOSIS — Z79891 Long term (current) use of opiate analgesic: Secondary | ICD-10-CM | POA: Diagnosis not present

## 2021-07-16 DIAGNOSIS — K5903 Drug induced constipation: Secondary | ICD-10-CM | POA: Diagnosis not present

## 2021-07-16 DIAGNOSIS — M25572 Pain in left ankle and joints of left foot: Secondary | ICD-10-CM | POA: Diagnosis not present

## 2021-07-16 DIAGNOSIS — T402X5A Adverse effect of other opioids, initial encounter: Secondary | ICD-10-CM | POA: Diagnosis not present

## 2021-07-17 DIAGNOSIS — M5451 Vertebrogenic low back pain: Secondary | ICD-10-CM | POA: Diagnosis not present

## 2021-07-18 DIAGNOSIS — F319 Bipolar disorder, unspecified: Secondary | ICD-10-CM | POA: Diagnosis not present

## 2021-07-18 DIAGNOSIS — E1140 Type 2 diabetes mellitus with diabetic neuropathy, unspecified: Secondary | ICD-10-CM | POA: Diagnosis not present

## 2021-07-18 DIAGNOSIS — E782 Mixed hyperlipidemia: Secondary | ICD-10-CM | POA: Diagnosis not present

## 2021-07-18 DIAGNOSIS — R69 Illness, unspecified: Secondary | ICD-10-CM | POA: Diagnosis not present

## 2021-07-18 DIAGNOSIS — E1159 Type 2 diabetes mellitus with other circulatory complications: Secondary | ICD-10-CM | POA: Diagnosis not present

## 2021-07-25 DIAGNOSIS — H52209 Unspecified astigmatism, unspecified eye: Secondary | ICD-10-CM | POA: Diagnosis not present

## 2021-07-25 DIAGNOSIS — H5213 Myopia, bilateral: Secondary | ICD-10-CM | POA: Diagnosis not present

## 2021-07-25 DIAGNOSIS — H524 Presbyopia: Secondary | ICD-10-CM | POA: Diagnosis not present

## 2021-08-09 DIAGNOSIS — E291 Testicular hypofunction: Secondary | ICD-10-CM | POA: Diagnosis not present

## 2021-08-10 ENCOUNTER — Ambulatory Visit: Payer: Medicare HMO | Admitting: Sports Medicine

## 2021-08-10 DIAGNOSIS — Z961 Presence of intraocular lens: Secondary | ICD-10-CM | POA: Diagnosis not present

## 2021-08-11 DIAGNOSIS — H52209 Unspecified astigmatism, unspecified eye: Secondary | ICD-10-CM | POA: Diagnosis not present

## 2021-08-11 DIAGNOSIS — H524 Presbyopia: Secondary | ICD-10-CM | POA: Diagnosis not present

## 2021-08-11 DIAGNOSIS — H5213 Myopia, bilateral: Secondary | ICD-10-CM | POA: Diagnosis not present

## 2021-08-14 ENCOUNTER — Other Ambulatory Visit: Payer: Self-pay

## 2021-08-14 ENCOUNTER — Ambulatory Visit: Payer: Medicare HMO | Admitting: Sports Medicine

## 2021-08-14 ENCOUNTER — Encounter: Payer: Self-pay | Admitting: Sports Medicine

## 2021-08-14 DIAGNOSIS — I739 Peripheral vascular disease, unspecified: Secondary | ICD-10-CM

## 2021-08-14 DIAGNOSIS — G629 Polyneuropathy, unspecified: Secondary | ICD-10-CM

## 2021-08-14 DIAGNOSIS — M2141 Flat foot [pes planus] (acquired), right foot: Secondary | ICD-10-CM

## 2021-08-14 DIAGNOSIS — B351 Tinea unguium: Secondary | ICD-10-CM

## 2021-08-14 DIAGNOSIS — M2042 Other hammer toe(s) (acquired), left foot: Secondary | ICD-10-CM

## 2021-08-14 DIAGNOSIS — M79674 Pain in right toe(s): Secondary | ICD-10-CM

## 2021-08-14 DIAGNOSIS — M810 Age-related osteoporosis without current pathological fracture: Secondary | ICD-10-CM | POA: Insufficient documentation

## 2021-08-14 DIAGNOSIS — M79675 Pain in left toe(s): Secondary | ICD-10-CM

## 2021-08-14 DIAGNOSIS — M2041 Other hammer toe(s) (acquired), right foot: Secondary | ICD-10-CM

## 2021-08-14 DIAGNOSIS — E088 Diabetes mellitus due to underlying condition with unspecified complications: Secondary | ICD-10-CM

## 2021-08-14 DIAGNOSIS — M2142 Flat foot [pes planus] (acquired), left foot: Secondary | ICD-10-CM

## 2021-08-14 NOTE — Progress Notes (Signed)
Subjective: Peter Buck is a 66 y.o. male patient with history of diabetes who presents to office today complaining of long,mildly painful nails  while ambulating in shoes; unable to trim. Patient reports that he has missed a few appointments.  Fasting blood sugar does not check A1c 6.4 which has increased from past PCP Dr. Nyra Capes 3 months ago  Patient Active Problem List   Diagnosis Date Noted   Age related osteoporosis 08/14/2021   Chronic, continuous use of opioids 05/01/2021   Chronic venous insufficiency 12/19/2020   Localized, primary osteoarthritis of hand 11/24/2020   Pain in right hand 11/24/2020   Diabetic polyneuropathy associated with type 2 diabetes mellitus ([place]) 10/31/2020   Preoperative evaluation to rule out surgical contraindication 07/25/2020   Morbid obesity ([place]) 04/11/2020   Anemia    Arthritis    Asthma    Bipolar disorder ([place])    Diabetes mellitus without complication ([place])    GERD (gastroesophageal reflux disease)    Hypertension    Sleep apnea    Paroxysmal atrial fibrillation ([place]) 12/23/2019   Essential hypertension 11/02/2019   Diabetes mellitus due to underlying condition with unspecified complications ([place]) 12/75/1700   Chronic pain of left knee 10/19/2019   Primary osteoarthritis of both first carpometacarpal joints 08/25/2019   Second degree burn of finger of right hand 08/25/2019   [place] 08/25/2019   Therapeutic opioid induced constipation 06/22/2019   Cervical radiculopathy 02/02/2019   Chronic pain syndrome 02/02/2019   Facet arthropathy, cervical 02/02/2019   Foraminal stenosis of cervical region 02/02/2019   Neuroforaminal stenosis of lumbar spine 12/17/2018   Pain medication agreement 10/16/2018   Pain associated with accessory navicular bone of foot, left 05/25/2018   Aftercare following surgery 04/23/2018   Muscle spasm of back 02/17/2018   DDD (degenerative disc disease), lumbar 11/25/2017   Low back pain 11/25/2017   Body  mass index (BMI) 38.0-38.9, adult 09/16/2017   Plantar fasciitis 01/02/2017   Arthritis, midfoot 12/05/2016   Borderline personality disorder ([place]) 04/29/2016   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   Bipolar affective disorder, depressed ([place]) 02/13/2016   Comprehensive diabetic foot examination, type 2 DM, encounter for ([place]) 12/25/2015   Onychomycosis due to dermatophyte 12/25/2015   Hammertoe 04/26/2014   Current Outpatient Medications on File Prior to Visit  Medication Sig Dispense Refill   fluticasone (FLONASE) 50 MCG/ACT nasal spray      naloxegol oxalate (MOVANTIK) 25 MG TABS tablet Take by mouth.     pregabalin (LYRICA) 150 MG capsule Take by mouth.     acetaminophen (TYLENOL) 650 MG CR tablet Take 650 mg by mouth every 8 (eight) hours as needed for pain.     Calcium Citrate-Vitamin D (CALCIUM CITRATE + D3 PO) Take 1 tablet by mouth at bedtime.     celecoxib (CELEBREX) 200 MG capsule Take 200 mg by mouth daily.     cetirizine (ZYRTEC) 10 MG tablet      diclofenac Sodium (VOLTAREN) 1 % GEL Apply topically.     dimenhyDRINATE (DRAMAMINE) 50 MG tablet      ELIQUIS 5 MG TABS tablet TAKE 1 TABLET BY MOUTH TWICE A DAY 180 tablet 3   fexofenadine (ALLEGRA) 180 MG tablet Take by mouth.     [place] 300 MG CAPS Take 300 mg by mouth in the morning and at bedtime.     HYDROcodone-acetaminophen (NORCO/VICODIN) 5-325 MG tablet Take 1 tablet by mouth every 8 (eight) hours as needed for moderate pain.     Inulin 2  g CHEW daily.     ketotifen (ZADITOR) 0.025 % ophthalmic solution Apply to eye.     lovastatin (MEVACOR) 10 MG tablet Take by mouth.     lubiprostone (AMITIZA) 24 MCG capsule Take 24 mcg by mouth 2 (two) times daily with a meal.     meclizine (ANTIVERT) 25 MG tablet take 1 tablet qd prn dizziness     methylPREDNISolone (MEDROL DOSEPAK) 4 MG TBPK tablet See admin instructions. see package     mometasone (NASONEX) 50 MCG/ACT nasal spray daily (at the same time every day).      montelukast (SINGULAIR) 10 MG tablet Take 10 mg by mouth daily.     MULTAQ 400 MG tablet TAKE 1 TABLET (400 MG TOTAL) BY MOUTH 2 (TWO) TIMES DAILY WITH A MEAL. 180 tablet 1   nebivolol (BYSTOLIC) 10 MG tablet Take 1 tablet (10 mg total) by mouth 2 (two) times daily. 180 tablet 2   Oxcarbazepine (TRILEPTAL) 300 MG tablet Take 300 mg by mouth 2 (two) times daily.     OZEMPIC, 1 MG/DOSE, 4 MG/3ML SOPN Inject 1 mg into the muscle every Saturday. INJECT THE CONTENTS OF 1 PEN INJECTOR SUBQ ONCE WEEKLY     pramoxine (PROCTOFOAM) 1 % foam See admin instructions.     RELISTOR 150 MG TABS Take 3 tablets by mouth daily.     rosuvastatin (CRESTOR) 20 MG tablet TAKE 1 TABLET BY MOUTH EVERY DAY 90 tablet 3   sildenafil (VIAGRA) 100 MG tablet Take by mouth.     testosterone cypionate (DEPOTESTOSTERONE CYPIONATE) 200 MG/ML injection Inject 200 mg into the muscle every 21 ( twenty-one) days.     trimethoprim-polymyxin b (POLYTRIM) ophthalmic solution SMARTSIG:In Eye(s)     valsartan (DIOVAN) 160 MG tablet Take 160 mg by mouth at bedtime.     No current facility-administered medications on file prior to visit.   Allergies  Allergen Reactions   Augmentin [Amoxicillin-Pot Clavulanate] Anaphylaxis   Fish Allergy Anaphylaxis    Specifically Salmon   Iodinated Contrast Media Anaphylaxis, Hives, Other (See Comments) and Rash    Severe hypotension Developed reaction a day and a half after a lumbar nerve root block on 09/22/17.  Treated by dermatologist with prednisone taper and antihistamines. Developed reaction a day and a half after a lumbar nerve root block on 09/22/17.  Treated by dermatologist with prednisone taper and antihistamines. Developed reaction a day and a half after a lumbar nerve root block on 09/22/17.  Treated by dermatologist with prednisone taper and antihistamines.    Ketorolac Anaphylaxis   Shellfish Allergy Anaphylaxis   Toradol [Ketorolac Tromethamine] Anaphylaxis    "It put me in shock.  My  blood pressure tanked and I was itchy all over; had to go to the ER."   Atorvastatin Other (See Comments)    Severe back ache Severe back ache Severe back ache Severe back ache   Guaifenesin Other (See Comments) and Swelling    "It sorta made my throat close up." "It sorta made my throat close up." "It sorta made my throat close up."   Penicillins Dermatitis   Statins Rash    Severe back ache Severe back ache Severe back ache Severe back ache Severe back ache Other reaction(s): Unknown   Alendronate Sodium Other (See Comments)    Unknown reaction    Dextromethorphan Other (See Comments)    Unknown reaction   [place]    Pseudoephedrine Other (See Comments)    "It dries me out.  I take it when my other allergy medicine isn't working."    Recent Results (from the past 2160 hour(s))  Basic metabolic panel     Status: None   Collection Time: 05/25/21  9:03 PM  Result Value Ref Range   Sodium 136 135 - 145 mmol/L   Potassium 4.6 3.5 - 5.1 mmol/L   Chloride 100 98 - 111 mmol/L   CO2 28 22 - 32 mmol/L   Glucose, Bld 99 70 - 99 mg/dL    Comment: Glucose reference range applies only to samples taken after fasting for at least 8 hours.   BUN 23 8 - 23 mg/dL   Creatinine, Ser 1.21 0.61 - 1.24 mg/dL   Calcium 9.5 8.9 - 10.3 mg/dL   GFR, Estimated >60 >60 mL/min    Comment: (NOTE) Calculated using the CKD-EPI Creatinine Equation (2021)    Anion gap 8 5 - 15    Comment: Performed at [facility] [address] [place], [place] [address]  CBC     Status: None   Collection Time: 05/25/21  9:03 PM  Result Value Ref Range   WBC 6.9 4.0 - 10.5 K/uL   RBC 5.34 4.22 - 5.81 MIL/uL   Hemoglobin 15.6 13.0 - 17.0 g/dL   HCT 49.3 39.0 - 52.0 %   MCV 92.3 80.0 - 100.0 fL   MCH 29.2 26.0 - 34.0 pg   MCHC 31.6 30.0 - 36.0 g/dL   RDW 13.2 11.5 - 15.5 %   Platelets 202 150 - 400 K/uL   nRBC 0.0 0.0 - 0.2 %    Comment: Performed at [facility], [place] [address]  [place], [place] [address]  Troponin I (High Sensitivity)     Status: None   Collection Time: 05/25/21 11:15 PM  Result Value Ref Range   Troponin I (High Sensitivity) 6 <18 ng/L    Comment: (NOTE) Elevated high sensitivity troponin I (hsTnI) values and significant  changes across serial measurements may suggest ACS but many other  chronic and acute conditions are known to elevate hsTnI results.  Refer to the "Links" section for chest pain algorithms and additional  guidance. Performed at [facility], [place] [address], [place], [place] [address]   Basic metabolic panel     Status: Abnormal   Collection Time: 05/26/21  1:16 PM  Result Value Ref Range   Sodium 137 135 - 145 mmol/L   Potassium 4.3 3.5 - 5.1 mmol/L   Chloride 100 98 - 111 mmol/L   CO2 32 22 - 32 mmol/L   Glucose, Bld 106 (H) 70 - 99 mg/dL    Comment: Glucose reference range applies only to samples taken after fasting for at least 8 hours.   BUN 19 8 - 23 mg/dL   Creatinine, Ser 1.15 0.61 - 1.24 mg/dL   Calcium 10.0 8.9 - 10.3 mg/dL   GFR, Estimated >60 >60 mL/min    Comment: (NOTE) Calculated using the CKD-EPI Creatinine Equation (2021)    Anion gap 5 5 - 15    Comment: Performed at [facility], [address] [place], [place] [address]  CBC     Status: None   Collection Time: 05/26/21  1:16 PM  Result Value Ref Range   WBC 6.5 4.0 - 10.5 K/uL   RBC 5.58 4.22 - 5.81 MIL/uL   Hemoglobin 15.9 13.0 - 17.0 g/dL   HCT 49.7 39.0 - 52.0 %   MCV 89.1 80.0 -  100.0 fL   MCH 28.5 26.0 - 34.0 pg   MCHC 32.0 30.0 - 36.0 g/dL   RDW 13.2 11.5 - 15.5 %   Platelets 192 150 - 400 K/uL   nRBC 0.0 0.0 - 0.2 %    Comment: Performed at [facility], [address] [place], [place] [address]  Troponin I (High Sensitivity)     Status: None   Collection Time: 05/26/21  1:16 PM  Result Value Ref Range   Troponin I (High Sensitivity) 5 <18 ng/L    Comment: (NOTE) Elevated high sensitivity  troponin I (hsTnI) values and significant  changes across serial measurements may suggest ACS but many other  chronic and acute conditions are known to elevate hsTnI results.  Refer to the "Links" section for chest pain algorithms and additional  guidance. Performed at [facility], [place], [place] [address]   Troponin I (High Sensitivity)     Status: None   Collection Time: 05/26/21  4:00 PM  Result Value Ref Range   Troponin I (High Sensitivity) 4 <18 ng/L    Comment: (NOTE) Elevated high sensitivity troponin I (hsTnI) values and significant  changes across serial measurements may suggest ACS but many other  chronic and acute conditions are known to elevate hsTnI results.  Refer to the "Links" section for chest pain algorithms and additional  guidance. Performed at [facility], [address] [place],  [address]     Objective: General: Patient is awake, alert, and oriented x 3 and in no acute distress.  Integument: Skin is warm, dry and supple bilateral. Nails are tender, long, thickened and dystrophic with subungual debris, consistent with onychomycosis, 1-5 bilateral.  There is dry heme noted to the left second and third toenails from the nails growing out excessively long and getting pressure underneath the nailbed there is no sign of detachment of nail or signs of infection.  Dry skin bilateral.  No open lesions or preulcerative lesions present bilateral. Remaining integument unremarkable.  Vasculature:  Dorsalis Pedis pulse 1/4 bilateral. Posterior Tibial pulse  0/4 bilateral due to edema. Capillary fill time <5 sec 1-5 bilateral.  Minimal hair growth noted bilateral.  Mild varicosities present bilateral ankles with severe varicosities noted in upper thighs and legs as previously documented.  1+ pitting edema noted bilateral.  Neurology: Protective sensation intact vibratory sensation diminished bilateral.  No  Babinski sign present bilateral.   Musculoskeletal: Asymptomatic pes planus and hammertoe deformity noted bilateral. There is limited ankle joint range of motion bilateral consistent with equinus deformity.  No tenderness with calf compression bilateral.  Assessment and Plan: Problem List Items Addressed This Visit       Endocrine   Diabetes mellitus due to underlying condition with unspecified complications ([place])   Relevant Medications   lovastatin (MEVACOR) 10 MG tablet     Musculoskeletal and Integument   Hammertoe   Other Visit Diagnoses     Pain due to onychomycosis of toenails of both feet    -  Primary   Neuropathy       PVD (peripheral vascular disease) ([facility])       Relevant Medications   lovastatin (MEVACOR) 10 MG tablet   sildenafil (VIAGRA) 100 MG tablet   Pes planus of both feet          -Examined patient. -Discussed with patient the importance of daily foot inspection in the setting of diabetes -Mechanically debrided all nails 1-5 bilateral using sterile nail nipper  and filed with dremel without incident  -Continue with diabetic shoes that provide support and do not rub toes -Return to office in 3 months for at risk foot care or sooner if problems or issues arise.  Landis Martins, DPM

## 2021-08-15 DIAGNOSIS — R69 Illness, unspecified: Secondary | ICD-10-CM | POA: Diagnosis not present

## 2021-08-15 DIAGNOSIS — F319 Bipolar disorder, unspecified: Secondary | ICD-10-CM | POA: Diagnosis not present

## 2021-08-15 DIAGNOSIS — E782 Mixed hyperlipidemia: Secondary | ICD-10-CM | POA: Diagnosis not present

## 2021-08-15 DIAGNOSIS — E1159 Type 2 diabetes mellitus with other circulatory complications: Secondary | ICD-10-CM | POA: Diagnosis not present

## 2021-08-15 DIAGNOSIS — E1140 Type 2 diabetes mellitus with diabetic neuropathy, unspecified: Secondary | ICD-10-CM | POA: Diagnosis not present

## 2021-08-21 DIAGNOSIS — R69 Illness, unspecified: Secondary | ICD-10-CM | POA: Diagnosis not present

## 2021-08-21 DIAGNOSIS — F603 Borderline personality disorder: Secondary | ICD-10-CM | POA: Diagnosis not present

## 2021-08-26 DIAGNOSIS — I10 Essential (primary) hypertension: Secondary | ICD-10-CM | POA: Diagnosis not present

## 2021-08-26 DIAGNOSIS — Z7901 Long term (current) use of anticoagulants: Secondary | ICD-10-CM | POA: Diagnosis not present

## 2021-08-26 DIAGNOSIS — I482 Chronic atrial fibrillation, unspecified: Secondary | ICD-10-CM | POA: Diagnosis not present

## 2021-08-26 DIAGNOSIS — R42 Dizziness and giddiness: Secondary | ICD-10-CM | POA: Diagnosis not present

## 2021-08-26 DIAGNOSIS — Z9049 Acquired absence of other specified parts of digestive tract: Secondary | ICD-10-CM | POA: Diagnosis not present

## 2021-08-26 DIAGNOSIS — R2681 Unsteadiness on feet: Secondary | ICD-10-CM | POA: Diagnosis not present

## 2021-08-26 DIAGNOSIS — E7800 Pure hypercholesterolemia, unspecified: Secondary | ICD-10-CM | POA: Diagnosis not present

## 2021-08-26 DIAGNOSIS — E279 Disorder of adrenal gland, unspecified: Secondary | ICD-10-CM | POA: Diagnosis not present

## 2021-08-26 DIAGNOSIS — N3289 Other specified disorders of bladder: Secondary | ICD-10-CM | POA: Diagnosis not present

## 2021-08-26 DIAGNOSIS — K219 Gastro-esophageal reflux disease without esophagitis: Secondary | ICD-10-CM | POA: Diagnosis not present

## 2021-08-26 DIAGNOSIS — N200 Calculus of kidney: Secondary | ICD-10-CM | POA: Diagnosis not present

## 2021-08-26 DIAGNOSIS — N281 Cyst of kidney, acquired: Secondary | ICD-10-CM | POA: Diagnosis not present

## 2021-08-27 DIAGNOSIS — I152 Hypertension secondary to endocrine disorders: Secondary | ICD-10-CM | POA: Diagnosis not present

## 2021-08-27 DIAGNOSIS — I4891 Unspecified atrial fibrillation: Secondary | ICD-10-CM | POA: Diagnosis not present

## 2021-08-27 DIAGNOSIS — E1159 Type 2 diabetes mellitus with other circulatory complications: Secondary | ICD-10-CM | POA: Diagnosis not present

## 2021-08-28 ENCOUNTER — Ambulatory Visit: Payer: Medicare HMO | Admitting: Physician Assistant

## 2021-08-28 ENCOUNTER — Encounter: Payer: Self-pay | Admitting: Physician Assistant

## 2021-08-28 ENCOUNTER — Other Ambulatory Visit: Payer: Self-pay

## 2021-08-28 VITALS — BP 136/80 | HR 76 | Ht 76.0 in | Wt 332.0 lb

## 2021-08-28 DIAGNOSIS — Z7901 Long term (current) use of anticoagulants: Secondary | ICD-10-CM

## 2021-08-28 DIAGNOSIS — I480 Paroxysmal atrial fibrillation: Secondary | ICD-10-CM | POA: Diagnosis not present

## 2021-08-28 DIAGNOSIS — G4730 Sleep apnea, unspecified: Secondary | ICD-10-CM

## 2021-08-28 DIAGNOSIS — R42 Dizziness and giddiness: Secondary | ICD-10-CM | POA: Diagnosis not present

## 2021-08-28 DIAGNOSIS — R55 Syncope and collapse: Secondary | ICD-10-CM

## 2021-08-28 NOTE — Patient Instructions (Signed)
Medication Instructions:  ?No Changes ?*If you need a refill on your cardiac medications before your next appointment, please call your pharmacy* ? ? ?Lab Work: ?BMET, CBC,Magnesium, TSH ?If you have labs (blood work) drawn today and your tests are completely normal, you will receive your results only by: ?MyChart Message (if you have MyChart) OR ?A paper copy in the mail ?If you have any lab test that is abnormal or we need to change your treatment, we will call you to review the results. ? ? ?Testing/Procedures: ?Dear [name] ?You are scheduled for a TEE/Cardioversion/TEE Cardioversion on September 25, 2021 with Dr.Croitoru .  Please arrive at the [facility] (Main Entrance A) at [facility]: [address] [place], [place] [address] at 9 AM . (1 hour prior to procedure unless lab work is needed; if lab work is needed arrive 1.5 hours ahead) ? ?DIET: Nothing to eat or drink after midnight except a sip of water with medications (see medication instructions below) ? ?FYI: For your safety, and to allow [place] to monitor your vital signs accurately during the surgery/procedure we request that   ?if you have artificial nails, gel coating, SNS etc. Please have those removed prior to your surgery/procedure. Not having the nail coverings /polish removed may result in cancellation or delay of your surgery/procedure. ? ? ?Medication Instructions: ?Hold None ? ?Continue your anticoagulant: Eliquis ?You will need to continue your anticoagulant after your procedure until you  are told by your  ?Provider that it is safe to stop ? ? ?Labs: If patient is on Coumadin, patient needs pt/INR, CBC, BMET within 3 days (No pt/INR needed for patients taking Xarelto, Eliquis, Pradaxa) ?For patients receiving anesthesia for TEE and all Cardioversion patients: BMET, CBC within 1 week ? ?Come to: [address] ?(Lab option #1) Come to the lab at [facility] between the hours of 8:00 am and 4:30 pm. You do  not have to be fasting. ?(Lab option #2) Lab at an alternate location ?(Lab option #3) your lab work will be done at the hospital prior to your procedure - you will need to arrive 1 ? hours ahead of your procedure ? ?You must have a responsible person to drive you home and stay in the waiting area during your procedure. Failure to do so could result in cancellation. ? ?Interior and spatial designer cards. ? ?*Special Note: Every effort is made to have your procedure done on time. Occasionally there are emergencies that occur at the hospital that may cause delays. Please be patient if a delay does occur.   ? ? ?Follow-Up: ?At [facility], you and your health needs are our priority.  As part of our continuing mission to provide you with exceptional heart care, we have created designated Provider Care Teams.  These Care Teams include your primary Cardiologist (physician) and Advanced Practice Providers (APPs -  Physician Assistants and Nurse Practitioners) who all work together to provide you with the care you need, when you need it. ? ?We recommend signing up for the patient portal called "MyChart".  Sign up information is provided on this After Visit Summary.  MyChart is used to connect with patients for Virtual Visits (Telemedicine).  Patients are able to view lab/test results, encounter notes, upcoming appointments, etc.  Non-urgent messages can be sent to your provider as well.   ?To learn more about what you can do with MyChart, go to [email or web address].   ? ?Your next appointment:   ?  1 Week Prior to TEE/Cardioversion ? ?The format for your next appointment:   ?In Person ? ?Provider:   ?Eleonore Chiquito, MD ? ?:1}  ? ? ?  ?

## 2021-08-28 NOTE — Progress Notes (Addendum)
?Cardiology Office Note:   ? ?Date:  08/28/2021  ? ?ID:  Peter Buck, DOB March 02, 1956, MRN [id number] ? ?PCP:  [name], MD ?  ?[facility] Providers ?Cardiologist:  Evalina Field, MD { ? ?Referring MD: [name], MD  ? ?Chief Complaint  ?Patient presents with  ? Follow-up  ? Chest Pain  ?  Tightness.  ? Headache  ?  Sunday morning almost fell out.  ? Shortness of Breath  ? Edema  ?  Ankles.  ? ? ?History of Present Illness:   ? ?Peter Buck is a 66 y.o. male with a hx of persistent atrial fibrillation s/p DCCV, obesity, OSA, HTN, HLD.  He is anticoagulated with Eliquis.  He was last seen in clinic with Dr. [name] on 10/19/2020 and was maintaining sinus rhythm on multaq.  He is compliant on CPAP. ? ?He was seen in the ER 05/26/2021 for chest pain.  He initially presented to [facility] ED for chest pain but left without being seen.  He presented the following day to [place].  He ruled out with negative troponins and a nonischemic EKG.  He was discharged without admission. ? ?He is [place] and stands for 2 hrs to pray on sundays. This past Sunday, he became dizzy and had to "fight to stay conscious" with near syncope. He tried to eat food thinking it was hypoglycemia. He finally presented to UC on Sunday who sent him to the [facility] ER for Afib. He was seen in the ER without admission, discharged with instructions to see his cardiologist. Since then, he has felt increased fatigue. He has had no episodes of syncope.  ? ?He does report missing doses of eliquis. His last cardioversion was 07/05/20. He admits to forgetting his medications, including multaq and eliquis. He thinks he has missed medications about three times in the last 2 weeks, but could be more.  ? ?He denies recent illness or changes in his overall health history. ? ?Past Medical History:  ?Diagnosis Date  ? Aftercare following surgery 04/23/2018  ? Anemia   ? Arthritis   ? Arthritis, midfoot 12/05/2016  ? Asthma   ?  Bipolar affective disorder, depressed ([place]) 02/13/2016  ? Bipolar disorder ([place])   ? Body mass index (bmi) 38.0-38.9, adult 09/16/2017  ? Body mass index (BMI) 38.0-38.9, adult 09/16/2017  ? Formatting of this note might be different from the original. IMO 10/01 Updates  ? Borderline personality disorder ([place]) 04/29/2016  ? Cervical radiculopathy 02/02/2019  ? Last Assessment & Plan:  Formatting of this note might be different from the original. MRI of the cervical spine shows cervical spondylosis with facet arthrosis most notable for mild to moderate left neural foraminal stenosis at C3-4.  Per patient, nerve conduction studies showed right C7-8 radiculopathy.    Continue with current regimen as outlined in lumbar degenerative disc plan.  ? Chronic pain of left knee 10/19/2019  ? Chronic pain syndrome 02/02/2019  ? Comprehensive diabetic foot examination, type 2 DM, encounter for ([place]) 12/25/2015  ? DDD (degenerative disc disease), lumbar 11/25/2017  ? Last Assessment & Plan:  Formatting of this note might be different from the original. 66 year old male with chronic, predominantly left-sided low back pain with radiation into the left hip and groin.  Today we reviewed his recently updated lumbar MRI which shows impingement of the left L2 nerve root due to broad-based disc bulging lateralizing to the left and projecting into the far left lateral   ? Diabetes mellitus  due to underlying condition with unspecified complications ([place]) 5/88/5027  ? Diabetes mellitus without complication ([place])   ? Essential hypertension 11/02/2019  ? Facet arthropathy, cervical 02/02/2019  ? Foraminal stenosis of cervical region 02/02/2019  ? GERD (gastroesophageal reflux disease)   ? Hammertoe 04/26/2014  ? Hypertension   ? 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 Low back pain 11/25/2017  ? Last Assessment & Plan:  Simpson Paulos is a 66 y.o. male with Low back pain, and acute on chronic low back pain with left hip, knee, and foot pain.  Today I will treat with  the patient's current medication of tramadol and diclofenac and add relafen (temporarily d/c diclofenac) for 10 days and increase lyrica. He will continue with his current appt with his neurosurgery.  Mr. Divis will return to   ? Muscle spasm of back 02/17/2018  ? Last Assessment & Plan:  Formatting of this note might be different from the original. Continue with tizanidine 4 mg as needed for muscle spasming up to 3 times daily.  ? Neuroforaminal stenosis of lumbar spine 12/17/2018  ? Onychomycosis due to dermatophyte 12/25/2015  ? Pain associated with accessory navicular bone of foot, left 05/25/2018  ? Pain medication agreement 10/16/2018  ? Last Assessment & Plan:  Formatting of this note might be different from the original. Review of prior UDS results are within normal limits.  [place] controlled substance registry reviewed and there were no inconsistencies noted.  ? Paroxysmal atrial fibrillation ([place]) 12/23/2019  ? Plantar fasciitis 01/02/2017  ? Primary osteoarthritis of both first carpometacarpal joints 08/25/2019  ? Second degree burn of finger of right hand 08/25/2019  ? Sleep apnea   ? Therapeutic opioid induced constipation 06/22/2019  ? Last Assessment & Plan:  Formatting of this note might be different from the original. Continue amitiza 48mg b.i.d.  ? Verruca 08/25/2019  ? ? ?Past Surgical History:  ?Procedure Laterality Date  ? APPENDECTOMY    ? CARDIOVERSION N/A 07/05/2020  ? Procedure: CARDIOVERSION;  Surgeon: OGeralynn Rile MD;  [facility]  Service: Cardiovascular;  Laterality: N/A;  ? CHOLECYSTECTOMY    ? INNER EAR SURGERY    ? KNEE ARTHROSCOPY    ? ? ?Current Medications: ?Current Meds  ?Medication Sig  ? acetaminophen (TYLENOL) 650 MG CR tablet Take 650 mg by mouth every 8 (eight) hours as needed for pain.  ? Calcium Citrate-Vitamin D (CALCIUM CITRATE + D3 PO) Take 1 tablet by mouth at bedtime.  ? celecoxib (CELEBREX) 200 MG capsule Take 200 mg by mouth daily.  ? cetirizine  (ZYRTEC) 10 MG tablet   ? diclofenac Sodium (VOLTAREN) 1 % GEL Apply topically.  ? dimenhyDRINATE (DRAMAMINE) 50 MG tablet   ? ELIQUIS 5 MG TABS tablet TAKE 1 TABLET BY MOUTH TWICE A DAY  ? fexofenadine (ALLEGRA) 180 MG tablet Take by mouth.  ? fluticasone (FLONASE) 50 MCG/ACT nasal spray   ? Horse Chestnut 300 MG CAPS Take 300 mg by mouth in the morning and at bedtime.  ? HYDROcodone-acetaminophen (NORCO/VICODIN) 5-325 MG tablet Take 1 tablet by mouth every 8 (eight) hours as needed for moderate pain.  ? Inulin 2 g CHEW daily.  ? ketotifen (ZADITOR) 0.025 % ophthalmic solution Apply to eye.  ? lovastatin (MEVACOR) 10 MG tablet Take by mouth.  ? lubiprostone (AMITIZA) 24 MCG capsule Take 24 mcg by mouth 2 (two) times daily with a meal.  ? meclizine (ANTIVERT) 25 MG tablet take 1 tablet qd prn dizziness  ?  methylPREDNISolone (MEDROL DOSEPAK) 4 MG TBPK tablet See admin instructions. see package  ? mometasone (NASONEX) 50 MCG/ACT nasal spray daily (at the same time every day).  ? montelukast (SINGULAIR) 10 MG tablet Take 10 mg by mouth daily.  ? MULTAQ 400 MG tablet TAKE 1 TABLET (400 MG TOTAL) BY MOUTH 2 (TWO) TIMES DAILY WITH A MEAL.  ? naloxegol oxalate (MOVANTIK) 25 MG TABS tablet Take by mouth.  ? nebivolol (BYSTOLIC) 10 MG tablet Take 1 tablet (10 mg total) by mouth 2 (two) times daily.  ? Oxcarbazepine (TRILEPTAL) 300 MG tablet Take 300 mg by mouth 2 (two) times daily.  ? OZEMPIC, 1 MG/DOSE, 4 MG/3ML SOPN Inject 1 mg into the muscle every Saturday. INJECT THE CONTENTS OF 1 PEN INJECTOR SUBQ ONCE WEEKLY  ? pramoxine (PROCTOFOAM) 1 % foam See admin instructions.  ? RELISTOR 150 MG TABS Take 3 tablets by mouth daily.  ? rosuvastatin (CRESTOR) 20 MG tablet TAKE 1 TABLET BY MOUTH EVERY DAY  ? sildenafil (VIAGRA) 100 MG tablet Take by mouth.  ? testosterone cypionate (DEPOTESTOSTERONE CYPIONATE) 200 MG/ML injection Inject 200 mg into the muscle every 21 ( twenty-one) days.  ? trimethoprim-polymyxin b (POLYTRIM)  ophthalmic solution SMARTSIG:In Eye(s)  ? valsartan (DIOVAN) 160 MG tablet Take 160 mg by mouth at bedtime.  ?  ? ?Allergies:   Augmentin [amoxicillin-pot clavulanate], Fish allergy, Iodinated contrast media, Gurney Maxin

## 2021-08-29 LAB — BASIC METABOLIC PANEL
BUN/Creatinine Ratio: 16 (ref 10–24)
BUN: 18 mg/dL (ref 8–27)
CO2: 26 mmol/L (ref 20–29)
Calcium: 10 mg/dL (ref 8.6–10.2)
Chloride: 101 mmol/L (ref 96–106)
Creatinine, Ser: 1.13 mg/dL (ref 0.76–1.27)
Glucose: 84 mg/dL (ref 70–99)
Potassium: 5.1 mmol/L (ref 3.5–5.2)
Sodium: 141 mmol/L (ref 134–144)
eGFR: 71 mL/min/{1.73_m2} (ref >59)

## 2021-08-29 LAB — CBC
Hematocrit: 51.4 % — ABNORMAL HIGH (ref 37.5–51.0)
Hemoglobin: 17 g/dL (ref 13.0–17.7)
MCH: 28.3 pg (ref 26.6–33.0)
MCHC: 33.1 g/dL (ref 31.5–35.7)
MCV: 86 fL (ref 79–97)
Platelets: 190 10*3/uL (ref 150–450)
RBC: 6.01 x10E6/uL — ABNORMAL HIGH (ref 4.14–5.80)
RDW: 14.2 % (ref 11.6–15.4)
WBC: 7.1 10*3/uL (ref 3.4–10.8)

## 2021-08-29 LAB — MAGNESIUM: Magnesium: 2.2 mg/dL (ref 1.6–2.3)

## 2021-08-29 LAB — TSH: TSH: 1.01 u[IU]/mL (ref 0.450–4.500)

## 2021-09-03 DIAGNOSIS — G4733 Obstructive sleep apnea (adult) (pediatric): Secondary | ICD-10-CM | POA: Diagnosis not present

## 2021-09-03 DIAGNOSIS — Z9989 Dependence on other enabling machines and devices: Secondary | ICD-10-CM | POA: Diagnosis not present

## 2021-09-04 ENCOUNTER — Telehealth: Payer: Self-pay | Admitting: Cardiovascular Disease

## 2021-09-04 NOTE — Telephone Encounter (Signed)
?*  STAT* If patient is at the pharmacy, call can be transferred to refill team. ? ? ?1. Which medications need to be refilled? (please list name of each medication and dose if known) MULTAQ 400 MG tablet ?rosuvastatin (CRESTOR) 20 MG tablet ? ?2. Which pharmacy/location (including street and city if local pharmacy) is medication to be sent to? ?[id number] [address] Phone:  [phone]  ?Fax:  [phone] ?  ? ? ?3. Do they need a 30 day or 90 day supply? 90ds ? ?

## 2021-09-05 MED ORDER — MULTAQ 400 MG PO TABS: 400.0000 mg | ORAL_TABLET | Freq: Two times a day (BID) | ORAL | 2 refills | Status: DC

## 2021-09-05 MED ORDER — ROSUVASTATIN CALCIUM 20 MG PO TABS: 20.0000 mg | ORAL_TABLET | Freq: Every day | ORAL | 2 refills | Status: DC

## 2021-09-05 NOTE — Telephone Encounter (Signed)
Refills has been sent to the pharmacy. 

## 2021-09-06 DIAGNOSIS — E291 Testicular hypofunction: Secondary | ICD-10-CM | POA: Diagnosis not present

## 2021-09-14 DIAGNOSIS — E1159 Type 2 diabetes mellitus with other circulatory complications: Secondary | ICD-10-CM | POA: Diagnosis not present

## 2021-09-14 DIAGNOSIS — E291 Testicular hypofunction: Secondary | ICD-10-CM | POA: Diagnosis not present

## 2021-09-14 DIAGNOSIS — E782 Mixed hyperlipidemia: Secondary | ICD-10-CM | POA: Diagnosis not present

## 2021-09-15 DIAGNOSIS — E1159 Type 2 diabetes mellitus with other circulatory complications: Secondary | ICD-10-CM | POA: Diagnosis not present

## 2021-09-15 DIAGNOSIS — E782 Mixed hyperlipidemia: Secondary | ICD-10-CM | POA: Diagnosis not present

## 2021-09-16 NOTE — Progress Notes (Signed)
?Cardiology Office Note:   ?Date:  09/18/2021  ?NAME:  Peter Buck    ?MRN: [id number] ?DOB:  1955/11/06  ? ?PCP:  Peter Shivers, MD  ?Cardiologist:  Evalina Field, MD  ?Electrophysiologist:  None  ? ?Referring MD: Peter Shivers, MD  ? ?Chief Complaint  ?Patient presents with  ? Follow-up  ?   ?  ? ?History of Present Illness:   ?Peter Buck is a 66 y.o. male with a hx of persistent Afib s/p DCCV, obesity, HTN, DM, HLD who presents for follow-up. Found to be back in Afib 08/2021.  He reports he is doing well.  No chest pain or trouble breathing.  He apparently was missing his medications quite frequently and this coincided with return of atrial fibrillation.  He was maintaining sinus rhythm on dronedarone.  No heart failure symptoms.  EKG confirms still in A-fib today.  He is on Ozempic.  Try to lose weight.  He reports knee pain and this limits him from exercising.  He has sleep apnea and he is using his machine.  We discussed repeat attempt at cardioversion.  Given missed doses of Eliquis he will need TEE.  I think this is reasonable.  He did well for over 1 year maintaining sinus rhythm.  If his A-fib comes back we may end up just pursuing rate control strategy.  His weight will likely preclude him from ablation.  We will get an idea of LV function at the time of transesophageal echocardiogram. ? ?Problem List ?1. Persistent Atrial fibrillation ?-CHADSVASC=5 (age, DM, HTN, TIA) ?-failed DCCV 10/2019 ?-MPI normal 10/2019 (breast attenuation) ?-DCCV 07/05/2020 ?-on dronedarone  ?2. Morbid obesity ?-BMI 40 ?3. OSA ?4. HTN ?5. DM ?-A1c 6.2 ?6. HLD ?-T chol 136, HDL 45, LDL 63, TG 169 ? ?Past Medical History: ?Past Medical History:  ?Diagnosis Date  ? Aftercare following surgery 04/23/2018  ? Anemia   ? Arthritis   ? Arthritis, midfoot 12/05/2016  ? Asthma   ? Bipolar affective disorder, depressed ([place]) 02/13/2016  ? Bipolar disorder ([place])   ? Body mass index (bmi) 38.0-38.9, adult 09/16/2017  ? Body mass index  (BMI) 38.0-38.9, adult 09/16/2017  ? Formatting of this note might be different from the original. IMO 10/01 Updates  ? Borderline personality disorder ([place]) 04/29/2016  ? Cervical radiculopathy 02/02/2019  ? Last Assessment & Plan:  Formatting of this note might be different from the original. MRI of the cervical spine shows cervical spondylosis with facet arthrosis most notable for mild to moderate left neural foraminal stenosis at C3-4.  Per patient, nerve conduction studies showed right C7-8 radiculopathy.    Continue with current regimen as outlined in lumbar degenerative disc plan.  ? Chronic pain of left knee 10/19/2019  ? Chronic pain syndrome 02/02/2019  ? Comprehensive diabetic foot examination, type 2 DM, encounter for ([place]) 12/25/2015  ? DDD (degenerative disc disease), lumbar 11/25/2017  ? Last Assessment & Plan:  Formatting of this note might be different from the original. 66 year old male with chronic, predominantly left-sided low back pain with radiation into the left hip and groin.  Today we reviewed his recently updated lumbar MRI which shows impingement of the left L2 nerve root due to broad-based disc bulging lateralizing to the left and projecting into the far left lateral   ? Diabetes mellitus due to underlying condition with unspecified complications ([place]) 7/89/3810  ? Diabetes mellitus without complication ([place])   ? Essential hypertension 11/02/2019  ? Facet arthropathy, cervical 02/02/2019  ?  Foraminal stenosis of cervical region 02/02/2019  ? GERD (gastroesophageal reflux disease)   ? Hammertoe 04/26/2014  ? Hypertension   ? 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 Low back pain 11/25/2017  ? Last Assessment & Plan:  Olivier Frayre is a 66 y.o. male with Low back pain, and acute on chronic low back pain with left hip, knee, and foot pain.  Today I will treat with the patient's current medication of tramadol and diclofenac and add relafen (temporarily d/c diclofenac) for 10 days and increase lyrica. He will  continue with his current appt with his neurosurgery.  Mr. Corbello will return to   ? Muscle spasm of back 02/17/2018  ? Last Assessment & Plan:  Formatting of this note might be different from the original. Continue with tizanidine 4 mg as needed for muscle spasming up to 3 times daily.  ? Neuroforaminal stenosis of lumbar spine 12/17/2018  ? Onychomycosis due to dermatophyte 12/25/2015  ? Pain associated with accessory navicular bone of foot, left 05/25/2018  ? Pain medication agreement 10/16/2018  ? Last Assessment & Plan:  Formatting of this note might be different from the original. Review of prior UDS results are within normal limits.  [place] controlled substance registry reviewed and there were no inconsistencies noted.  ? Paroxysmal atrial fibrillation ([place]) 12/23/2019  ? Plantar fasciitis 01/02/2017  ? Primary osteoarthritis of both first carpometacarpal joints 08/25/2019  ? Second degree burn of finger of right hand 08/25/2019  ? Sleep apnea   ? Therapeutic opioid induced constipation 06/22/2019  ? Last Assessment & Plan:  Formatting of this note might be different from the original. Continue amitiza 35mg b.i.d.  ? Verruca 08/25/2019  ? ? ?Past Surgical History: ?Past Surgical History:  ?Procedure Laterality Date  ? APPENDECTOMY    ? CARDIOVERSION N/A 07/05/2020  ? Procedure: CARDIOVERSION;  Surgeon: OGeralynn Rile MD;  Location: [facility]  Service: Cardiovascular;  Laterality: N/A;  ? CHOLECYSTECTOMY    ? INNER EAR SURGERY    ? KNEE ARTHROSCOPY    ? ? ?Current Medications: ?Current Meds  ?Medication Sig  ? acetaminophen (TYLENOL) 650 MG CR tablet Take 650 mg by mouth every 8 (eight) hours as needed for pain.  ? Calcium Citrate-Vitamin D (CALCIUM CITRATE + D3 PO) Take 1 tablet by mouth at bedtime.  ? celecoxib (CELEBREX) 200 MG capsule Take 200 mg by mouth daily.  ? cetirizine (ZYRTEC) 10 MG tablet   ? diclofenac Sodium (VOLTAREN) 1 % GEL Apply topically.  ? dimenhyDRINATE (DRAMAMINE) 50 MG tablet   ?  dronedarone (MULTAQ) 400 MG tablet Take 1 tablet (400 mg total) by mouth 2 (two) times daily with a meal.  ? ELIQUIS 5 MG TABS tablet TAKE 1 TABLET BY MOUTH TWICE A DAY  ? fexofenadine (ALLEGRA) 180 MG tablet Take by mouth.  ? fluticasone (FLONASE) 50 MCG/ACT nasal spray   ? Horse Chestnut 300 MG CAPS Take 300 mg by mouth in the morning and at bedtime.  ? HYDROcodone-acetaminophen (NORCO/VICODIN) 5-325 MG tablet Take 1 tablet by mouth every 8 (eight) hours as needed for moderate pain.  ? ketotifen (ZADITOR) 0.025 % ophthalmic solution Apply to eye.  ? lovastatin (MEVACOR) 10 MG tablet Take by mouth.  ? lubiprostone (AMITIZA) 24 MCG capsule Take 24 mcg by mouth 2 (two) times daily with a meal.  ? meclizine (ANTIVERT) 25 MG tablet take 1 tablet qd prn dizziness  ? mometasone (NASONEX) 50 MCG/ACT nasal spray daily (at the same time every  day).  ? montelukast (SINGULAIR) 10 MG tablet Take 10 mg by mouth daily.  ? naloxegol oxalate (MOVANTIK) 25 MG TABS tablet Take by mouth.  ? nebivolol (BYSTOLIC) 10 MG tablet Take 1 tablet (10 mg total) by mouth 2 (two) times daily.  ? Oxcarbazepine (TRILEPTAL) 300 MG tablet Take 300 mg by mouth 2 (two) times daily.  ? OZEMPIC, 1 MG/DOSE, 4 MG/3ML SOPN Inject 1 mg into the muscle every Saturday. INJECT THE CONTENTS OF 1 PEN INJECTOR SUBQ ONCE WEEKLY  ? pramoxine (PROCTOFOAM) 1 % foam See admin instructions.  ? RELISTOR 150 MG TABS Take 3 tablets by mouth daily.  ? rosuvastatin (CRESTOR) 20 MG tablet Take 1 tablet (20 mg total) by mouth daily.  ? sildenafil (VIAGRA) 100 MG tablet Take by mouth.  ? testosterone cypionate (DEPOTESTOSTERONE CYPIONATE) 200 MG/ML injection Inject 200 mg into the muscle every 21 ( twenty-one) days.  ? valsartan (DIOVAN) 160 MG tablet Take 160 mg by mouth at bedtime.  ?  ? ?Allergies:    ?Augmentin [amoxicillin-pot clavulanate], Fish allergy, Iodinated contrast media, Ketorolac, Shellfish allergy, Toradol [ketorolac tromethamine], Atorvastatin, Guaifenesin,  Penicillins, Statins, Alendronate sodium, Dextromethorphan, Pine tar, and Pseudoephedrine  ? ?Social History: ?Social History  ? ?Socioeconomic History  ? Marital status: Divorced  ?  Spouse name: Not on file

## 2021-09-17 ENCOUNTER — Encounter (HOSPITAL_COMMUNITY): Payer: Self-pay | Admitting: Cardiovascular Disease

## 2021-09-17 NOTE — Progress Notes (Signed)
Attempted to obtain medical history via telephone, unable to reach at this time. I left a voicemail to return pre surgical testing department's phone call.  

## 2021-09-18 ENCOUNTER — Encounter: Payer: Self-pay | Admitting: Cardiovascular Disease

## 2021-09-18 ENCOUNTER — Ambulatory Visit (INDEPENDENT_AMBULATORY_CARE_PROVIDER_SITE_OTHER): Payer: Medicare HMO | Admitting: Cardiovascular Disease

## 2021-09-18 VITALS — BP 130/70 | HR 81 | Ht 76.0 in | Wt 334.8 lb

## 2021-09-18 DIAGNOSIS — E782 Mixed hyperlipidemia: Secondary | ICD-10-CM | POA: Diagnosis not present

## 2021-09-18 DIAGNOSIS — D6869 Other thrombophilia: Secondary | ICD-10-CM | POA: Diagnosis not present

## 2021-09-18 DIAGNOSIS — I480 Paroxysmal atrial fibrillation: Secondary | ICD-10-CM

## 2021-09-18 NOTE — Patient Instructions (Signed)
Medication Instructions:  ?The current medical regimen is effective;  continue present plan and medications. ? ?*If you need a refill on your cardiac medications before your next appointment, please call your pharmacy* ? ?Follow-Up: ?At [facility], you and your health needs are our priority.  As part of our continuing mission to provide you with exceptional heart care, we have created designated Provider Care Teams.  These Care Teams include your primary Cardiologist (physician) and Advanced Practice Providers (APPs -  Physician Assistants and Nurse Practitioners) who all work together to provide you with the care you need, when you need it. ? ?We recommend signing up for the patient portal called "MyChart".  Sign up information is provided on this After Visit Summary.  MyChart is used to connect with patients for Virtual Visits (Telemedicine).  Patients are able to view lab/test results, encounter notes, upcoming appointments, etc.  Non-urgent messages can be sent to your provider as well.   ?To learn more about what you can do with MyChart, go to [email or web address].   ? ?Your next appointment:   ?May 12th at 11:40 AM ? ?The format for your next appointment:   ?In Person ? ?Provider:   ?Evalina Field, MD   ? ? ?

## 2021-09-20 DIAGNOSIS — M25561 Pain in right knee: Secondary | ICD-10-CM | POA: Diagnosis not present

## 2021-09-20 DIAGNOSIS — M47816 Spondylosis without myelopathy or radiculopathy, lumbar region: Secondary | ICD-10-CM | POA: Diagnosis not present

## 2021-09-21 ENCOUNTER — Encounter: Payer: Self-pay | Admitting: Cardiovascular Disease

## 2021-09-21 DIAGNOSIS — I152 Hypertension secondary to endocrine disorders: Secondary | ICD-10-CM | POA: Diagnosis not present

## 2021-09-21 DIAGNOSIS — E782 Mixed hyperlipidemia: Secondary | ICD-10-CM | POA: Diagnosis not present

## 2021-09-21 DIAGNOSIS — E1159 Type 2 diabetes mellitus with other circulatory complications: Secondary | ICD-10-CM | POA: Diagnosis not present

## 2021-09-21 DIAGNOSIS — E1140 Type 2 diabetes mellitus with diabetic neuropathy, unspecified: Secondary | ICD-10-CM | POA: Diagnosis not present

## 2021-09-23 ENCOUNTER — Encounter: Payer: Self-pay | Admitting: Cardiovascular Disease

## 2021-09-23 DIAGNOSIS — I480 Paroxysmal atrial fibrillation: Secondary | ICD-10-CM

## 2021-09-24 NOTE — Telephone Encounter (Signed)
Called Endo-cardioversion cancelled ?

## 2021-09-25 ENCOUNTER — Ambulatory Visit (HOSPITAL_COMMUNITY): Admission: RE | Admit: 2021-09-25 | Payer: Medicare HMO | Source: Home / Self Care | Admitting: Cardiovascular Disease

## 2021-09-25 ENCOUNTER — Encounter (HOSPITAL_COMMUNITY): Admission: RE | Payer: Self-pay | Source: Home / Self Care

## 2021-09-25 SURGERY — ECHOCARDIOGRAM, TRANSESOPHAGEAL
Anesthesia: General

## 2021-09-26 ENCOUNTER — Encounter: Payer: Self-pay | Admitting: Cardiovascular Disease

## 2021-10-02 DIAGNOSIS — M25561 Pain in right knee: Secondary | ICD-10-CM | POA: Diagnosis not present

## 2021-10-02 DIAGNOSIS — K5903 Drug induced constipation: Secondary | ICD-10-CM | POA: Diagnosis not present

## 2021-10-02 DIAGNOSIS — G8929 Other chronic pain: Secondary | ICD-10-CM | POA: Diagnosis not present

## 2021-10-02 DIAGNOSIS — E1142 Type 2 diabetes mellitus with diabetic polyneuropathy: Secondary | ICD-10-CM | POA: Diagnosis not present

## 2021-10-02 DIAGNOSIS — Z5181 Encounter for therapeutic drug level monitoring: Secondary | ICD-10-CM | POA: Diagnosis not present

## 2021-10-02 DIAGNOSIS — R030 Elevated blood-pressure reading, without diagnosis of hypertension: Secondary | ICD-10-CM | POA: Insufficient documentation

## 2021-10-02 DIAGNOSIS — Z79899 Other long term (current) drug therapy: Secondary | ICD-10-CM | POA: Diagnosis not present

## 2021-10-02 DIAGNOSIS — Z79891 Long term (current) use of opiate analgesic: Secondary | ICD-10-CM | POA: Diagnosis not present

## 2021-10-02 DIAGNOSIS — M25572 Pain in left ankle and joints of left foot: Secondary | ICD-10-CM | POA: Diagnosis not present

## 2021-10-02 DIAGNOSIS — M5136 Other intervertebral disc degeneration, lumbar region: Secondary | ICD-10-CM | POA: Diagnosis not present

## 2021-10-02 DIAGNOSIS — T402X5A Adverse effect of other opioids, initial encounter: Secondary | ICD-10-CM | POA: Diagnosis not present

## 2021-10-03 ENCOUNTER — Encounter (HOSPITAL_COMMUNITY): Payer: Self-pay | Admitting: Cardiology

## 2021-10-04 ENCOUNTER — Ambulatory Visit: Payer: Medicare HMO | Admitting: General Practice

## 2021-10-05 DIAGNOSIS — G4733 Obstructive sleep apnea (adult) (pediatric): Secondary | ICD-10-CM | POA: Diagnosis not present

## 2021-10-05 DIAGNOSIS — M25551 Pain in right hip: Secondary | ICD-10-CM | POA: Diagnosis not present

## 2021-10-08 DIAGNOSIS — E291 Testicular hypofunction: Secondary | ICD-10-CM | POA: Diagnosis not present

## 2021-10-09 NOTE — Anesthesia Preprocedure Evaluation (Addendum)
Anesthesia Evaluation  ?Patient identified by MRN, date of birth, ID band ?Patient awake ? ? ? ?Reviewed: ?Allergy & Precautions, H&P , NPO status , Patient's Chart, lab work & pertinent test results, reviewed documented beta blocker date and time  ? ?Airway ?Mallampati: II ? ?TM Distance: >3 FB ?Neck ROM: Full ? ? ? Dental ?no notable dental hx. ?(+) Teeth Intact, Dental Advisory Given ?  ?Pulmonary ?asthma , sleep apnea ,  ?  ?Pulmonary exam normal ?breath sounds clear to auscultation ? ? ? ? ? ? Cardiovascular ?Exercise Tolerance: Good ?hypertension, Pt. on medications and Pt. on home beta blockers ? ?Rhythm:Irregular Rate:Normal ? ? ?  ?Neuro/Psych ?Depression Bipolar Disorder negative neurological ROS ?   ? GI/Hepatic ?Neg liver ROS, GERD  ,  ?Endo/Other  ?diabetesMorbid obesity ? Renal/GU ?negative Renal ROS  ?negative genitourinary ?  ?Musculoskeletal ? ?(+) Arthritis , Osteoarthritis,   ? Abdominal ?  ?Peds ? Hematology ? ?(+) Blood dyscrasia, anemia ,   ?Anesthesia Other Findings ? ? Reproductive/Obstetrics ?negative OB ROS ? ?  ? ? ? ? ? ? ? ? ? ? ? ? ? ?  ?  ? ? ? ? ? ? ? ?Anesthesia Physical ?Anesthesia Plan ? ?ASA: 3 ? ?Anesthesia Plan: General  ? ?Post-op Pain Management: Minimal or no pain anticipated  ? ?Induction: Intravenous ? ?PONV Risk Score and Plan: 2 and Propofol infusion and Treatment may vary due to age or medical condition ? ?Airway Management Planned: Natural Airway and Nasal Cannula ? ?Additional Equipment:  ? ?Intra-op Plan:  ? ?Post-operative Plan:  ? ?Informed Consent: I have reviewed the patients History and Physical, chart, labs and discussed the procedure including the risks, benefits and alternatives for the proposed anesthesia with the patient or authorized representative who has indicated his/her understanding and acceptance.  ? ? ? ?Dental advisory given ? ?Plan Discussed with: CRNA ? ?Anesthesia Plan Comments:   ? ? ? ? ? ?Anesthesia Quick  Evaluation ? ?

## 2021-10-10 ENCOUNTER — Ambulatory Visit (HOSPITAL_BASED_OUTPATIENT_CLINIC_OR_DEPARTMENT_OTHER): Payer: Medicare HMO | Admitting: Anesthesiology

## 2021-10-10 ENCOUNTER — Encounter (HOSPITAL_COMMUNITY)
Admission: RE | Disposition: A | Discharge status: Home / Self Care | Payer: Self-pay | Source: Home / Self Care | Attending: Cardiology

## 2021-10-10 ENCOUNTER — Ambulatory Visit (HOSPITAL_COMMUNITY): Payer: Medicare HMO | Admitting: Anesthesiology

## 2021-10-10 ENCOUNTER — Ambulatory Visit (HOSPITAL_BASED_OUTPATIENT_CLINIC_OR_DEPARTMENT_OTHER)
Admission: RE | Admit: 2021-10-10 | Discharge: 2021-10-10 | Disposition: A | Discharge status: Home / Self Care | Payer: Medicare HMO | Source: Home / Self Care | Attending: Cardiology | Admitting: Cardiology

## 2021-10-10 ENCOUNTER — Other Ambulatory Visit: Payer: Self-pay

## 2021-10-10 ENCOUNTER — Encounter (HOSPITAL_COMMUNITY): Payer: Self-pay | Admitting: Cardiology

## 2021-10-10 ENCOUNTER — Ambulatory Visit (HOSPITAL_COMMUNITY)
Admission: RE | Admit: 2021-10-10 | Discharge: 2021-10-10 | Disposition: A | Discharge status: Home / Self Care | Payer: Medicare HMO | Attending: Cardiology | Admitting: Cardiology

## 2021-10-10 DIAGNOSIS — Z7901 Long term (current) use of anticoagulants: Secondary | ICD-10-CM | POA: Diagnosis not present

## 2021-10-10 DIAGNOSIS — D6859 Other primary thrombophilia: Secondary | ICD-10-CM | POA: Diagnosis not present

## 2021-10-10 DIAGNOSIS — I4819 Other persistent atrial fibrillation: Secondary | ICD-10-CM

## 2021-10-10 DIAGNOSIS — Z79899 Other long term (current) drug therapy: Secondary | ICD-10-CM | POA: Insufficient documentation

## 2021-10-10 DIAGNOSIS — I10 Essential (primary) hypertension: Secondary | ICD-10-CM | POA: Insufficient documentation

## 2021-10-10 DIAGNOSIS — E119 Type 2 diabetes mellitus without complications: Secondary | ICD-10-CM | POA: Insufficient documentation

## 2021-10-10 DIAGNOSIS — G4733 Obstructive sleep apnea (adult) (pediatric): Secondary | ICD-10-CM | POA: Diagnosis not present

## 2021-10-10 DIAGNOSIS — R69 Illness, unspecified: Secondary | ICD-10-CM | POA: Diagnosis not present

## 2021-10-10 DIAGNOSIS — I4891 Unspecified atrial fibrillation: Secondary | ICD-10-CM | POA: Diagnosis not present

## 2021-10-10 DIAGNOSIS — F319 Bipolar disorder, unspecified: Secondary | ICD-10-CM | POA: Diagnosis not present

## 2021-10-10 DIAGNOSIS — E782 Mixed hyperlipidemia: Secondary | ICD-10-CM | POA: Diagnosis not present

## 2021-10-10 DIAGNOSIS — Z6841 Body Mass Index (BMI) 40.0 and over, adult: Secondary | ICD-10-CM | POA: Insufficient documentation

## 2021-10-10 DIAGNOSIS — D649 Anemia, unspecified: Secondary | ICD-10-CM | POA: Diagnosis not present

## 2021-10-10 HISTORY — PX: TEE WITHOUT CARDIOVERSION: SHX5443

## 2021-10-10 HISTORY — PX: CARDIOVERSION: SHX1299

## 2021-10-10 LAB — GLUCOSE, CAPILLARY: Glucose-Capillary: 110 mg/dL — ABNORMAL HIGH (ref 70–99)

## 2021-10-10 SURGERY — ECHOCARDIOGRAM, TRANSESOPHAGEAL
Anesthesia: General

## 2021-10-10 MED ORDER — SODIUM CHLORIDE 0.9 % IV SOLN
INTRAVENOUS | Status: AC | PRN
  Administered 2021-10-10: 500 mL via INTRAVENOUS

## 2021-10-10 MED ORDER — PROPOFOL 10 MG/ML IV BOLUS
INTRAVENOUS | Status: DC | PRN
Start: 2021-10-10 — End: 2021-10-10
  Administered 2021-10-10: 20 mg via INTRAVENOUS

## 2021-10-10 MED ORDER — PROPOFOL 500 MG/50ML IV EMUL
INTRAVENOUS | Status: DC | PRN
  Administered 2021-10-10: 150 ug/kg/min via INTRAVENOUS

## 2021-10-10 MED ORDER — PHENYLEPHRINE 80 MCG/ML (10ML) SYRINGE FOR IV PUSH (FOR BLOOD PRESSURE SUPPORT)
PREFILLED_SYRINGE | INTRAVENOUS | Status: DC | PRN
  Administered 2021-10-10: 160 ug via INTRAVENOUS

## 2021-10-10 NOTE — Progress Notes (Signed)
? ? ?  Transesophageal Echocardiogram Note ? ?Peter Buck ?[id number] ?May 03, 1956 ? ?Procedure: Transesophageal Echocardiogram ?Indications: Atrial fibrillation ? ?Procedure Details ?Consent: Obtained ?Time Out: Verified patient identification, verified procedure, site/side was marked, verified correct patient position, special equipment/implants available, Radiology Safety Procedures followed,  medications/allergies/relevent history reviewed, required imaging and test results available.  Performed ? ?Medications: ? ?Pt sedated by anesthesia with diprovan 231 mg IV total. ? ?Normal LV function; no LAA thrombus. ?Pt subsequently underwent synchronized DCCV with 200J to sinus rhythm; no immediate complications. Continue apixaban. ? ? ?Complications: No apparent complications ?Patient did tolerate procedure well. ? ?Kirk Ruths, MD  ? ?

## 2021-10-10 NOTE — Transfer of Care (Signed)
Immediate Anesthesia Transfer of Care Note ? ?Patient: Peter Buck ? ?Procedure(s) Performed: TRANSESOPHAGEAL ECHOCARDIOGRAM (TEE) ?CARDIOVERSION ? ?Patient Location: Endoscopy Unit ? ?Anesthesia Type:General ? ?Level of Consciousness: awake and alert  ? ?Airway & Oxygen Therapy: Patient Spontanous Breathing and Patient connected to face mask oxygen ? ?Post-op Assessment: Report given to RN and Post -op Vital signs reviewed and stable ? ?Post vital signs: Reviewed and stable ? ?Last Vitals:  ?Vitals Value Taken Time  ?BP 111/60 10/10/21 0826  ?Temp    ?Pulse 71 10/10/21 0826  ?Resp 18 10/10/21 0826  ?SpO2 93 % 10/10/21 0826  ?Vitals shown include unvalidated device data. ? ?Last Pain:  ?Vitals:  ? 10/10/21 0722  ?TempSrc: Temporal  ?PainSc: 5   ?   ? ?  ? ?Complications: No notable events documented. ?

## 2021-10-10 NOTE — Anesthesia Postprocedure Evaluation (Signed)
Anesthesia Post Note ? ?Patient: Peter Buck ? ?Procedure(s) Performed: TRANSESOPHAGEAL ECHOCARDIOGRAM (TEE) ?CARDIOVERSION ? ?  ? ?Patient location during evaluation: PACU ?Anesthesia Type: General ?Level of consciousness: awake and alert ?Pain management: pain level controlled ?Vital Signs Assessment: post-procedure vital signs reviewed and stable ?Respiratory status: spontaneous breathing, nonlabored ventilation and respiratory function stable ?Cardiovascular status: blood pressure returned to baseline and stable ?Postop Assessment: no apparent nausea or vomiting ?Anesthetic complications: no ? ? ?No notable events documented. ? ?Last Vitals:  ?Vitals:  ? 10/10/21 0835 10/10/21 0845  ?BP: (!) 93/42 (!) 96/43  ?Pulse: 74 72  ?Resp: 19 19  ?Temp:    ?SpO2: 96% 94%  ?  ?Last Pain:  ?Vitals:  ? 10/10/21 0845  ?TempSrc:   ?PainSc: 5   ? ? ?  ?  ?  ?  ?  ?  ? ?Juhi Lagrange,W. [place]MOND ? ? ? ? ?

## 2021-10-10 NOTE — Progress Notes (Signed)
?  Echocardiogram ?Echocardiogram Transesophageal has been performed. ? [name] ?10/10/2021, 8:30 AM ?

## 2021-10-10 NOTE — H&P (Signed)
Office Visit ?09/18/2021 ?[facility] ?[name], MD ?Cardiology Paroxysmal atrial fibrillation [facility]) +3 more ?Dx Follow-up ; Referred by [name], MD ?Reason for Visit  ? ?Additional Documentation ? ?Vitals:  BP 130/70 ?Pulse 81 ?Ht '6\' 4"'$  (1.93 m) ?Wt 151.9 kg Important   ?SpO2 93% ?BMI 40.75 kg/m? ?BSA 2.85 m? ? ?More Vitals  ?Flowsheets:  Anthropometrics, ?NEWS, ?MEWS Score ?  ?Encounter Info:  Billing Info, ?History, ?Allergies, ?Detailed Report ?  ? ?All Notes ? ? ? Progress Notes by [name], MD at 09/18/2021 9:30 AM ? ?Author: [name], MD Author Type: Physician Filed: 09/18/2021 10:06 AM  ?Note Status: Signed Cosign: Cosign Not Required Encounter Date: 09/18/2021  ?Editor: [name], MD (Physician)      ?       ?  ? ?Cardiology Office Note:   ?Date:  09/18/2021  ?NAME:  Peter Buck                                    ?MRN:   [id number] ?DOB:  May 16, 1956        ?  ?PCP:  [name], MD            ?Cardiologist:  Evalina Field, MD  ?Electrophysiologist:  None  ?  ?Referring MD: [name], MD  ?  ?    ?Chief Complaint  ?Patient presents with  ? Follow-up  ?      ?   ?  ?History of Present Illness:   ?Peter Buck is a 66 y.o. male with a hx of persistent Afib s/p DCCV, obesity, HTN, DM, HLD who presents for follow-up. Found to be back in Afib 08/2021.  He reports he is doing well.  No chest pain or trouble breathing.  He apparently was missing his medications quite frequently and this coincided with return of atrial fibrillation.  He was maintaining sinus rhythm on dronedarone.  No heart failure symptoms.  EKG confirms still in A-fib today.  He is on Ozempic.  Try to lose weight.  He reports knee pain and this limits him from exercising.  He has sleep apnea and he is using his machine.  We discussed repeat attempt at cardioversion.  Given missed doses of Eliquis he will need TEE.  I think this is reasonable.  He did well for over 1  year maintaining sinus rhythm.  If his A-fib comes back we may end up just pursuing rate control strategy.  His weight will likely preclude him from ablation.  We will get an idea of LV function at the time of transesophageal echocardiogram. ?  ?Problem List ?1. Persistent Atrial fibrillation ?-CHADSVASC=5 (age, DM, HTN, TIA) ?-failed DCCV 10/2019 ?-MPI normal 10/2019 (breast attenuation) ?-DCCV 07/05/2020 ?-on dronedarone  ?2. Morbid obesity ?-BMI 40 ?3. OSA ?4. HTN ?5. DM ?-A1c 6.2 ?6. HLD ?-T chol 136, HDL 45, LDL 63, TG 169 ?  ?Past Medical History: ?    ?Past Medical History:  ?Diagnosis Date  ? Aftercare following surgery 04/23/2018  ? Anemia    ? Arthritis    ? Arthritis, midfoot 12/05/2016  ? Asthma    ? Bipolar affective disorder, depressed ([place]) 02/13/2016  ? Bipolar disorder ([place])    ? Body mass index (bmi) 38.0-38.9, adult 09/16/2017  ? Body mass index (BMI) 38.0-38.9, adult 09/16/2017  ?  Formatting of this note might be different from the original.  IMO 10/01 Updates  ? Borderline personality disorder ([place]) 04/29/2016  ? Cervical radiculopathy 02/02/2019  ?  Last Assessment & Plan:  Formatting of this note might be different from the original. MRI of the cervical spine shows cervical spondylosis with facet arthrosis most notable for mild to moderate left neural foraminal stenosis at C3-4.  Per patient, nerve conduction studies showed right C7-8 radiculopathy.    Continue with current regimen as outlined in lumbar degenerative disc plan.  ? Chronic pain of left knee 10/19/2019  ? Chronic pain syndrome 02/02/2019  ? Comprehensive diabetic foot examination, type 2 DM, encounter for ([place]) 12/25/2015  ? DDD (degenerative disc disease), lumbar 11/25/2017  ?  Last Assessment & Plan:  Formatting of this note might be different from the original. 66 year old male with chronic, predominantly left-sided low back pain with radiation into the left hip and groin.  Today we reviewed his recently updated lumbar MRI which shows  impingement of the left L2 nerve root due to broad-based disc bulging lateralizing to the left and projecting into the far left lateral   ? Diabetes mellitus due to underlying condition with unspecified complications ([place]) 01/09/3663  ? Diabetes mellitus without complication ([place])    ? Essential hypertension 11/02/2019  ? Facet arthropathy, cervical 02/02/2019  ? Foraminal stenosis of cervical region 02/02/2019  ? GERD (gastroesophageal reflux disease)    ? Hammertoe 04/26/2014  ? Hypertension    ? 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 Low back pain 11/25/2017  ?  Last Assessment & Plan:  [name] is a 66 y.o. male with Low back pain, and acute on chronic low back pain with left hip, knee, and foot pain.  Today I will treat with the patient's current medication of tramadol and diclofenac and add relafen (temporarily d/c diclofenac) for 10 days and increase lyrica. He will continue with his current appt with his neurosurgery.  Mr. Douse will return to   ? Muscle spasm of back 02/17/2018  ?  Last Assessment & Plan:  Formatting of this note might be different from the original. Continue with tizanidine 4 mg as needed for muscle spasming up to 3 times daily.  ? Neuroforaminal stenosis of lumbar spine 12/17/2018  ? Onychomycosis due to dermatophyte 12/25/2015  ? Pain associated with accessory navicular bone of foot, left 05/25/2018  ? Pain medication agreement 10/16/2018  ?  Last Assessment & Plan:  Formatting of this note might be different from the original. Review of prior UDS results are within normal limits.  [place] controlled substance registry reviewed and there were no inconsistencies noted.  ? Paroxysmal atrial fibrillation ([place]) 12/23/2019  ? Plantar fasciitis 01/02/2017  ? Primary osteoarthritis of both first carpometacarpal joints 08/25/2019  ? Second degree burn of finger of right hand 08/25/2019  ? Sleep apnea    ? Therapeutic opioid induced constipation 06/22/2019  ?  Last Assessment & Plan:  Formatting of this note  might be different from the original. Continue amitiza 60mg b.i.d.  ? Verruca 08/25/2019  ?  ?  ?Past Surgical History: ?     ?Past Surgical History:  ?Procedure Laterality Date  ? APPENDECTOMY      ? CARDIOVERSION N/A 07/05/2020  ?  Procedure: CARDIOVERSION;  Surgeon: OGeralynn Rile MD;  [facility]  Service: Cardiovascular;  Laterality: N/A;  ? CHOLECYSTECTOMY      ? INNER EAR SURGERY      ? KNEE ARTHROSCOPY      ?  ?  ?Current  Medications: ?    ?Current Meds  ?Medication Sig  ? acetaminophen (TYLENOL) 650 MG CR tablet Take 650 mg by mouth every 8 (eight) hours as needed for pain.  ? Calcium Citrate-Vitamin D (CALCIUM CITRATE + D3 PO) Take 1 tablet by mouth at bedtime.  ? celecoxib (CELEBREX) 200 MG capsule Take 200 mg by mouth daily.  ? cetirizine (ZYRTEC) 10 MG tablet    ? diclofenac Sodium (VOLTAREN) 1 % GEL Apply topically.  ? dimenhyDRINATE (DRAMAMINE) 50 MG tablet    ? dronedarone (MULTAQ) 400 MG tablet Take 1 tablet (400 mg total) by mouth 2 (two) times daily with a meal.  ? ELIQUIS 5 MG TABS tablet TAKE 1 TABLET BY MOUTH TWICE A DAY  ? fexofenadine (ALLEGRA) 180 MG tablet Take by mouth.  ? fluticasone (FLONASE) 50 MCG/ACT nasal spray    ? Horse Chestnut 300 MG CAPS Take 300 mg by mouth in the morning and at bedtime.  ? HYDROcodone-acetaminophen (NORCO/VICODIN) 5-325 MG tablet Take 1 tablet by mouth every 8 (eight) hours as needed for moderate pain.  ? ketotifen (ZADITOR) 0.025 % ophthalmic solution Apply to eye.  ? lovastatin (MEVACOR) 10 MG tablet Take by mouth.  ? lubiprostone (AMITIZA) 24 MCG capsule Take 24 mcg by mouth 2 (two) times daily with a meal.  ? meclizine (ANTIVERT) 25 MG tablet take 1 tablet qd prn dizziness  ? mometasone (NASONEX) 50 MCG/ACT nasal spray daily (at the same time every day).  ? montelukast (SINGULAIR) 10 MG tablet Take 10 mg by mouth daily.  ? naloxegol oxalate (MOVANTIK) 25 MG TABS tablet Take by mouth.  ? nebivolol (BYSTOLIC) 10 MG tablet Take 1 tablet (10  mg total) by mouth 2 (two) times daily.  ? Oxcarbazepine (TRILEPTAL) 300 MG tablet Take 300 mg by mouth 2 (two) times daily.  ? OZEMPIC, 1 MG/DOSE, 4 MG/3ML SOPN Inject 1 mg into the muscle every Saturday. IN

## 2021-10-10 NOTE — Discharge Instructions (Signed)

## 2021-10-10 NOTE — Anesthesia Procedure Notes (Signed)
Procedure Name: [place] ?Date/Time: 10/10/2021 8:00 AM ?Performed by: Inda Coke, CRNA ?Pre-anesthesia Checklist: Patient identified, Emergency Drugs available, Suction available, Timeout performed and Patient being monitored ?Patient Re-evaluated:Patient Re-evaluated prior to induction ?Oxygen Delivery Method: Nasal cannula ?Induction Type: IV induction ?Dental Injury: Teeth and Oropharynx as per pre-operative assessment  ? ? ? ? ?

## 2021-10-11 ENCOUNTER — Encounter (HOSPITAL_COMMUNITY): Payer: Self-pay | Admitting: Cardiology

## 2021-10-12 DIAGNOSIS — R262 Difficulty in walking, not elsewhere classified: Secondary | ICD-10-CM | POA: Diagnosis not present

## 2021-10-12 DIAGNOSIS — Z6841 Body Mass Index (BMI) 40.0 and over, adult: Secondary | ICD-10-CM | POA: Diagnosis not present

## 2021-10-12 DIAGNOSIS — L03019 Cellulitis of unspecified finger: Secondary | ICD-10-CM | POA: Diagnosis not present

## 2021-10-12 DIAGNOSIS — M545 Low back pain, unspecified: Secondary | ICD-10-CM | POA: Diagnosis not present

## 2021-10-12 DIAGNOSIS — G8929 Other chronic pain: Secondary | ICD-10-CM | POA: Diagnosis not present

## 2021-10-15 ENCOUNTER — Encounter: Payer: Self-pay | Admitting: Cardiovascular Disease

## 2021-10-15 DIAGNOSIS — E1159 Type 2 diabetes mellitus with other circulatory complications: Secondary | ICD-10-CM | POA: Diagnosis not present

## 2021-10-15 DIAGNOSIS — E782 Mixed hyperlipidemia: Secondary | ICD-10-CM | POA: Diagnosis not present

## 2021-10-15 NOTE — Telephone Encounter (Signed)
Spoke to patient Peter Buck's advice given. ?

## 2021-10-16 DIAGNOSIS — R030 Elevated blood-pressure reading, without diagnosis of hypertension: Secondary | ICD-10-CM | POA: Diagnosis not present

## 2021-10-16 DIAGNOSIS — M5136 Other intervertebral disc degeneration, lumbar region: Secondary | ICD-10-CM | POA: Diagnosis not present

## 2021-10-16 DIAGNOSIS — Z0289 Encounter for other administrative examinations: Secondary | ICD-10-CM | POA: Diagnosis not present

## 2021-10-16 DIAGNOSIS — M5412 Radiculopathy, cervical region: Secondary | ICD-10-CM | POA: Diagnosis not present

## 2021-10-16 DIAGNOSIS — M25572 Pain in left ankle and joints of left foot: Secondary | ICD-10-CM | POA: Diagnosis not present

## 2021-10-16 DIAGNOSIS — M25561 Pain in right knee: Secondary | ICD-10-CM | POA: Diagnosis not present

## 2021-10-16 DIAGNOSIS — M47816 Spondylosis without myelopathy or radiculopathy, lumbar region: Secondary | ICD-10-CM | POA: Diagnosis not present

## 2021-10-16 DIAGNOSIS — G8929 Other chronic pain: Secondary | ICD-10-CM | POA: Diagnosis not present

## 2021-10-16 DIAGNOSIS — R69 Illness, unspecified: Secondary | ICD-10-CM | POA: Diagnosis not present

## 2021-10-16 DIAGNOSIS — Z6841 Body Mass Index (BMI) 40.0 and over, adult: Secondary | ICD-10-CM | POA: Diagnosis not present

## 2021-10-18 DIAGNOSIS — R262 Difficulty in walking, not elsewhere classified: Secondary | ICD-10-CM | POA: Diagnosis not present

## 2021-10-18 DIAGNOSIS — M545 Low back pain, unspecified: Secondary | ICD-10-CM | POA: Diagnosis not present

## 2021-10-22 DIAGNOSIS — M7061 Trochanteric bursitis, right hip: Secondary | ICD-10-CM | POA: Diagnosis not present

## 2021-10-22 DIAGNOSIS — M1711 Unilateral primary osteoarthritis, right knee: Secondary | ICD-10-CM | POA: Diagnosis not present

## 2021-10-23 DIAGNOSIS — R69 Illness, unspecified: Secondary | ICD-10-CM | POA: Diagnosis not present

## 2021-10-23 DIAGNOSIS — F319 Bipolar disorder, unspecified: Secondary | ICD-10-CM | POA: Diagnosis not present

## 2021-10-25 NOTE — Progress Notes (Signed)
?Cardiology Office Note:   ?Date:  10/26/2021  ?NAME:  Peter Buck    ?MRN: [id number] ?DOB:  1955-11-15  ? ?PCP:  [name], MD  ?Cardiologist:  Evalina Field, MD  ?Electrophysiologist:  None  ? ?Referring MD: [name], MD  ? ?Chief Complaint  ?Patient presents with  ? Follow-up  ?   ?  ? ?History of Present Illness:   ?Peter Buck is a 66 y.o. male with a hx of persistent atrial fibrillation, obesity, OSA, diabetes, hypertension, lipidemia presents for follow-up.  Had recurrence of atrial fibrillation.  Sent for TEE/cardioversion.  Had stopped taking his rhythm control medication. Tolerated his procedure well.  EKG shows he is maintaining sinus rhythm.  Back on dronedarone.  On Eliquis with no missed doses.  Overall doing quite well.  Blood pressure well controlled.  He has lost 12 pounds since our last visit.  He is more motivated to lose weight.  He does understand that significant weight loss will help his overall cardiovascular condition and reduce his atrial fibrillation burden. ? ?Problem List ?1. Persistent Atrial fibrillation ?-CHADSVASC=5 (age, DM, HTN, TIA) ?-failed DCCV 10/2019 ?-MPI normal 10/2019 (breast attenuation) ?-DCCV 07/05/2020 ?-on dronedarone  ?-recurrence 09/2021 -> TEE/DCCV 10/10/2021 ?2. Morbid obesity ?-BMI 40 ?3. OSA ?4. HTN ?5. DM ?-A1c 6.2 ?6. HLD ?-T chol 136, HDL 45, LDL 63, TG 169 ? ?Past Medical History: ?Past Medical History:  ?Diagnosis Date  ? Aftercare following surgery 04/23/2018  ? Anemia   ? Arthritis   ? Arthritis, midfoot 12/05/2016  ? Asthma   ? Bipolar affective disorder, depressed ([place]) 02/13/2016  ? Bipolar disorder ([place])   ? Body mass index (bmi) 38.0-38.9, adult 09/16/2017  ? Body mass index (BMI) 38.0-38.9, adult 09/16/2017  ? Formatting of this note might be different from the original. IMO 10/01 Updates  ? Borderline personality disorder ([place]) 04/29/2016  ? Cervical radiculopathy 02/02/2019  ? Last Assessment & Plan:  Formatting of this note might be  different from the original. MRI of the cervical spine shows cervical spondylosis with facet arthrosis most notable for mild to moderate left neural foraminal stenosis at C3-4.  Per patient, nerve conduction studies showed right C7-8 radiculopathy.    Continue with current regimen as outlined in lumbar degenerative disc plan.  ? Chronic pain of left knee 10/19/2019  ? Chronic pain syndrome 02/02/2019  ? Comprehensive diabetic foot examination, type 2 DM, encounter for ([place]) 12/25/2015  ? DDD (degenerative disc disease), lumbar 11/25/2017  ? Last Assessment & Plan:  Formatting of this note might be different from the original. 66 year old male with chronic, predominantly left-sided low back pain with radiation into the left hip and groin.  Today we reviewed his recently updated lumbar MRI which shows impingement of the left L2 nerve root due to broad-based disc bulging lateralizing to the left and projecting into the far left lateral   ? Diabetes mellitus due to underlying condition with unspecified complications ([place]) 9/62/8366  ? Diabetes mellitus without complication ([place])   ? Essential hypertension 11/02/2019  ? Facet arthropathy, cervical 02/02/2019  ? Foraminal stenosis of cervical region 02/02/2019  ? GERD (gastroesophageal reflux disease)   ? Hammertoe 04/26/2014  ? Hypertension   ? 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 Low back pain 11/25/2017  ? Last Assessment & Plan:  Ocie Tino is a 67 y.o. male with Low back pain, and acute on chronic low back pain with left hip, knee, and foot pain.  Today I will treat with  the patient's current medication of tramadol and diclofenac and add relafen (temporarily d/c diclofenac) for 10 days and increase lyrica. He will continue with his current appt with his neurosurgery.  Mr. Coiner will return to   ? Muscle spasm of back 02/17/2018  ? Last Assessment & Plan:  Formatting of this note might be different from the original. Continue with tizanidine 4 mg as needed for muscle spasming up  to 3 times daily.  ? Neuroforaminal stenosis of lumbar spine 12/17/2018  ? Onychomycosis due to dermatophyte 12/25/2015  ? Pain associated with accessory navicular bone of foot, left 05/25/2018  ? Pain medication agreement 10/16/2018  ? Last Assessment & Plan:  Formatting of this note might be different from the original. Review of prior UDS results are within normal limits.  [place] controlled substance registry reviewed and there were no inconsistencies noted.  ? Paroxysmal atrial fibrillation ([place]) 12/23/2019  ? Plantar fasciitis 01/02/2017  ? Primary osteoarthritis of both first carpometacarpal joints 08/25/2019  ? Second degree burn of finger of right hand 08/25/2019  ? Sleep apnea   ? Therapeutic opioid induced constipation 06/22/2019  ? Last Assessment & Plan:  Formatting of this note might be different from the original. Continue amitiza 72mg b.i.d.  ? Verruca 08/25/2019  ? ? ?Past Surgical History: ?Past Surgical History:  ?Procedure Laterality Date  ? APPENDECTOMY    ? CARDIOVERSION N/A 07/05/2020  ? Procedure: CARDIOVERSION;  Surgeon: OGeralynn Rile MD;  [facility]  Service: Cardiovascular;  Laterality: N/A;  ? CARDIOVERSION N/A 10/10/2021  ? Procedure: CARDIOVERSION;  Surgeon: CLelon Perla MD;  [facility];  Service: Cardiovascular;  Laterality: N/A;  ? CHOLECYSTECTOMY    ? INNER EAR SURGERY    ? KNEE ARTHROSCOPY    ? TEE WITHOUT CARDIOVERSION N/A 10/10/2021  ? Procedure: TRANSESOPHAGEAL ECHOCARDIOGRAM (TEE);  Surgeon: CLelon Perla MD;  [facility];  Service: Cardiovascular;  Laterality: N/A;  ? ? ?Current Medications: ?Current Meds  ?Medication Sig  ? acetaminophen (TYLENOL) 500 MG tablet Take 500 mg by mouth every 6 (six) hours as needed for moderate pain or mild pain.  ? betamethasone dipropionate 0.05 % cream Apply 1 application. topically daily as needed (itching).  ? Calcium Citrate-Vitamin D (CALCIUM CITRATE + D3 PO) Take 1 tablet by mouth at bedtime.  630 mg /12.5 mcg  ? celecoxib (CELEBREX) 200 MG capsule Take 200 mg by mouth 2 (two) times daily.  ? diclofenac Sodium (VOLTAREN) 1 % GEL Apply 1 g topically 4 (four) times daily as needed (pain).  ? dronedarone (MULTAQ) 400 MG tablet Take 1 tablet (400 mg total) by mouth 2 (two) times daily with a meal.  ? ELIQUIS 5 MG TABS tablet TAKE 1 TABLET BY MOUTH TWICE A DAY  ? FIBER ADULT GUMMIES PO Take 10 g by mouth daily.  ? Horse Chestnut 300 MG CAPS Take 600 mg by mouth in the morning and at bedtime.  ? HYDROcodone-acetaminophen (NORCO/VICODIN) 5-325 MG tablet Take 1 tablet by mouth every 12 (twelve) hours as needed for moderate pain.  ? Magnesium Citrate 200 MG TABS Take 200 mg by mouth daily. Gummies  ? mometasone (NASONEX) 50 MCG/ACT nasal spray Place 1 spray into the nose daily.  ? nebivolol (BYSTOLIC) 10 MG tablet Take 1 tablet (10 mg total) by mouth 2 (two) times daily.  ? Oxcarbazepine (TRILEPTAL) 300 MG tablet Take 300 mg by mouth 2 (two) times daily.  ? OZEMPIC, 1 MG/DOSE, 4 MG/3ML SOPN  Inject 1 mg into the muscle once a week. INJECT THE CONTENTS OF 1 PEN INJECTOR SUBQ ONCE WEEKLY  ? pregabalin (LYRICA) 150 MG capsule Take 150 mg by mouth 2 (two) times daily.  ? RELISTOR 150 MG TABS Take 3 tablets by mouth daily.  ? rosuvastatin (CRESTOR) 20 MG tablet Take 1 tablet (20 mg total) by mouth daily. (Patient taking differently: Take 20 mg by mouth in the morning.)  ? testosterone cypionate (DEPOTESTOSTERONE CYPIONATE) 200 MG/ML injection Inject 200 mg into the muscle every 21 ( twenty-one) days.  ? valsartan (DIOVAN) 160 MG tablet Take 80-160 mg by mouth See admin instructions. Take 80 mg in the morning and 160 mg at bedtime  ?  ? ?Allergies:    ?Augmentin [amoxicillin-pot clavulanate], Fish allergy, Iodinated contrast media, Ketorolac, Toradol [ketorolac tromethamine], Atorvastatin, Guaifenesin, Penicillins, Statins, Alendronate sodium, Dextromethorphan, Pine tar, and Pseudoephedrine  ? ?Social History: ?Social  History  ? ?Socioeconomic History  ? Marital status: Divorced  ?  Spouse name: Not on file  ? Number of children: Not on file  ? Years of education: Not on file  ? Highest education level: Not on file  ?Occ

## 2021-10-26 ENCOUNTER — Encounter: Payer: Self-pay | Admitting: Cardiovascular Disease

## 2021-10-26 ENCOUNTER — Ambulatory Visit: Payer: Medicare HMO | Admitting: Cardiovascular Disease

## 2021-10-26 VITALS — BP 128/62 | HR 67 | Ht 75.5 in | Wt 322.4 lb

## 2021-10-26 DIAGNOSIS — D6869 Other thrombophilia: Secondary | ICD-10-CM

## 2021-10-26 DIAGNOSIS — Z7901 Long term (current) use of anticoagulants: Secondary | ICD-10-CM

## 2021-10-26 DIAGNOSIS — I480 Paroxysmal atrial fibrillation: Secondary | ICD-10-CM | POA: Diagnosis not present

## 2021-10-26 DIAGNOSIS — E782 Mixed hyperlipidemia: Secondary | ICD-10-CM

## 2021-10-26 NOTE — Patient Instructions (Signed)
Medication Instructions:  ?The current medical regimen is effective;  continue present plan and medications. ? ?*If you need a refill on your cardiac medications before your next appointment, please call your pharmacy* ? ? ?Follow-Up: ?At [facility], you and your health needs are our priority.  As part of our continuing mission to provide you with exceptional heart care, we have created designated Provider Care Teams.  These Care Teams include your primary Cardiologist (physician) and Advanced Practice Providers (APPs -  Physician Assistants and Nurse Practitioners) who all work together to provide you with the care you need, when you need it. ? ?We recommend signing up for the patient portal called "MyChart".  Sign up information is provided on this After Visit Summary.  MyChart is used to connect with patients for Virtual Visits (Telemedicine).  Patients are able to view lab/test results, encounter notes, upcoming appointments, etc.  Non-urgent messages can be sent to your provider as well.   ?To learn more about what you can do with MyChart, go to [email or web address]://[email or web address].com.   ? ?Your next appointment:   ?6 month(s) ? ?The format for your next appointment:   ?In Person ? ?Provider:   ?Penuelas T O'Neal, MD { ? ? ? ? ? ? ? ?

## 2021-11-01 DIAGNOSIS — M25651 Stiffness of right hip, not elsewhere classified: Secondary | ICD-10-CM | POA: Diagnosis not present

## 2021-11-01 DIAGNOSIS — R2689 Other abnormalities of gait and mobility: Secondary | ICD-10-CM | POA: Diagnosis not present

## 2021-11-01 DIAGNOSIS — M25551 Pain in right hip: Secondary | ICD-10-CM | POA: Diagnosis not present

## 2021-11-04 DIAGNOSIS — G4733 Obstructive sleep apnea (adult) (pediatric): Secondary | ICD-10-CM | POA: Diagnosis not present

## 2021-11-07 ENCOUNTER — Encounter: Payer: Self-pay | Admitting: Sports Medicine

## 2021-11-07 ENCOUNTER — Ambulatory Visit: Payer: Medicare HMO | Admitting: Sports Medicine

## 2021-11-07 DIAGNOSIS — M25651 Stiffness of right hip, not elsewhere classified: Secondary | ICD-10-CM | POA: Diagnosis not present

## 2021-11-07 DIAGNOSIS — E291 Testicular hypofunction: Secondary | ICD-10-CM | POA: Diagnosis not present

## 2021-11-07 DIAGNOSIS — M2142 Flat foot [pes planus] (acquired), left foot: Secondary | ICD-10-CM

## 2021-11-07 DIAGNOSIS — R2689 Other abnormalities of gait and mobility: Secondary | ICD-10-CM | POA: Diagnosis not present

## 2021-11-07 DIAGNOSIS — M25551 Pain in right hip: Secondary | ICD-10-CM | POA: Diagnosis not present

## 2021-11-07 DIAGNOSIS — M79674 Pain in right toe(s): Secondary | ICD-10-CM

## 2021-11-07 DIAGNOSIS — M79675 Pain in left toe(s): Secondary | ICD-10-CM

## 2021-11-07 DIAGNOSIS — E088 Diabetes mellitus due to underlying condition with unspecified complications: Secondary | ICD-10-CM | POA: Diagnosis not present

## 2021-11-07 DIAGNOSIS — M2141 Flat foot [pes planus] (acquired), right foot: Secondary | ICD-10-CM

## 2021-11-07 DIAGNOSIS — R269 Unspecified abnormalities of gait and mobility: Secondary | ICD-10-CM

## 2021-11-07 DIAGNOSIS — I739 Peripheral vascular disease, unspecified: Secondary | ICD-10-CM

## 2021-11-07 DIAGNOSIS — B351 Tinea unguium: Secondary | ICD-10-CM | POA: Diagnosis not present

## 2021-11-07 DIAGNOSIS — G629 Polyneuropathy, unspecified: Secondary | ICD-10-CM

## 2021-11-07 NOTE — Addendum Note (Signed)
Addended by: Cranford Mon R on: 11/07/2021 10:38 AM   Modules accepted: Orders

## 2021-11-07 NOTE — Progress Notes (Signed)
Subjective: Peter Buck is a 66 y.o. male patient with history of diabetes who returns to office today complaining of long,mildly painful nails  while ambulating in shoes; unable to trim. Patient reports that he is in PT for his knee, got a steroid shot 3 weeks ago and wants to do therapy.   Fasting blood sugar does not check A1c 6.4 he thinks PCP Dr. Sloan Leiter last month  Patient Active Problem List   Diagnosis Date Noted   Age related osteoporosis 08/14/2021   Chronic, continuous use of opioids 05/01/2021   Chronic venous insufficiency 12/19/2020   Localized, primary osteoarthritis of hand 11/24/2020   Pain in right hand 11/24/2020   Diabetic polyneuropathy associated with type 2 diabetes mellitus ([place]) 10/31/2020   Preoperative evaluation to rule out surgical contraindication 07/25/2020   Morbid obesity ([place]) 04/11/2020   Anemia    Arthritis    Asthma    Bipolar disorder ([place])    Diabetes mellitus without complication ([place])    GERD (gastroesophageal reflux disease)    Hypertension    Sleep apnea    Paroxysmal atrial fibrillation ([place]) 12/23/2019   Essential hypertension 11/02/2019   Diabetes mellitus due to underlying condition with unspecified complications ([place]) 11/94/1740   Chronic pain of left knee 10/19/2019   Primary osteoarthritis of both first carpometacarpal joints 08/25/2019   Second degree burn of finger of right hand 08/25/2019   [place] 08/25/2019   Therapeutic opioid induced constipation 06/22/2019   Cervical radiculopathy 02/02/2019   Chronic pain syndrome 02/02/2019   Facet arthropathy, cervical 02/02/2019   Foraminal stenosis of cervical region 02/02/2019   Neuroforaminal stenosis of lumbar spine 12/17/2018   Pain medication agreement 10/16/2018   Pain associated with accessory navicular bone of foot, left 05/25/2018   Aftercare following surgery 04/23/2018   Muscle spasm of back 02/17/2018   DDD (degenerative disc disease), lumbar 11/25/2017   Low back  pain 11/25/2017   Body mass index (BMI) 38.0-38.9, adult 09/16/2017   Plantar fasciitis 01/02/2017   Arthritis, midfoot 12/05/2016   Borderline personality disorder ([place]) 04/29/2016   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   Bipolar affective disorder, depressed ([place]) 02/13/2016   Comprehensive diabetic foot examination, type 2 DM, encounter for ([place]) 12/25/2015   Onychomycosis due to dermatophyte 12/25/2015   Hammertoe 04/26/2014   Current Outpatient Medications on File Prior to Visit  Medication Sig Dispense Refill   acetaminophen (TYLENOL) 500 MG tablet Take 500 mg by mouth every 6 (six) hours as needed for moderate pain or mild pain.     betamethasone dipropionate 0.05 % cream Apply 1 application. topically daily as needed (itching).     Calcium Citrate-Vitamin D (CALCIUM CITRATE + D3 PO) Take 1 tablet by mouth at bedtime. 630 mg /12.5 mcg     celecoxib (CELEBREX) 200 MG capsule Take 200 mg by mouth 2 (two) times daily.     cetirizine (ZYRTEC) 10 MG tablet Take 10 mg by mouth daily as needed for allergies. (Patient not taking: Reported on 10/26/2021)     chlorpheniramine (CHLOR-TRIMETON) 4 MG tablet Take 4 mg by mouth 3 (three) times daily as needed for rhinitis. (Patient not taking: Reported on 10/26/2021)     diclofenac Sodium (VOLTAREN) 1 % GEL Apply 1 g topically 4 (four) times daily as needed (pain).     dimenhyDRINATE (DRAMAMINE) 50 MG tablet Take 50 mg by mouth daily as needed for nausea. (Patient not taking: Reported on 10/26/2021)     dronedarone (MULTAQ) 400 MG tablet Take 1 tablet (  400 mg total) by mouth 2 (two) times daily with a meal. 180 tablet 2   ELIQUIS 5 MG TABS tablet TAKE 1 TABLET BY MOUTH TWICE A DAY 180 tablet 3   fexofenadine (ALLEGRA) 180 MG tablet Take 180 mg by mouth daily as needed for allergies. (Patient not taking: Reported on 10/26/2021)     FIBER ADULT GUMMIES PO Take 10 g by mouth daily.     [place] 300 MG CAPS Take 600 mg by mouth in the morning and at bedtime.      HYDROcodone-acetaminophen (NORCO/VICODIN) 5-325 MG tablet Take 1 tablet by mouth every 12 (twelve) hours as needed for moderate pain.     HYDROCORTISONE, TOPICAL, (SCALPICIN MAXIMUM STRENGTH) 1 % SOLN Apply 1 application. topically every 30 (thirty) days. (Patient not taking: Reported on 10/26/2021)     ketotifen (ZADITOR) 0.025 % ophthalmic solution Place 1 drop into both eyes daily as needed (itching eyes). (Patient not taking: Reported on 10/26/2021)     Magnesium Citrate 200 MG TABS Take 200 mg by mouth daily. Gummies     meclizine (ANTIVERT) 25 MG tablet Take 25 mg by mouth 2 (two) times daily as needed for dizziness. (Patient not taking: Reported on 10/26/2021)     mometasone (NASONEX) 50 MCG/ACT nasal spray Place 1 spray into the nose daily.     montelukast (SINGULAIR) 10 MG tablet Take 10 mg by mouth daily as needed (allergies). (Patient not taking: Reported on 10/26/2021)     naloxegol oxalate (MOVANTIK) 25 MG TABS tablet Take 25 mg by mouth daily as needed (Constipation). Will take if ran out of relistor (Patient not taking: Reported on 10/26/2021)     nebivolol (BYSTOLIC) 10 MG tablet Take 1 tablet (10 mg total) by mouth 2 (two) times daily. 180 tablet 2   Oxcarbazepine (TRILEPTAL) 300 MG tablet Take 300 mg by mouth 2 (two) times daily.     OZEMPIC, 1 MG/DOSE, 4 MG/3ML SOPN Inject 1 mg into the muscle once a week. INJECT THE CONTENTS OF 1 PEN INJECTOR SUBQ ONCE WEEKLY     pregabalin (LYRICA) 150 MG capsule Take 150 mg by mouth 2 (two) times daily.     RELISTOR 150 MG TABS Take 3 tablets by mouth daily.     rosuvastatin (CRESTOR) 20 MG tablet Take 1 tablet (20 mg total) by mouth daily. (Patient taking differently: Take 20 mg by mouth in the morning.) 90 tablet 2   sildenafil (VIAGRA) 100 MG tablet Take 100 mg by mouth as needed for erectile dysfunction. (Patient not taking: Reported on 10/26/2021)     testosterone cypionate (DEPOTESTOSTERONE CYPIONATE) 200 MG/ML injection Inject 200 mg into  the muscle every 21 ( twenty-one) days.     valsartan (DIOVAN) 160 MG tablet Take 80-160 mg by mouth See admin instructions. Take 80 mg in the morning and 160 mg at bedtime     No current facility-administered medications on file prior to visit.   Allergies  Allergen Reactions   Augmentin [Amoxicillin-Pot Clavulanate] Anaphylaxis   Fish Allergy Anaphylaxis    Specifically [place]   Iodinated Contrast Media Anaphylaxis, Hives, Other (See Comments) and Rash    Severe hypotension Developed reaction a day and a half after a lumbar nerve root block on 09/22/17.  Treated by dermatologist with prednisone taper and antihistamines. Developed reaction a day and a half after a lumbar nerve root block on 09/22/17.  Treated by dermatologist with prednisone taper and antihistamines. Developed reaction a day and a half after  a lumbar nerve root block on 09/22/17.  Treated by dermatologist with prednisone taper and antihistamines.    Ketorolac Anaphylaxis   Toradol [Ketorolac Tromethamine] Anaphylaxis    "It put me in shock.  My blood pressure tanked and I was itchy all over; had to go to the ER."   Atorvastatin Other (See Comments)    Severe back ache   Guaifenesin Swelling and Other (See Comments)    "It sorta made my throat close up."    Penicillins Dermatitis   Statins Other (See Comments)    Severe back ache   Alendronate Sodium Other (See Comments)    Unknown reaction    Dextromethorphan Other (See Comments)    Unknown reaction   Pine Tar Other (See Comments)    Arms get puffy and itching   Pseudoephedrine Other (See Comments)    "It dries me out.  I take it when my other allergy medicine isn't working."    Recent Results (from the past 2160 hour(s))  Basic metabolic panel     Status: None   Collection Time: 08/28/21  2:05 PM  Result Value Ref Range   Glucose 84 70 - 99 mg/dL   BUN 18 8 - 27 mg/dL   Creatinine, Ser 1.13 0.76 - 1.27 mg/dL   eGFR 71 >59 mL/min/1.73   BUN/Creatinine Ratio  16 10 - 24   Sodium 141 134 - 144 mmol/L   Potassium 5.1 3.5 - 5.2 mmol/L   Chloride 101 96 - 106 mmol/L   CO2 26 20 - 29 mmol/L   Calcium 10.0 8.6 - 10.2 mg/dL  CBC     Status: Abnormal   Collection Time: 08/28/21  2:05 PM  Result Value Ref Range   WBC 7.1 3.4 - 10.8 x10E3/uL   RBC 6.01 (H) 4.14 - 5.80 x10E6/uL   Hemoglobin 17.0 13.0 - 17.7 g/dL   Hematocrit 51.4 (H) 37.5 - 51.0 %   MCV 86 79 - 97 fL   MCH 28.3 26.6 - 33.0 pg   MCHC 33.1 31.5 - 35.7 g/dL   RDW 14.2 11.6 - 15.4 %   Platelets 190 150 - 450 x10E3/uL  Magnesium     Status: None   Collection Time: 08/28/21  2:05 PM  Result Value Ref Range   Magnesium 2.2 1.6 - 2.3 mg/dL  TSH     Status: None   Collection Time: 08/28/21  2:05 PM  Result Value Ref Range   TSH 1.010 0.450 - 4.500 uIU/mL  Glucose, capillary     Status: Abnormal   Collection Time: 10/10/21  7:31 AM  Result Value Ref Range   Glucose-Capillary 110 (H) 70 - 99 mg/dL    Comment: Glucose reference range applies only to samples taken after fasting for at least 8 hours.    Objective: General: Patient is awake, alert, and oriented x 3 and in no acute distress.  Integument: Skin is warm, dry and supple bilateral. Nails are tender, long, thickened and dystrophic with subungual debris, consistent with onychomycosis, 1-5 bilateral.  Dry skin bilateral.  No open lesions or preulcerative lesions present bilateral. Remaining integument unremarkable.  Vasculature:  Dorsalis Pedis pulse 1/4 bilateral. Posterior Tibial pulse  0/4 bilateral due to edema. Capillary fill time <5 sec 1-5 bilateral.  Minimal hair growth noted bilateral.  Mild varicosities present bilateral ankles with severe varicosities noted in upper thighs and legs as previously documented.  1+ pitting edema noted bilateral.  Neurology: Protective sensation intact vibratory sensation diminished bilateral.  No Babinski sign present bilateral.   Musculoskeletal: Pronated gait. Asymptomatic pes planus and  hammertoe deformity noted bilateral. There is limited ankle joint range of motion bilateral consistent with equinus deformity.  No tenderness with calf compression bilateral.  Assessment and Plan: Problem List Items Addressed This Visit       Endocrine   Diabetes mellitus due to underlying condition with unspecified complications ([place])   Other Visit Diagnoses     Pain due to onychomycosis of toenails of both feet    -  Primary   Neuropathy       Relevant Orders   Ambulatory referral to Physical Therapy   PVD (peripheral vascular disease) ([place])       Relevant Medications   EPINEPHrine 0.3 mg/0.3 mL IJ SOAJ injection   Pes planus of both feet       Relevant Orders   Ambulatory referral to Physical Therapy   Abnormality of gait       Relevant Orders   Ambulatory referral to Physical Therapy        -Examined patient. -Re-Discussed with patient the importance of daily foot inspection in the setting of diabetes -Mechanically debrided all nails 1-5 bilateral using sterile nail nipper and filed with dremel without incident  -Continue with diabetic shoes that provide support and do not rub toes -Rx PT at [facility] as a add on since he is going for his knee to help with his feet and stability in gait -Return to office in 3 months for at risk foot care or sooner if problems or issues arise.  Landis Martins, DPM

## 2021-11-08 DIAGNOSIS — M47816 Spondylosis without myelopathy or radiculopathy, lumbar region: Secondary | ICD-10-CM | POA: Diagnosis not present

## 2021-11-13 ENCOUNTER — Ambulatory Visit: Payer: Medicare HMO | Admitting: Sports Medicine

## 2021-11-13 DIAGNOSIS — M25551 Pain in right hip: Secondary | ICD-10-CM | POA: Diagnosis not present

## 2021-11-13 DIAGNOSIS — R2689 Other abnormalities of gait and mobility: Secondary | ICD-10-CM | POA: Diagnosis not present

## 2021-11-13 DIAGNOSIS — M25651 Stiffness of right hip, not elsewhere classified: Secondary | ICD-10-CM | POA: Diagnosis not present

## 2021-11-15 DIAGNOSIS — E782 Mixed hyperlipidemia: Secondary | ICD-10-CM | POA: Diagnosis not present

## 2021-11-15 DIAGNOSIS — E1159 Type 2 diabetes mellitus with other circulatory complications: Secondary | ICD-10-CM | POA: Diagnosis not present

## 2021-11-15 DIAGNOSIS — Z23 Encounter for immunization: Secondary | ICD-10-CM | POA: Diagnosis not present

## 2021-11-15 DIAGNOSIS — Z6838 Body mass index (BMI) 38.0-38.9, adult: Secondary | ICD-10-CM | POA: Diagnosis not present

## 2021-11-15 DIAGNOSIS — Z0000 Encounter for general adult medical examination without abnormal findings: Secondary | ICD-10-CM | POA: Diagnosis not present

## 2021-11-18 DIAGNOSIS — M545 Low back pain, unspecified: Secondary | ICD-10-CM | POA: Diagnosis not present

## 2021-11-18 DIAGNOSIS — R262 Difficulty in walking, not elsewhere classified: Secondary | ICD-10-CM | POA: Diagnosis not present

## 2021-11-20 DIAGNOSIS — R2689 Other abnormalities of gait and mobility: Secondary | ICD-10-CM | POA: Diagnosis not present

## 2021-11-20 DIAGNOSIS — M25551 Pain in right hip: Secondary | ICD-10-CM | POA: Diagnosis not present

## 2021-11-20 DIAGNOSIS — M25651 Stiffness of right hip, not elsewhere classified: Secondary | ICD-10-CM | POA: Diagnosis not present

## 2021-11-22 DIAGNOSIS — R2689 Other abnormalities of gait and mobility: Secondary | ICD-10-CM | POA: Diagnosis not present

## 2021-11-22 DIAGNOSIS — M25551 Pain in right hip: Secondary | ICD-10-CM | POA: Diagnosis not present

## 2021-11-22 DIAGNOSIS — M25651 Stiffness of right hip, not elsewhere classified: Secondary | ICD-10-CM | POA: Diagnosis not present

## 2021-11-23 ENCOUNTER — Encounter: Payer: Self-pay | Admitting: Cardiovascular Disease

## 2021-11-23 DIAGNOSIS — Z139 Encounter for screening, unspecified: Secondary | ICD-10-CM | POA: Diagnosis not present

## 2021-11-23 DIAGNOSIS — Z1331 Encounter for screening for depression: Secondary | ICD-10-CM | POA: Diagnosis not present

## 2021-11-23 DIAGNOSIS — Z136 Encounter for screening for cardiovascular disorders: Secondary | ICD-10-CM | POA: Diagnosis not present

## 2021-11-23 DIAGNOSIS — E663 Overweight: Secondary | ICD-10-CM | POA: Diagnosis not present

## 2021-11-23 DIAGNOSIS — Z0000 Encounter for general adult medical examination without abnormal findings: Secondary | ICD-10-CM | POA: Diagnosis not present

## 2021-11-23 DIAGNOSIS — Z1339 Encounter for screening examination for other mental health and behavioral disorders: Secondary | ICD-10-CM | POA: Diagnosis not present

## 2021-11-27 DIAGNOSIS — M25551 Pain in right hip: Secondary | ICD-10-CM | POA: Diagnosis not present

## 2021-11-27 DIAGNOSIS — F319 Bipolar disorder, unspecified: Secondary | ICD-10-CM | POA: Diagnosis not present

## 2021-11-27 DIAGNOSIS — R2689 Other abnormalities of gait and mobility: Secondary | ICD-10-CM | POA: Diagnosis not present

## 2021-11-27 DIAGNOSIS — M25651 Stiffness of right hip, not elsewhere classified: Secondary | ICD-10-CM | POA: Diagnosis not present

## 2021-11-27 DIAGNOSIS — R69 Illness, unspecified: Secondary | ICD-10-CM | POA: Diagnosis not present

## 2021-11-29 DIAGNOSIS — M25651 Stiffness of right hip, not elsewhere classified: Secondary | ICD-10-CM | POA: Diagnosis not present

## 2021-11-29 DIAGNOSIS — R2689 Other abnormalities of gait and mobility: Secondary | ICD-10-CM | POA: Diagnosis not present

## 2021-11-29 DIAGNOSIS — M25551 Pain in right hip: Secondary | ICD-10-CM | POA: Diagnosis not present

## 2021-12-06 DIAGNOSIS — M25651 Stiffness of right hip, not elsewhere classified: Secondary | ICD-10-CM | POA: Diagnosis not present

## 2021-12-06 DIAGNOSIS — R2689 Other abnormalities of gait and mobility: Secondary | ICD-10-CM | POA: Diagnosis not present

## 2021-12-06 DIAGNOSIS — M25551 Pain in right hip: Secondary | ICD-10-CM | POA: Diagnosis not present

## 2021-12-10 DIAGNOSIS — E291 Testicular hypofunction: Secondary | ICD-10-CM | POA: Diagnosis not present

## 2021-12-15 DIAGNOSIS — E782 Mixed hyperlipidemia: Secondary | ICD-10-CM | POA: Diagnosis not present

## 2021-12-15 DIAGNOSIS — E1159 Type 2 diabetes mellitus with other circulatory complications: Secondary | ICD-10-CM | POA: Diagnosis not present

## 2021-12-25 DIAGNOSIS — F603 Borderline personality disorder: Secondary | ICD-10-CM | POA: Diagnosis not present

## 2021-12-25 DIAGNOSIS — R69 Illness, unspecified: Secondary | ICD-10-CM | POA: Diagnosis not present

## 2021-12-26 DIAGNOSIS — K5903 Drug induced constipation: Secondary | ICD-10-CM | POA: Diagnosis not present

## 2021-12-26 DIAGNOSIS — M25561 Pain in right knee: Secondary | ICD-10-CM | POA: Diagnosis not present

## 2021-12-26 DIAGNOSIS — G8929 Other chronic pain: Secondary | ICD-10-CM | POA: Diagnosis not present

## 2021-12-26 DIAGNOSIS — E1142 Type 2 diabetes mellitus with diabetic polyneuropathy: Secondary | ICD-10-CM | POA: Diagnosis not present

## 2021-12-26 DIAGNOSIS — Z6841 Body Mass Index (BMI) 40.0 and over, adult: Secondary | ICD-10-CM | POA: Diagnosis not present

## 2021-12-26 DIAGNOSIS — M5136 Other intervertebral disc degeneration, lumbar region: Secondary | ICD-10-CM | POA: Diagnosis not present

## 2021-12-26 DIAGNOSIS — T402X5A Adverse effect of other opioids, initial encounter: Secondary | ICD-10-CM | POA: Diagnosis not present

## 2021-12-26 DIAGNOSIS — Z7984 Long term (current) use of oral hypoglycemic drugs: Secondary | ICD-10-CM | POA: Diagnosis not present

## 2021-12-26 DIAGNOSIS — Z79899 Other long term (current) drug therapy: Secondary | ICD-10-CM | POA: Diagnosis not present

## 2021-12-26 DIAGNOSIS — Z7985 Long-term (current) use of injectable non-insulin antidiabetic drugs: Secondary | ICD-10-CM | POA: Diagnosis not present

## 2021-12-26 DIAGNOSIS — Z760 Encounter for issue of repeat prescription: Secondary | ICD-10-CM | POA: Diagnosis not present

## 2021-12-26 DIAGNOSIS — Z79891 Long term (current) use of opiate analgesic: Secondary | ICD-10-CM | POA: Diagnosis not present

## 2021-12-31 DIAGNOSIS — Z20822 Contact with and (suspected) exposure to covid-19: Secondary | ICD-10-CM | POA: Diagnosis not present

## 2021-12-31 DIAGNOSIS — J111 Influenza due to unidentified influenza virus with other respiratory manifestations: Secondary | ICD-10-CM | POA: Diagnosis not present

## 2021-12-31 DIAGNOSIS — Z6837 Body mass index (BMI) 37.0-37.9, adult: Secondary | ICD-10-CM | POA: Diagnosis not present

## 2021-12-31 DIAGNOSIS — R509 Fever, unspecified: Secondary | ICD-10-CM | POA: Diagnosis not present

## 2022-01-07 DIAGNOSIS — E291 Testicular hypofunction: Secondary | ICD-10-CM | POA: Diagnosis not present

## 2022-01-10 DIAGNOSIS — Z6838 Body mass index (BMI) 38.0-38.9, adult: Secondary | ICD-10-CM | POA: Diagnosis not present

## 2022-01-10 DIAGNOSIS — K5903 Drug induced constipation: Secondary | ICD-10-CM | POA: Diagnosis not present

## 2022-01-10 DIAGNOSIS — K5904 Chronic idiopathic constipation: Secondary | ICD-10-CM | POA: Diagnosis not present

## 2022-01-10 DIAGNOSIS — T402X5A Adverse effect of other opioids, initial encounter: Secondary | ICD-10-CM | POA: Diagnosis not present

## 2022-01-15 DIAGNOSIS — E1159 Type 2 diabetes mellitus with other circulatory complications: Secondary | ICD-10-CM | POA: Diagnosis not present

## 2022-01-15 DIAGNOSIS — E782 Mixed hyperlipidemia: Secondary | ICD-10-CM | POA: Diagnosis not present

## 2022-01-29 DIAGNOSIS — F603 Borderline personality disorder: Secondary | ICD-10-CM | POA: Diagnosis not present

## 2022-01-29 DIAGNOSIS — R69 Illness, unspecified: Secondary | ICD-10-CM | POA: Diagnosis not present

## 2022-02-03 ENCOUNTER — Other Ambulatory Visit: Payer: Self-pay | Admitting: Cardiovascular Disease

## 2022-02-04 DIAGNOSIS — E291 Testicular hypofunction: Secondary | ICD-10-CM | POA: Diagnosis not present

## 2022-02-06 ENCOUNTER — Ambulatory Visit: Payer: Medicare HMO | Admitting: Podiatrist

## 2022-02-06 ENCOUNTER — Encounter: Payer: Self-pay | Admitting: Podiatrist

## 2022-02-06 DIAGNOSIS — I739 Peripheral vascular disease, unspecified: Secondary | ICD-10-CM | POA: Diagnosis not present

## 2022-02-06 DIAGNOSIS — M79675 Pain in left toe(s): Secondary | ICD-10-CM | POA: Diagnosis not present

## 2022-02-06 DIAGNOSIS — B351 Tinea unguium: Secondary | ICD-10-CM | POA: Diagnosis not present

## 2022-02-06 DIAGNOSIS — M79674 Pain in right toe(s): Secondary | ICD-10-CM

## 2022-02-06 NOTE — Progress Notes (Signed)
Subjective: Peter Buck is a 66 y.o. male patient with history of diabetes who presents to office today with a chief concern of long,mildly painful nails  while ambulating in shoes; unable to trim.  Patient denies any new cramping, numbness, burning or tingling in the legs.  [name], MD  is his primary care provider.     Allergies  Allergen Reactions   Augmentin [Amoxicillin-Pot Clavulanate] Anaphylaxis   Fish Allergy Anaphylaxis    Specifically [place]   Iodinated Contrast Media Anaphylaxis, Hives, Other (See Comments) and Rash    Severe hypotension Developed reaction a day and a half after a lumbar nerve root block on 09/22/17.  Treated by dermatologist with prednisone taper and antihistamines. Developed reaction a day and a half after a lumbar nerve root block on 09/22/17.  Treated by dermatologist with prednisone taper and antihistamines. Developed reaction a day and a half after a lumbar nerve root block on 09/22/17.  Treated by dermatologist with prednisone taper and antihistamines.    Ketorolac Anaphylaxis   Toradol [Ketorolac Tromethamine] Anaphylaxis    "It put me in shock.  My blood pressure tanked and I was itchy all over; had to go to the ER."   Atorvastatin Other (See Comments)    Severe back ache   Guaifenesin Swelling and Other (See Comments)    "It sorta made my throat close up."    Penicillins Dermatitis   Statins Other (See Comments)    Severe back ache   Alendronate Sodium Other (See Comments)    Unknown reaction    Dextromethorphan Other (See Comments)    Unknown reaction   Pine Tar Other (See Comments)    Arms get puffy and itching   Pseudoephedrine Other (See Comments)    "It dries me out.  I take it when my other allergy medicine isn't working."     Objective: General: Patient is awake, alert, and oriented x 3 and in no acute distress.   Integument: Skin is warm, dry and supple bilateral. Nails are tender, long, thickened and dystrophic with  subungual debris, consistent with onychomycosis, 1-5 bilateral.  Dry skin bilateral.  No open lesions or preulcerative lesions present bilateral. Remaining integument unremarkable.   Vasculature:  Dorsalis Pedis pulse 1/4 bilateral. Posterior Tibial pulse  0/4 bilateral due to edema. Capillary fill time <5 sec 1-5 bilateral.  Minimal hair growth noted bilateral.  Mild varicosities present bilateral ankles with severe varicosities noted in upper thighs and legs as previously documented.  1+ pitting edema noted bilateral.   Neurology: Protective sensation intact vibratory sensation diminished bilateral.  No Babinski sign present bilateral.    Musculoskeletal: Pronated gait. Asymptomatic pes planus and hammertoe deformity noted bilateral. There is limited ankle joint range of motion bilateral consistent with equinus deformity.  No tenderness with calf compression bilateral  Assessment and Plan:   ICD-10-CM   1. Pain due to onychomycosis of toenails of both feet  B35.1    M79.675    M79.674     2. PVD (peripheral vascular disease) ([facility])  I73.9         -Mechanically debrided all nails 1-5 bilateral using sterile nail nipper and filed with dremel without incident  -Answered all patient questions -Patient to return  in 3 months for at risk foot care -Patient advised to call the office if any problems or questions arise in the meantime.  Bronson Ing, DPM

## 2022-02-07 DIAGNOSIS — R5383 Other fatigue: Secondary | ICD-10-CM | POA: Diagnosis not present

## 2022-02-07 DIAGNOSIS — E1159 Type 2 diabetes mellitus with other circulatory complications: Secondary | ICD-10-CM | POA: Diagnosis not present

## 2022-02-07 DIAGNOSIS — I152 Hypertension secondary to endocrine disorders: Secondary | ICD-10-CM | POA: Diagnosis not present

## 2022-02-07 DIAGNOSIS — Z6839 Body mass index (BMI) 39.0-39.9, adult: Secondary | ICD-10-CM | POA: Diagnosis not present

## 2022-02-07 DIAGNOSIS — E1140 Type 2 diabetes mellitus with diabetic neuropathy, unspecified: Secondary | ICD-10-CM | POA: Diagnosis not present

## 2022-02-09 DIAGNOSIS — I959 Hypotension, unspecified: Secondary | ICD-10-CM | POA: Diagnosis not present

## 2022-02-09 DIAGNOSIS — I4891 Unspecified atrial fibrillation: Secondary | ICD-10-CM | POA: Diagnosis not present

## 2022-02-09 DIAGNOSIS — R42 Dizziness and giddiness: Secondary | ICD-10-CM | POA: Diagnosis not present

## 2022-02-09 DIAGNOSIS — Z743 Need for continuous supervision: Secondary | ICD-10-CM | POA: Diagnosis not present

## 2022-02-09 DIAGNOSIS — H538 Other visual disturbances: Secondary | ICD-10-CM | POA: Diagnosis not present

## 2022-02-11 DIAGNOSIS — I4891 Unspecified atrial fibrillation: Secondary | ICD-10-CM | POA: Diagnosis not present

## 2022-02-12 DIAGNOSIS — Z789 Other specified health status: Secondary | ICD-10-CM | POA: Diagnosis not present

## 2022-02-12 DIAGNOSIS — G4733 Obstructive sleep apnea (adult) (pediatric): Secondary | ICD-10-CM | POA: Diagnosis not present

## 2022-02-15 DIAGNOSIS — M47896 Other spondylosis, lumbar region: Secondary | ICD-10-CM | POA: Diagnosis not present

## 2022-02-15 DIAGNOSIS — E1159 Type 2 diabetes mellitus with other circulatory complications: Secondary | ICD-10-CM | POA: Diagnosis not present

## 2022-02-15 DIAGNOSIS — M47816 Spondylosis without myelopathy or radiculopathy, lumbar region: Secondary | ICD-10-CM | POA: Diagnosis not present

## 2022-02-15 DIAGNOSIS — J45909 Unspecified asthma, uncomplicated: Secondary | ICD-10-CM | POA: Diagnosis not present

## 2022-02-20 DIAGNOSIS — T402X5A Adverse effect of other opioids, initial encounter: Secondary | ICD-10-CM | POA: Diagnosis not present

## 2022-02-20 DIAGNOSIS — Z6841 Body Mass Index (BMI) 40.0 and over, adult: Secondary | ICD-10-CM | POA: Diagnosis not present

## 2022-02-20 DIAGNOSIS — M25561 Pain in right knee: Secondary | ICD-10-CM | POA: Diagnosis not present

## 2022-02-20 DIAGNOSIS — K5903 Drug induced constipation: Secondary | ICD-10-CM | POA: Diagnosis not present

## 2022-02-20 DIAGNOSIS — M5136 Other intervertebral disc degeneration, lumbar region: Secondary | ICD-10-CM | POA: Diagnosis not present

## 2022-02-20 DIAGNOSIS — G8929 Other chronic pain: Secondary | ICD-10-CM | POA: Diagnosis not present

## 2022-02-20 DIAGNOSIS — R69 Illness, unspecified: Secondary | ICD-10-CM | POA: Diagnosis not present

## 2022-02-21 DIAGNOSIS — R55 Syncope and collapse: Secondary | ICD-10-CM | POA: Diagnosis not present

## 2022-02-21 DIAGNOSIS — Z6839 Body mass index (BMI) 39.0-39.9, adult: Secondary | ICD-10-CM | POA: Diagnosis not present

## 2022-02-26 DIAGNOSIS — E291 Testicular hypofunction: Secondary | ICD-10-CM | POA: Diagnosis not present

## 2022-02-28 ENCOUNTER — Telehealth: Payer: Self-pay | Admitting: Cardiovascular Disease

## 2022-02-28 NOTE — Telephone Encounter (Signed)
States they send a clearance on 9/6. Please see notes

## 2022-02-28 NOTE — Telephone Encounter (Signed)
There are no clearance requests at this time in Epic to act on for this patient.   Will route to callback team to get more information. Please route back to [facility] PREOP when request is clarified. Will remove from APP box until details are routed back for our team to act on.

## 2022-03-01 NOTE — Telephone Encounter (Signed)
Attempted to reach requesting office to obtain more information about patients clearance. Waiting on hold for 20 minutes with no answer.

## 2022-03-04 NOTE — Telephone Encounter (Signed)
I s/w requesting office. They will fax clearance request over to me at [phone] attn: pre op team

## 2022-03-04 NOTE — Telephone Encounter (Signed)
   Pre-operative Risk Assessment    Patient Name: Peter Buck  DOB: 1955/08/27 MRN: [id number]      Request for Surgical Clearance    Procedure:   GENICULAR NERVE BLOCK  AND RFA  Date of Surgery:  Clearance 03/15/22                                 Surgeon:  NOT LISTED; LEFT MESSAGE TO CALL BACK WITH NAME OF PROVIDER DOING PROCEDURE Surgeon's Group or Practice Name:  [facility] Phone number:  [phone] Fax number:  [phone] OR [phone]   Type of Clearance Requested:   - Medical  - Pharmacy:  Hold Apixaban (Eliquis) x 3 DAYS PRIOR TO PROCEDURE   Type of Anesthesia:  Not Indicated; LEFT MESSAGE TO CALL BACK TO CONFIRM IF ANY ANESTHESIA IS BEING USED.  LEFT   Additional requests/questions:    [name]   03/04/2022, 3:09 PM

## 2022-03-05 DIAGNOSIS — I152 Hypertension secondary to endocrine disorders: Secondary | ICD-10-CM | POA: Diagnosis not present

## 2022-03-05 DIAGNOSIS — E782 Mixed hyperlipidemia: Secondary | ICD-10-CM | POA: Diagnosis not present

## 2022-03-05 DIAGNOSIS — E1159 Type 2 diabetes mellitus with other circulatory complications: Secondary | ICD-10-CM | POA: Diagnosis not present

## 2022-03-05 DIAGNOSIS — R55 Syncope and collapse: Secondary | ICD-10-CM | POA: Diagnosis not present

## 2022-03-05 DIAGNOSIS — Z6838 Body mass index (BMI) 38.0-38.9, adult: Secondary | ICD-10-CM | POA: Diagnosis not present

## 2022-03-05 NOTE — Telephone Encounter (Signed)
Patient with diagnosis of afib on Eliquis for anticoagulation.    Procedure:  GENICULAR NERVE BLOCK  AND RFA Date of procedure: 03/15/22   CHA2DS2-VASc Score = 5   This indicates a 7.2% annual risk of stroke. The patient's score is based upon: CHF History: 0 HTN History: 1 Diabetes History: 1 Stroke History: 2 (reports possibly had a TIA in the past) Vascular Disease History: 0 Age Score: 1 Gender Score: 0      CrCl 66 ml/min Platelet count 175  Patient has previously been cleared to hold Eliquis for 3 days.  Per office protocol, patient can hold Eliquis for 3 days prior to procedure.    Resume as soon as safely possible.  **This guidance is not considered finalized until pre-operative APP has relayed final recommendations.**

## 2022-03-05 NOTE — Telephone Encounter (Signed)
ADDENDUM:  Requesting office called back.   Anesthesia will be used for the RFA only; Conscious sedation (versed and fentanyl)  Dr. Baird Lyons Fax # confirmed: [phone]

## 2022-03-06 ENCOUNTER — Ambulatory Visit: Payer: Medicare HMO | Attending: Physician Assistant | Admitting: Physician Assistant

## 2022-03-06 ENCOUNTER — Telehealth: Payer: Self-pay | Admitting: *Deleted

## 2022-03-06 DIAGNOSIS — Z01810 Encounter for preprocedural cardiovascular examination: Secondary | ICD-10-CM

## 2022-03-06 NOTE — Telephone Encounter (Signed)
Pt agreeable to plan of care for in office appt 03/11/22 with Coletta Memos, FNP 1:55 at [place] location. Pt thanked me for the call and the help.   I will update the requesting office

## 2022-03-06 NOTE — Telephone Encounter (Signed)
I s/w the pt, per Melina Copa, PAC pt to have tele appt today. Pt agreeable to plan of care. Consent given, though meds were not reviewed only in able to get the appt scheduled in time for the provider.   Pt tells me that he had been having some dizziness. However, pt states this was only happening in church. Pt states he saw his PCP and they figured out that it was a chair he was sitting in the choir that it was getting off circulation in his right leg. They got rid of the chair and he has been fine. Marland Kitchen

## 2022-03-06 NOTE — Telephone Encounter (Signed)
   Name:  Peter Buck  DOB:  [birth year]  MRN:  [id number]   Primary Cardiologist: Evalina Field, MD  Chart reviewed as part of pre-operative protocol coverage. Patient was contacted 03/06/2022 for virtual visit. Due to new or worsening symptoms (recent recurrent near-syncope), Peter Buck will require a follow-up visit for further pre-operative risk assessment.  Pre-op covering staff: - Please schedule appointment and call patient to inform them. He will also have his PCP fax [place] copy of their updated labs.  I already routed my note indicating recommendation to visit to requesting surgeon.  Charlie Pitter, PA-C 03/06/2022, 2:58 PM

## 2022-03-06 NOTE — Telephone Encounter (Signed)
I s/w the pt, per Melina Copa, PAC pt to have tele appt today. Pt agreeable to plan of care. Consent given, though meds were not reviewed only in able to get the appt scheduled in time for the provider.   Pt tells me that he had been having some dizziness. However, pt states this was only happening in church. Pt states he saw his PCP and they figured out that it was a chair he was sitting in the choir that it was getting off circulation in his right leg. They got rid of the chair and he has been fine. .     Patient Consent for Virtual Visit        Peter Buck has provided verbal consent on 03/06/2022 for a virtual visit (video or telephone).   CONSENT FOR VIRTUAL VISIT FOR:  Peter Buck  By participating in this virtual visit I agree to the following:  I hereby voluntarily request, consent and authorize [facility] and its employed or contracted physicians, physician assistants, nurse practitioners or other licensed health care professionals (the Practitioner), to provide me with telemedicine health care services (the "Services") as deemed necessary by the treating Practitioner. I acknowledge and consent to receive the Services by the Practitioner via telemedicine. I understand that the telemedicine visit will involve communicating with the Practitioner through live audiovisual communication technology and the disclosure of certain medical information by electronic transmission. I acknowledge that I have been given the opportunity to request an in-person assessment or other available alternative prior to the telemedicine visit and am voluntarily participating in the telemedicine visit.  I understand that I have the right to withhold or withdraw my consent to the use of telemedicine in the course of my care at any time, without affecting my right to future care or treatment, and that the Practitioner or I may terminate the telemedicine visit at any time. I understand that I have the  right to inspect all information obtained and/or recorded in the course of the telemedicine visit and may receive copies of available information for a reasonable fee.  I understand that some of the potential risks of receiving the Services via telemedicine include:  Delay or interruption in medical evaluation due to technological equipment failure or disruption; Information transmitted may not be sufficient (e.g. poor resolution of images) to allow for appropriate medical decision making by the Practitioner; and/or  In rare instances, security protocols could fail, causing a breach of personal health information.  Furthermore, I acknowledge that it is my responsibility to provide information about my medical history, conditions and care that is complete and accurate to the best of my ability. I acknowledge that Practitioner's advice, recommendations, and/or decision may be based on factors not within their control, such as incomplete or inaccurate data provided by me or distortions of diagnostic images or specimens that may result from electronic transmissions. I understand that the practice of medicine is not an exact science and that Practitioner makes no warranties or guarantees regarding treatment outcomes. I acknowledge that a copy of this consent can be made available to me via my patient portal ([facility]), or I can request a printed copy by calling the office of [facility].    I understand that my insurance will be billed for this visit.   I have read or had this consent read to me. I understand the contents of this consent, which adequately explains the benefits and risks of the Services being provided via telemedicine.  I have been provided ample opportunity to ask questions regarding this consent and the Services and have had my questions answered to my satisfaction. I give my informed consent for the services to be provided through the use of telemedicine in my medical  care

## 2022-03-06 NOTE — Progress Notes (Signed)
Virtual Visit via Telephone Note   Because of Peter Buck's co-morbid illnesses, he is at least at moderate risk for complications without adequate follow up.  This format is felt to be most appropriate for this patient at this time.  The patient did not have access to video technology/had technical difficulties with video requiring transitioning to audio format only (telephone).  All issues noted in this document were discussed and addressed.  No physical exam could be performed with this format.  Please refer to the patient's chart for his consent to telehealth for [facility].  Evaluation Performed:  Preoperative cardiovascular risk assessment _____________   Date:  03/06/2022   Patient ID:  Peter Buck, DOB 1956/04/30, MRN [id number] Patient Location:  Home Provider location:   Office  Primary Care Provider:  [name], MD Primary Cardiologist:  Peter Field, MD  Chief Complaint / Patient Profile   66 y.o. y/o male with a h/o persistent Afib, obesity, OSA, low risk MPI 10/2019, HTN, DM, HLD who is pending GENICULAR NERVE BLOCK  AND RFA with conscious sedation and presents today for telephonic preoperative cardiovascular risk assessment.  Past Medical History    Past Medical History:  Diagnosis Date   Aftercare following surgery 04/23/2018   Anemia    Arthritis    Arthritis, midfoot 12/05/2016   Asthma    Bipolar affective disorder, depressed ([place]) 02/13/2016   Bipolar disorder ([place])    Body mass index (bmi) 38.0-38.9, adult 09/16/2017   Body mass index (BMI) 38.0-38.9, adult 09/16/2017   Formatting of this note might be different from the original. IMO 10/01 Updates   Borderline personality disorder ([place]) 04/29/2016   Cervical radiculopathy 02/02/2019   Last Assessment & Plan:  Formatting of this note might be different from the original. MRI of the cervical spine shows cervical spondylosis with facet arthrosis most notable for mild to moderate left neural  foraminal stenosis at C3-4.  Per patient, nerve conduction studies showed right C7-8 radiculopathy.    Continue with current regimen as outlined in lumbar degenerative disc plan.   Chronic pain of left knee 10/19/2019   Chronic pain syndrome 02/02/2019   Comprehensive diabetic foot examination, type 2 DM, encounter for (Peter Buck) 12/25/2015   DDD (degenerative disc disease), lumbar 11/25/2017   Last Assessment & Plan:  Formatting of this note might be different from the original. 66 year old male with chronic, predominantly left-sided low back pain with radiation into the left hip and groin.  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 male with Low back pain, and acute on chronic low back pain with left hip, knee, and foot pain.  Today I will treat with the patient's current medication of tramadol and diclofenac and add relafen (temporarily d/c diclofenac) for 10 days and increase lyrica. He will continue with his current appt with his neurosurgery.  [name] will return to  Muscle spasm of back 02/17/2018   Last Assessment & Plan:  Formatting of this note might be different from the original. Continue with tizanidine 4 mg as needed for muscle spasming up to 3 times daily.   Neuroforaminal stenosis of lumbar spine 12/17/2018   Onychomycosis due to dermatophyte 12/25/2015   Pain associated with  accessory navicular bone of foot, left 05/25/2018   Pain medication agreement 10/16/2018   Last Assessment & Plan:  Formatting of this note might be different from the original. Review of prior UDS results are within normal limits.  [place] controlled substance registry reviewed and there were no inconsistencies noted.   Paroxysmal atrial fibrillation ([place]) 12/23/2019   Plantar fasciitis 01/02/2017   Primary osteoarthritis of both first carpometacarpal joints 08/25/2019   Second degree burn of finger of right hand 08/25/2019   Sleep apnea    Therapeutic opioid induced constipation 06/22/2019   Last Assessment & Plan:  Formatting of this note might be different from the original. Continue amitiza 70mg b.i.d.   [place] 08/25/2019   Past Surgical History:  Procedure Laterality Date   APPENDECTOMY     CARDIOVERSION N/A 07/05/2020   Procedure: CARDIOVERSION;  Surgeon: OGeralynn Rile MD;  [place] [facility]  Service: Cardiovascular;  Laterality: N/A;   CARDIOVERSION N/A 10/10/2021   Procedure: CARDIOVERSION;  Surgeon: Peter Perla MD;  [facility];  Service: Cardiovascular;  Laterality: N/A;   CHOLECYSTECTOMY     INNER EAR SURGERY     KNEE ARTHROSCOPY     TEE WITHOUT CARDIOVERSION N/A 10/10/2021   Procedure: TRANSESOPHAGEAL ECHOCARDIOGRAM (TEE);  Surgeon: Peter Perla MD;  [facility]  Service: Cardiovascular;  Laterality: N/A;    Allergies  Allergies  Allergen Reactions   Augmentin [Amoxicillin-Pot Clavulanate] Anaphylaxis   Fish Allergy Anaphylaxis    Specifically Salmon   Iodinated Contrast Media Anaphylaxis, Hives, Other (See Comments) and Rash    Severe hypotension Developed reaction a day and a half after a lumbar nerve root block on 09/22/17.  Treated by dermatologist with prednisone taper and antihistamines. Developed reaction a day and a half after a lumbar nerve root block on 09/22/17.  Treated by dermatologist with prednisone taper and  antihistamines. Developed reaction a day and a half after a lumbar nerve root block on 09/22/17.  Treated by dermatologist with prednisone taper and antihistamines.    Ketorolac Anaphylaxis   Toradol [Ketorolac Tromethamine] Anaphylaxis    "It put me in shock.  My blood pressure tanked and I was itchy all over; had to go to the ER."   Atorvastatin Other (See Comments)    Severe back ache   Guaifenesin Swelling and Other (See Comments)    "It sorta made my throat close up."    Penicillins Dermatitis   Statins Other (See Comments)    Severe back ache   Alendronate Sodium Other (See Comments)    Unknown reaction    Dextromethorphan Other (See Comments)    Unknown reaction   Pine Tar Other (See Comments)    Arms get puffy and itching   Pseudoephedrine Other (See Comments)    "It dries me out.  I take it when my other allergy medicine isn't working."    History of Present Illness    Peter Brinkis a 66y.o. male who presents via audio/video conferencing for a telehealth visit today.  Pt was last seen in cardiology clinic on 10/26/21 by Dr. OAudie Box  At that time EHuxley Shurleywas doing  well following his last cardioversion in 10/10/21.  The patient is now pending procedure as outlined above. Since his last visit, he reports he's had several episodes of near-syncope occurring at church. He saw PCP and felt like it may have been related to specific chair he was sitting in cutting off circulation in his leg. Also evaluated at outside facility in August for report of heat exhaustion found to have AKI at that time. We discussed that given symptoms he needs in office eval before clearing.   Home Medications    Prior to Admission medications   Medication Sig Start Date End Date Taking? Authorizing Provider  acetaminophen (TYLENOL) 500 MG tablet Take 500 mg by mouth every 6 (six) hours as needed for moderate pain or mild pain.    [provider]  betamethasone dipropionate 0.05 % cream  Apply 1 application. topically daily as needed (itching).    [provider]  Calcium Citrate-Vitamin D (CALCIUM CITRATE + D3 PO) Take 1 tablet by mouth at bedtime. 630 mg /12.5 mcg    [provider]  celecoxib (CELEBREX) 200 MG capsule Take 200 mg by mouth 2 (two) times daily. 11/15/19   [provider]  diclofenac Sodium (VOLTAREN) 1 % GEL Apply 1 g topically 4 (four) times daily as needed (pain).    [provider]  dronedarone (MULTAQ) 400 MG tablet Take 1 tablet (400 mg total) by mouth 2 (two) times daily with a meal. 09/05/21   O'Neal, Cassie Freer, MD  ELIQUIS 5 MG TABS tablet TAKE 1 TABLET BY MOUTH TWICE A DAY 06/04/21   O'Neal, Cassie Freer, MD  EPINEPHrine 0.3 mg/0.3 mL IJ SOAJ injection Inject into the muscle.    [provider]  fexofenadine (ALLEGRA) 180 MG tablet Take by mouth.    [provider]  FIBER ADULT GUMMIES PO Take 10 g by mouth daily.    [provider]  [place] 300 MG CAPS Take 300 mg by mouth in the morning and at bedtime.    [provider]  HYDROcodone-acetaminophen (NORCO/VICODIN) 5-325 MG tablet Take 1 tablet by mouth every 12 (twelve) hours as needed for moderate pain.    [provider]  Magnesium Citrate 200 MG TABS Take 200 mg by mouth daily. Gummies    [provider]  mometasone (NASONEX) 50 MCG/ACT nasal spray Place 1 spray into the nose daily.    [provider]  nebivolol (BYSTOLIC) 10 MG tablet Take 1 tablet by mouth twice daily 02/04/22   O'Neal, Cassie Freer, MD  Oxcarbazepine (TRILEPTAL) 300 MG tablet Take 300 mg by mouth 2 (two) times daily. 03/13/20   [provider]  OZEMPIC, 1 MG/DOSE, 4 MG/3ML SOPN Inject 1 mg into the muscle once a week. INJECT THE CONTENTS OF 1 PEN INJECTOR SUBQ ONCE WEEKLY 02/08/20   [provider]  pregabalin (LYRICA) 150 MG capsule Take 150 mg by mouth 2 (two) times daily.    [provider]   RELISTOR 150 MG TABS Take 3 tablets by mouth daily. 09/29/20   [provider]  rosuvastatin (CRESTOR) 20 MG tablet Take 1 tablet (20 mg total) by mouth daily. Patient taking differently: Take 20 mg by mouth in the morning. 09/05/21   O'Neal, Cassie Freer, MD  testosterone cypionate (DEPOTESTOSTERONE CYPIONATE) 200 MG/ML injection Inject 200 mg into the muscle every 21 ( twenty-one) days. 10/22/19   [provider]  valsartan (DIOVAN) 160 MG tablet Take 80-160 mg by mouth See admin  instructions. Take 80 mg in the morning and 160 mg at bedtime    [provider]    Physical Exam    Vital Signs:  [place] does not have vital signs available for review today.  Given telephonic nature of communication, physical exam is limited. AAOx3. NAD. Normal affect.  Speech and respirations are unlabored.  Accessory Clinical Findings    None  Assessment & Plan    1.  Preoperative Cardiovascular Risk Assessment: Given new symptoms, requires in-office visit for evaluation and clearance. Will route request to callback team in original phone note. A copy of this note will be routed to requesting surgeon.  Time:   Today, I have spent 5 minutes with the patient with telehealth technology discussing medical history, symptoms, and management plan. No charge given need for OV.   Charlie Pitter, PA-C  03/06/2022, 2:45 PM

## 2022-03-06 NOTE — Telephone Encounter (Signed)
   Name: Peter Buck  DOB: [birth year]  MRN: [id number]  Primary Cardiologist: Evalina Field, MD   Preoperative team, please contact this patient and set up a phone call appointment for further preoperative risk assessment. Please obtain consent and complete medication review. Thank you for your help. Time sensitive.  I confirm that guidance regarding antiplatelet and oral anticoagulation therapy has been completed and, if necessary, noted below.   Charlie Pitter, PA-C 03/06/2022, 2:30 PM [place]

## 2022-03-10 NOTE — Progress Notes (Signed)
Cardiology Clinic Note   Patient Name: Peter Buck Date of Encounter: 03/11/2022  Primary Care Provider:  [name], MD Primary Cardiologist:  Evalina Field, MD  Patient Profile    Peter Buck 66 year old male presents to the clinic today for follow-up evaluation of his dizziness and preoperative cardiac evaluation.  Past Medical History    Past Medical History:  Diagnosis Date   Aftercare following surgery 04/23/2018   Anemia    Arthritis    Arthritis, midfoot 12/05/2016   Asthma    Bipolar affective disorder, depressed ([place]) 02/13/2016   Bipolar disorder ([place])    Body mass index (bmi) 38.0-38.9, adult 09/16/2017   Body mass index (BMI) 38.0-38.9, adult 09/16/2017   Formatting of this note might be different from the original. IMO 10/01 Updates   Borderline personality disorder ([place]) 04/29/2016   Cervical radiculopathy 02/02/2019   Last Assessment & Plan:  Formatting of this note might be different from the original. MRI of the cervical spine shows cervical spondylosis with facet arthrosis most notable for mild to moderate left neural foraminal stenosis at C3-4.  Per patient, nerve conduction studies showed right C7-8 radiculopathy.    Continue with current regimen as outlined in lumbar degenerative disc plan.   Chronic pain of left knee 10/19/2019   Chronic pain syndrome 02/02/2019   Comprehensive diabetic foot examination, type 2 DM, encounter for ([place]) 12/25/2015   DDD (degenerative disc disease), lumbar 11/25/2017   Last Assessment & Plan:  Formatting of this note might be different from the original. 66 year old male with chronic, predominantly left-sided low back pain with radiation into the left hip and groin.  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 male with Low back pain, and acute on chronic low back pain with left hip, knee, and foot pain.  Today I will treat with the patient's current medication of tramadol and diclofenac and add relafen (temporarily d/c diclofenac) for 10 days and increase lyrica. He will continue with his current appt with his neurosurgery.  Peter Buck will return to    Muscle spasm of back 02/17/2018   Last Assessment & Plan:  Formatting of this note might be different from the original. Continue with tizanidine 4 mg as needed for muscle spasming up to 3 times daily.   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 Review of prior UDS results are within normal limits.  [place] controlled substance registry reviewed and there were no inconsistencies noted.   Paroxysmal atrial fibrillation ([place]) 12/23/2019   Plantar fasciitis 01/02/2017   Primary osteoarthritis of both first carpometacarpal joints 08/25/2019   Second degree burn of finger  of right hand 08/25/2019   Sleep apnea    Therapeutic opioid induced constipation 06/22/2019   Last Assessment & Plan:  Formatting of this note might be different from the original. Continue amitiza 97mg b.i.d.   [place] 08/25/2019   Past Surgical History:  Procedure Laterality Date   APPENDECTOMY     CARDIOVERSION N/A 07/05/2020    Procedure: CARDIOVERSION;  Surgeon: OGeralynn Rile MD;  [facility]  Service: Cardiovascular;  Laterality: N/A;   CARDIOVERSION N/A 10/10/2021   Procedure: CARDIOVERSION;  Surgeon: CLelon Perla MD;  [facility]  Service: Cardiovascular;  Laterality: N/A;   CHOLECYSTECTOMY     INNER EAR SURGERY     KNEE ARTHROSCOPY     TEE WITHOUT CARDIOVERSION N/A 10/10/2021   Procedure: TRANSESOPHAGEAL ECHOCARDIOGRAM (TEE);  Surgeon: CLelon Perla MD;  [facility]  Service: Cardiovascular;  Laterality: N/A;    Allergies  Allergies  Allergen Reactions   Augmentin [Amoxicillin-Pot Clavulanate] Anaphylaxis   Fish Allergy Anaphylaxis    Specifically Salmon   Iodinated Contrast Media Anaphylaxis, Hives, Other (See Comments) and Rash    Severe hypotension Developed reaction a day and a half after a lumbar nerve root block on 09/22/17.  Treated by dermatologist with prednisone taper and antihistamines. Developed reaction a day and a half after a lumbar nerve root block on 09/22/17.  Treated by dermatologist with prednisone taper and antihistamines. Developed reaction a day and a half after a lumbar nerve root block on 09/22/17.  Treated by dermatologist with prednisone taper and antihistamines.    Ketorolac Anaphylaxis   Toradol [Ketorolac Tromethamine] Anaphylaxis    "It put me in shock.  My blood pressure tanked and I was itchy all over; had to go to the ER."   Atorvastatin Other (See Comments)    Severe back ache   Guaifenesin Swelling and Other (See Comments)    "It sorta made my throat close up."    Penicillins Dermatitis   Statins Other (See Comments)    Severe back ache   Alendronate Sodium Other (See Comments)    Unknown reaction    Dextromethorphan Other (See Comments)    Unknown reaction   Pine Tar Other (See Comments)    Arms get puffy and itching   Pseudoephedrine Other (See Comments)    "It dries me out.  I take it when my other allergy  medicine isn't working."    History of Present Illness    Peter Bradishis a PMH of essential hypertension, paroxysmal atrial fibrillation, chronic venous insufficiency, asthma, sleep apnea, GERD, diabetes, degenerative disc disease, arthritis, insomnia, anemia, bipolar disorder, obesity and chronic opioid use.  He was evaluated by DMelina Buck on 03/06/2022 via phone visit.  He was previously seen in the cardiology clinic by Dr. OAudie Boxon 10/26/2021.  During that time he was doing well post cardioversion that had been on 10/10/2021.  During his phone evaluation he reported several episodes of near syncope which occurred at church.  He was seen by his PCP who felt that they had been related to a chair he had been sitting in affecting the circulation in his lower extremities.  Of note, he had also been seen and evaluated at an outside facility in August for reports of heat exhaustion and was found to have AKI at that time.  An in office cardiac evaluation was felt to be more appropriate.  He presents to the clinic today for follow-up evaluation.He is pending genicular nerve block  and RFA with conscious sedation.  He states he continues to notice some lightheadedness on Sundays when he is at church.  He sings in the choir.  He has not noticed any dizziness any other time throughout the week.  He reports that these episodes started happening about 1 month ago after he became dehydrated and suffered heat exhaustion.  His blood pressure today is 92/54.  We reviewed his medications.  I believe that this dizziness is related to his low blood pressure.  I will decrease his valsartan to 80 mg in the morning and 80 mg in the evening/before bed.  We will have him increase his p.o. hydration.  We reviewed his upcoming surgery.  We will have him follow-up as scheduled in about 3 months.  Today the denies chest pain, shortness of breath, lower extremity edema, fatigue, palpitations, melena, hematuria, hemoptysis,  diaphoresis, weakness, orthopnea, and PND.      Home Medications    Prior to Admission medications   Medication Sig Start Date End Date Taking? Authorizing Provider  acetaminophen (TYLENOL) 500 MG tablet Take 500 mg by mouth every 6 (six) hours as needed for moderate pain or mild pain.    [provider]  betamethasone dipropionate 0.05 % cream Apply 1 application. topically daily as needed (itching).    [provider]  Calcium Citrate-Vitamin D (CALCIUM CITRATE + D3 PO) Take 1 tablet by mouth at bedtime. 630 mg /12.5 mcg    [provider]  celecoxib (CELEBREX) 200 MG capsule Take 200 mg by mouth 2 (two) times daily. 11/15/19   [provider]  diclofenac Sodium (VOLTAREN) 1 % GEL Apply 1 g topically 4 (four) times daily as needed (pain).    [provider]  dronedarone (MULTAQ) 400 MG tablet Take 1 tablet (400 mg total) by mouth 2 (two) times daily with a meal. 09/05/21   O'Neal, Cassie Freer, MD  ELIQUIS 5 MG TABS tablet TAKE 1 TABLET BY MOUTH TWICE A DAY 06/04/21   O'Neal, Cassie Freer, MD  EPINEPHrine 0.3 mg/0.3 mL IJ SOAJ injection Inject into the muscle.    [provider]  fexofenadine (ALLEGRA) 180 MG tablet Take by mouth.    [provider]  FIBER ADULT GUMMIES PO Take 10 g by mouth daily.    [provider]  [place] 300 MG CAPS Take 300 mg by mouth in the morning and at bedtime.    [provider]  HYDROcodone-acetaminophen (NORCO/VICODIN) 5-325 MG tablet Take 1 tablet by mouth every 12 (twelve) hours as needed for moderate pain.    [provider]  Magnesium Citrate 200 MG TABS Take 200 mg by mouth daily. Gummies    [provider]  mometasone (NASONEX) 50 MCG/ACT nasal spray Place 1 spray into the nose daily.    [provider]  nebivolol (BYSTOLIC) 10 MG tablet Take 1 tablet by mouth twice daily 02/04/22   O'Neal, Cassie Freer, MD  Oxcarbazepine (TRILEPTAL) 300 MG  tablet Take 300 mg by mouth 2 (two) times daily. 03/13/20   [provider]  OZEMPIC, 1 MG/DOSE, 4 MG/3ML SOPN Inject 1 mg into the muscle once a week. INJECT THE CONTENTS OF 1 PEN INJECTOR SUBQ ONCE WEEKLY 02/08/20   [provider]  pregabalin (LYRICA) 150 MG capsule Take 150 mg by mouth 2 (two) times daily.    [provider]  RELISTOR 150 MG TABS Take 3 tablets by mouth daily. 09/29/20   [provider]  rosuvastatin (  CRESTOR) 20 MG tablet Take 1 tablet (20 mg total) by mouth daily. Patient taking differently: Take 20 mg by mouth in the morning. 09/05/21   O'Neal, Cassie Freer, MD  testosterone cypionate (DEPOTESTOSTERONE CYPIONATE) 200 MG/ML injection Inject 200 mg into the muscle every 21 ( twenty-one) days. 10/22/19   [provider]  valsartan (DIOVAN) 160 MG tablet Take 80-160 mg by mouth See admin instructions. Take 80 mg in the morning and 160 mg at bedtime    [provider]    Family History    Family History  Problem Relation Age of Onset   Thyroid disease Mother    Macular degeneration Mother    He indicated that his mother is deceased. He indicated that his father is deceased. He indicated that both of his brothers are alive.  Social History    Social History   Socioeconomic History   Marital status: Divorced    Spouse name: Not on file   Number of children: Not on file   Years of education: Not on file   Highest education level: Not on file  Occupational History   Occupation: retired  Tobacco Use   Smoking status: Never   Smokeless tobacco: Never  Vaping Use   Vaping Use: Never used  Substance and Sexual Activity   Alcohol use: No   Drug use: No   Sexual activity: Not on file  Other Topics Concern   Not on file  Social History Narrative   Not on file   Social Determinants of Health   Financial Resource Strain: Not on file  Food Insecurity: Not on file  Transportation Needs: Not on file  Physical  Activity: Not on file  Stress: Not on file  Social Connections: Not on file  Intimate Partner Violence: Not on file     Review of Systems    General:  No chills, fever, night sweats or weight changes.  Cardiovascular:  No chest pain, dyspnea on exertion, edema, orthopnea, palpitations, paroxysmal nocturnal dyspnea. Dermatological: No rash, lesions/masses Respiratory: No cough, dyspnea Urologic: No hematuria, dysuria Abdominal:   No nausea, vomiting, diarrhea, bright red blood per rectum, melena, or hematemesis Neurologic:  No visual changes, wkns, changes in mental status. All other systems reviewed and are otherwise negative except as noted above.  Physical Exam    VS:  BP (!) 92/54 (BP Location: Left Arm, Patient Position: Sitting, Cuff Size: Large)   Pulse 71   Ht '6\' 4"'$  (1.93 m)   Wt (!) 318 lb (144.2 kg)   BMI 38.71 kg/m  , BMI Body mass index is 38.71 kg/m. GEN: Well nourished, well developed, in no acute distress. HEENT: normal. Neck: Supple, no JVD, carotid bruits, or masses. Cardiac: RRR, no murmurs, rubs, or gallops. No clubbing, cyanosis, generalized bilateral lower extremity nonpitting edema.  Radials/DP/PT 2+ and equal bilaterally.  Respiratory:  Respirations regular and unlabored, clear to auscultation bilaterally. GI: Soft, nontender, nondistended, BS + x 4. MS: no deformity or atrophy. Skin: warm and dry, no rash. Neuro:  Strength and sensation are intact. Psych: Normal affect.  Accessory Clinical Findings    Recent Labs: 08/28/2021: BUN 18; Creatinine, Ser 1.13; Hemoglobin 17.0; Magnesium 2.2; Platelets 190; Potassium 5.1; Sodium 141; TSH 1.010   Recent Lipid Panel No results found for: "CHOL", "TRIG", "HDL", "CHOLHDL", "VLDL", "LDLCALC", "LDLDIRECT"       ECG personally reviewed by me today-atrial fibrillation 90 bpm- No acute changes  Echocardiogram 10/10/21 IMPRESSIONS     1. Left ventricular  ejection fraction, by estimation, is 55 to 60%. The   left ventricle has normal function. The left ventricle has no regional  wall motion abnormalities.   2. Right ventricular systolic function is normal. The right ventricular  size is mildly enlarged.   3. Left atrial size was mildly dilated. No left atrial/left atrial  appendage thrombus was detected.   4. Right atrial size was mildly dilated.   5. The mitral valve is normal in structure. Trivial mitral valve  regurgitation.   6. The aortic valve is normal in structure. Aortic valve regurgitation is  not visualized.   Assessment & Plan   1.  Paroxysmal atrial fibrillation-EKG today shows atrial fibrillation 90 bpm.  CHA2DS2-VASc score 5 (age, DM, HTN, TIA) underwent TEE/DCCV 10/10/2021 reports compliance with apixaban and denies bleeding issues. Continue dronedarone, apixaban Heart healthy low-sodium diet-salty 6 given Increase physical activity as tolerated  Hyperlipidemia-LDL47 on 09/14/2021 Continue rosuvastatin Heart healthy low-sodium diet-salty 6 given Increase physical activity as tolerated  Morbid obesity-weight today 318 pounds Continue heart healthy low-sodium diet Increase physical activity as tolerated Continue weight loss  Preoperative cardiac evaluation- GENICULAR NERVE BLOCK  AND RFA, [facility] [place] [facility], 03/15/2022     Primary Cardiologist: Evalina Field, MD  Chart reviewed as part of pre-operative protocol coverage. Given past medical history and time since last visit, based on ACC/AHA guidelines, Peter Buck would be at acceptable risk for the planned procedure without further cardiovascular testing.   Patient was advised that if he develops new symptoms prior to surgery to contact our office to arrange a follow-up appointment.  He verbalized understanding.  Patient with diagnosis of afib on Eliquis for anticoagulation.     Procedure:  GENICULAR NERVE BLOCK  AND RFA Date of procedure: 03/15/22     CHA2DS2-VASc Score = 5   This  indicates a 7.2% annual risk of stroke. The patient's score is based upon: CHF History: 0 HTN History: 1 Diabetes History: 1 Stroke History: 2 (reports possibly had a TIA in the past) Vascular Disease History: 0 Age Score: 1 Gender Score: 0       CrCl 66 ml/min Platelet count 175   Patient has previously been cleared to hold Eliquis for 3 days.   Per office protocol, patient can hold Eliquis for 3 days prior to procedure.     Resume as soon as safely possible.    Disposition: Follow-up with Dr. [name] in 4 to 6 months.  Jossie Ng. Jeremi Losito NP-C     03/11/2022, 2:41 PM [place] [address] Office [phone] Fax [phone]  Notice: This dictation was prepared with Dragon dictation along with smaller phrase technology. Any transcriptional errors that result from this process are unintentional and may not be corrected upon review.  I spent 13 minutes examining this patient, reviewing medications, and using patient centered shared decision making involving her cardiac care.  Prior to her visit I spent greater than 20 minutes reviewing her past medical history,  medications, and prior cardiac tests.

## 2022-03-11 ENCOUNTER — Encounter: Payer: Self-pay | Admitting: General Practice

## 2022-03-11 ENCOUNTER — Ambulatory Visit: Payer: Medicare HMO | Attending: General Practice | Admitting: General Practice

## 2022-03-11 VITALS — BP 92/54 | HR 71 | Ht 76.0 in | Wt 318.0 lb

## 2022-03-11 DIAGNOSIS — I480 Paroxysmal atrial fibrillation: Secondary | ICD-10-CM | POA: Diagnosis not present

## 2022-03-11 DIAGNOSIS — E782 Mixed hyperlipidemia: Secondary | ICD-10-CM | POA: Diagnosis not present

## 2022-03-11 DIAGNOSIS — Z01810 Encounter for preprocedural cardiovascular examination: Secondary | ICD-10-CM

## 2022-03-11 MED ORDER — VALSARTAN 80 MG PO TABS: 80.0000 mg | ORAL_TABLET | ORAL | 6 refills | Status: DC

## 2022-03-11 NOTE — Addendum Note (Signed)
Addended by: Waylan Rocher on: 03/11/2022 03:15 PM   Modules accepted: Orders

## 2022-03-11 NOTE — Patient Instructions (Addendum)
Medication Instructions:  DECREASE VALSARTAN '80MG'$  TWICE DAILY  *If you need a refill on your cardiac medications before your next appointment, please call your pharmacy*   Lab Work: NONE If you have labs (blood work) drawn today and your tests are completely normal, you will receive your results only by: [place] (if you have MyChart) OR A paper copy in the mail If you have any lab test that is abnormal or we need to change your treatment, we will call you to review the results.  Other Instructions OK FOR YOUR PROCEDURE  Follow-Up: At [facility], you and your health needs are our priority.  As part of our continuing mission to provide you with exceptional heart care, we have created designated Provider Care Teams.  These Care Teams include your primary Cardiologist (physician) and Advanced Practice Providers (APPs -  Physician Assistants and Nurse Practitioners) who all work together to provide you with the care you need, when you need it.  Your next appointment:   KEEP SCHEDULED APPOINTMENT   The format for your next appointment:   In Person  Provider:   Evalina Field, MD    Important Information About Sugar

## 2022-03-12 LAB — BASIC METABOLIC PANEL
BUN/Creatinine Ratio: 20 (ref 10–24)
BUN: 25 mg/dL (ref 8–27)
CO2: 28 mmol/L (ref 20–29)
Calcium: 9.7 mg/dL (ref 8.6–10.2)
Chloride: 101 mmol/L (ref 96–106)
Creatinine, Ser: 1.27 mg/dL (ref 0.76–1.27)
Glucose: 69 mg/dL — ABNORMAL LOW (ref 70–99)
Potassium: 5.3 mmol/L — ABNORMAL HIGH (ref 3.5–5.2)
Sodium: 139 mmol/L (ref 134–144)
eGFR: 62 mL/min/{1.73_m2} (ref >59)

## 2022-03-14 DIAGNOSIS — R159 Full incontinence of feces: Secondary | ICD-10-CM | POA: Diagnosis not present

## 2022-03-14 DIAGNOSIS — M7631 Iliotibial band syndrome, right leg: Secondary | ICD-10-CM | POA: Diagnosis not present

## 2022-03-14 DIAGNOSIS — Z6838 Body mass index (BMI) 38.0-38.9, adult: Secondary | ICD-10-CM | POA: Diagnosis not present

## 2022-03-14 DIAGNOSIS — M21372 Foot drop, left foot: Secondary | ICD-10-CM | POA: Diagnosis not present

## 2022-03-17 DIAGNOSIS — J45909 Unspecified asthma, uncomplicated: Secondary | ICD-10-CM | POA: Diagnosis not present

## 2022-03-17 DIAGNOSIS — E1159 Type 2 diabetes mellitus with other circulatory complications: Secondary | ICD-10-CM | POA: Diagnosis not present

## 2022-03-18 DIAGNOSIS — M5416 Radiculopathy, lumbar region: Secondary | ICD-10-CM | POA: Diagnosis not present

## 2022-03-20 DIAGNOSIS — M5451 Vertebrogenic low back pain: Secondary | ICD-10-CM | POA: Diagnosis not present

## 2022-03-21 DIAGNOSIS — Z6838 Body mass index (BMI) 38.0-38.9, adult: Secondary | ICD-10-CM | POA: Diagnosis not present

## 2022-03-21 DIAGNOSIS — R159 Full incontinence of feces: Secondary | ICD-10-CM | POA: Diagnosis not present

## 2022-03-21 DIAGNOSIS — R809 Proteinuria, unspecified: Secondary | ICD-10-CM | POA: Diagnosis not present

## 2022-03-21 DIAGNOSIS — E1129 Type 2 diabetes mellitus with other diabetic kidney complication: Secondary | ICD-10-CM | POA: Diagnosis not present

## 2022-03-26 DIAGNOSIS — F603 Borderline personality disorder: Secondary | ICD-10-CM | POA: Diagnosis not present

## 2022-03-26 DIAGNOSIS — R69 Illness, unspecified: Secondary | ICD-10-CM | POA: Diagnosis not present

## 2022-03-29 DIAGNOSIS — E291 Testicular hypofunction: Secondary | ICD-10-CM | POA: Diagnosis not present

## 2022-04-09 ENCOUNTER — Other Ambulatory Visit: Payer: Self-pay | Admitting: Cardiovascular Disease

## 2022-04-15 NOTE — Progress Notes (Unsigned)
Cardiology Office Note:   Date:  04/16/2022  NAME:  Peter Buck    MRN: [id number] DOB:  10-22-1955   PCP:  [name], MD  Cardiologist:  Evalina Field, MD  Electrophysiologist:  None   Referring MD: [name], MD   Chief Complaint  Patient presents with   Follow-up        History of Present Illness:   Peter Buck is a 66 y.o. male with a hx of persistent Afib, HTN, DM, obesity who presents for follow-up.  Reports he is doing well.  EKG from 03/11/2022 shows he is in A-fib.  Rhythm sounds regular today.  EKG shows he is back in A-fib.  Denies any symptoms from his A-fib.  BP 106/76.  He does report some pain in his legs.  2+ pulses.  I suspect this is neuropathy.  No bleeding on Eliquis.  Weight has gone down a bit.  Not exercising due to arthritis.  Problem List 1. Persistent Atrial fibrillation -CHADSVASC=5 (age, DM, HTN, TIA) -failed DCCV 10/2019 -MPI normal 10/2019 (breast attenuation) -DCCV 07/05/2020 -on dronedarone  -recurrence 09/2021 -> TEE/DCCV 10/10/2021 -recurrence 03/11/2022 2. Morbid obesity -BMI 40 3. OSA 4. HTN 5. DM -A1c 5.8 6. HLD -T chol 123, HDL 45, LDL 55, TG 128  Past Medical History: Past Medical History:  Diagnosis Date   Aftercare following surgery 04/23/2018   Anemia    Arthritis    Arthritis, midfoot 12/05/2016   Asthma    Bipolar affective disorder, depressed ([place]) 02/13/2016   Bipolar disorder ([place])    Body mass index (bmi) 38.0-38.9, adult 09/16/2017   Body mass index (BMI) 38.0-38.9, adult 09/16/2017   Formatting of this note might be different from the original. IMO 10/01 Updates   Borderline personality disorder ([place]) 04/29/2016   Cervical radiculopathy 02/02/2019   Last Assessment & Plan:  Formatting of this note might be different from the original. MRI of the cervical spine shows cervical spondylosis with facet arthrosis most notable for mild to moderate left neural foraminal stenosis at C3-4.  Per patient, nerve  conduction studies showed right C7-8 radiculopathy.    Continue with current regimen as outlined in lumbar degenerative disc plan.   Chronic pain of left knee 10/19/2019   Chronic pain syndrome 02/02/2019   Comprehensive diabetic foot examination, type 2 DM, encounter for ([place]) 12/25/2015   DDD (degenerative disc disease), lumbar 11/25/2017   Last Assessment & Plan:  Formatting of this note might be different from the original. 66 year old male with chronic, predominantly left-sided low back pain with radiation into the left hip and groin.  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 male with Low back pain, and acute on chronic low back pain with left hip, knee, and foot pain.  Today I will treat with the patient's current medication of tramadol and diclofenac and add relafen (temporarily d/c diclofenac) for  10 days and increase lyrica. He will continue with his current appt with his neurosurgery.  Mr. Buckman will return to    Muscle spasm of back 02/17/2018   Last Assessment & Plan:  Formatting of this note might be different from the original. Continue with tizanidine 4 mg as needed for muscle spasming up to 3 times daily.   Neuroforaminal stenosis of lumbar spine 12/17/2018   Onychomycosis due to dermatophyte 12/25/2015   Pain associated with accessory navicular bone of foot, left 05/25/2018   Pain  medication agreement 10/16/2018   Last Assessment & Plan:  Formatting of this note might be different from the original. Review of prior UDS results are within normal limits.  [place] controlled substance registry reviewed and there were no inconsistencies noted.   Paroxysmal atrial fibrillation ([place]) 12/23/2019   Plantar fasciitis 01/02/2017   Primary osteoarthritis of both first carpometacarpal joints 08/25/2019   Second degree burn of finger of right hand 08/25/2019   Sleep apnea    Therapeutic opioid induced constipation 06/22/2019   Last Assessment & Plan:  Formatting of this note might be different from the original. Continue amitiza 23mg b.i.d.   [place] 08/25/2019    Past Surgical History: Past Surgical History:  Procedure Laterality Date   APPENDECTOMY     CARDIOVERSION N/A 07/05/2020   Procedure: CARDIOVERSION;  Surgeon: OGeralynn Rile MD;  [facility]  Service: Cardiovascular;  Laterality: N/A;   CARDIOVERSION N/A 10/10/2021   Procedure: CARDIOVERSION;  Surgeon: CLelon Perla MD;  [facility];  Service: Cardiovascular;  Laterality: N/A;   CHOLECYSTECTOMY     INNER EAR SURGERY     KNEE ARTHROSCOPY     TEE WITHOUT CARDIOVERSION N/A 10/10/2021   Procedure: TRANSESOPHAGEAL ECHOCARDIOGRAM (TEE);  Surgeon: CLelon Perla MD;  [facility];  Service: Cardiovascular;  Laterality: N/A;    Current Medications: Current Meds  Medication Sig   betamethasone dipropionate 0.05 % cream Apply 1 application. topically daily as needed (itching).   Calcium Citrate-Vitamin D (CALCIUM CITRATE + D3 PO) Take 1 tablet by mouth at bedtime. 630 mg /12.5 mcg   celecoxib (CELEBREX) 200 MG capsule Take 200 mg by mouth 2 (two) times daily.   diclofenac Sodium (VOLTAREN) 1 % GEL Apply 1 g topically 4 (four) times daily as needed (pain).   ELIQUIS 5 MG TABS tablet TAKE 1 TABLET BY MOUTH TWICE A DAY   EPINEPHrine 0.3 mg/0.3 mL IJ SOAJ injection Inject into the  muscle.   Fexofenadine HCl (MUCINEX ALLERGY PO) Take by mouth daily as needed.   FIBER ADULT GUMMIES PO Take 10 g by mouth daily.   [place] 300 MG CAPS Take 300 mg by mouth in the morning and at bedtime.   HYDROcodone-acetaminophen (NORCO/VICODIN) 5-325 MG tablet Take 1 tablet by mouth every 12 (twelve) hours as needed for moderate pain.   Magnesium Citrate 200 MG TABS Take 200 mg by mouth daily. Gummies   mometasone (NASONEX) 50 MCG/ACT nasal spray Place 1 spray into the nose daily.   nebivolol (BYSTOLIC) 10 MG tablet Take 1 tablet by mouth twice daily   Oxcarbazepine (TRILEPTAL) 300 MG tablet Take 300 mg by mouth 2 (two) times daily.   OZEMPIC, 1 MG/DOSE, 4 MG/3ML SOPN Inject 1 mg into the muscle once a week. INJECT THE CONTENTS OF 1 PEN INJECTOR SUBQ ONCE WEEKLY   pregabalin (LYRICA) 150 MG capsule Take 150 mg by mouth 2 (two) times daily.   RELISTOR 150 MG  TABS Take 3 tablets by mouth daily.   rosuvastatin (CRESTOR) 20 MG tablet Take 1 tablet (20 mg total) by mouth daily. (Patient taking differently: Take 20 mg by mouth in the morning.)   testosterone cypionate (DEPOTESTOSTERONE CYPIONATE) 200 MG/ML injection Inject 200 mg into the muscle every 21 ( twenty-one) days.   tiZANidine (ZANAFLEX) 4 MG tablet Take 4 mg by mouth every 6 (six) hours as needed for muscle spasms.   valsartan (DIOVAN) 80 MG tablet Take 1 tablet (80 mg total) by mouth See admin instructions. Take 80 mg in the morning and 160 mg at bedtime (Patient taking differently: Take 80 mg by mouth 2 (two) times daily.)   [DISCONTINUED] dronedarone (MULTAQ) 400 MG tablet TAKE 1 TABLET BY MOUTH TWICE DAILY WITH MEALS     Allergies:    Augmentin [amoxicillin-pot clavulanate], Fish allergy, Iodinated contrast media, Ketorolac, Toradol [ketorolac tromethamine], Atorvastatin, Guaifenesin, Penicillins, Statins, Alendronate sodium, Dextromethorphan, Pine tar, and Pseudoephedrine   Social History: Social History   Socioeconomic  History   Marital status: Divorced    Spouse name: Not on file   Number of children: Not on file   Years of education: Not on file   Highest education level: Not on file  Occupational History   Occupation: retired  Tobacco Use   Smoking status: Never   Smokeless tobacco: Never  Vaping Use   Vaping Use: Never used  Substance and Sexual Activity   Alcohol use: No   Drug use: No   Sexual activity: Not on file  Other Topics Concern   Not on file  Social History Narrative   Not on file   Social Determinants of Health   Financial Resource Strain: Not on file  Food Insecurity: Not on file  Transportation Needs: Not on file  Physical Activity: Not on file  Stress: Not on file  Social Connections: Not on file     Family History: The patient's family history includes Macular degeneration in his mother; Thyroid disease in his mother.  ROS:   All other ROS reviewed and negative. Pertinent positives noted in the HPI.     EKGs/Labs/Other Studies Reviewed:   The following studies were personally reviewed by me today:  EKG:  EKG is ordered today.  The ekg ordered today demonstrates atrial fibrillation heart rate 75, no acute ischemic changes or evidence of infarction, and was personally reviewed by me.   TEE 10/10/2021  1. Left ventricular ejection fraction, by estimation, is 55 to 60%. The  left ventricle has normal function. The left ventricle has no regional  wall motion abnormalities.   2. Right ventricular systolic function is normal. The right ventricular  size is mildly enlarged.   3. Left atrial size was mildly dilated. No left atrial/left atrial  appendage thrombus was detected.   4. Right atrial size was mildly dilated.   5. The mitral valve is normal in structure. Trivial mitral valve  regurgitation.   6. The aortic valve is normal in structure. Aortic valve regurgitation is  not visualized.   Recent Labs: 08/28/2021: Hemoglobin 17.0; Magnesium 2.2; Platelets 190;  TSH 1.010 03/11/2022: BUN 25; Creatinine, Ser 1.27; Potassium 5.3; Sodium 139   Recent Lipid Panel No results found for: "CHOL", "TRIG", "HDL", "CHOLHDL", "VLDL", "LDLCALC", "LDLDIRECT"  Physical Exam:   VS:  BP 106/76   Pulse 71   Ht '6\' 4"'$  (1.93 m)   Wt (!) 316 lb 6.4 oz (143.5 kg)   SpO2 94%   BMI 38.51 kg/m  Wt Readings from Last 3 Encounters:  04/16/22 (!) 316 lb 6.4 oz (143.5 kg)  03/11/22 (!) 318 lb (144.2 kg)  10/26/21 (!) 322 lb 6.4 oz (146.2 kg)    General: Well nourished, well developed, in no acute distress Head: Atraumatic, normal size  Eyes: PEERLA, EOMI  Neck: Supple, no JVD Endocrine: No thryomegaly Cardiac: Normal S1, S2; RRR; no murmurs, rubs, or gallops Lungs: Clear to auscultation bilaterally, no wheezing, rhonchi or rales  Abd: Soft, nontender, no hepatomegaly  Ext: No edema, pulses 2+ Musculoskeletal: No deformities, BUE and BLE strength normal and equal Skin: Warm and dry, no rashes   Neuro: Alert and oriented to person, place, time, and situation, CNII-XII grossly intact, no focal deficits  Psych: Normal mood and affect   ASSESSMENT:   Peter Buck is a 66 y.o. male who presents for the following: 1. Paroxysmal atrial fibrillation (HCC)   2. Acquired thrombophilia ([place])   3. Mixed hyperlipidemia     PLAN:   1. Paroxysmal atrial fibrillation (HCC) 2. Acquired thrombophilia ([place]) -He has failed 2 cardioversions.  He has now failed Multaq therapy.  Remains in A-fib.  No symptoms.  Echo was normal.  Stress test really unremarkable.  His weight is over 300 pounds.  He is not a candidate for ablation.  Since he is having no symptoms would recommend rate control strategy.  We will continue his nebivolol.  He is on Eliquis 5 mg twice daily.  No bleeding issues.  3. Mixed hyperlipidemia -He is diabetic.  He is on a statin.  LDL cholesterol is at goal.      Disposition: Return in about 6 months (around 10/15/2022).  Medication Adjustments/Labs and  Tests Ordered: Current medicines are reviewed at length with the patient today.  Concerns regarding medicines are outlined above.  Orders Placed This Encounter  Procedures   EKG 12-Lead   No orders of the defined types were placed in this encounter.   Patient Instructions  Medication Instructions:  STOP Dronedarone   *If you need a refill on your cardiac medications before your next appointment, please call your pharmacy*   Follow-Up: At [facility], you and your health needs are our priority.  As part of our continuing mission to provide you with exceptional heart care, we have created designated Provider Care Teams.  These Care Teams include your primary Cardiologist (physician) and Advanced Practice Providers (APPs -  Physician Assistants and Nurse Practitioners) who all work together to provide you with the care you need, when you need it.  We recommend signing up for the patient portal called "MyChart".  Sign up information is provided on this After Visit Summary.  MyChart is used to connect with patients for Virtual Visits (Telemedicine).  Patients are able to view lab/test results, encounter notes, upcoming appointments, etc.  Non-urgent messages can be sent to your provider as well.   To learn more about what you can do with MyChart, go to [email or web address].    Your next appointment:   6 month(s)  The format for your next appointment:   In Person  Provider:   Sande Rives, PA-C and back to see Eleonore Chiquito, MD in 12 months.           Time Spent with Patient: I have spent a total of 25 minutes with patient reviewing hospital notes, telemetry, EKGs, labs and examining the patient as well as establishing an assessment and plan that was discussed with the patient.  > 50% of time  was spent in direct patient care.  Signed, Addison Naegeli. [name], MD, [place]  [address] [place], [place] [address] [phone]   04/16/2022 2:37 PM

## 2022-04-16 ENCOUNTER — Encounter: Payer: Self-pay | Admitting: Cardiovascular Disease

## 2022-04-16 ENCOUNTER — Ambulatory Visit: Payer: Medicare HMO | Attending: Cardiovascular Disease | Admitting: Cardiovascular Disease

## 2022-04-16 VITALS — BP 106/76 | HR 71 | Ht 76.0 in | Wt 316.4 lb

## 2022-04-16 DIAGNOSIS — I480 Paroxysmal atrial fibrillation: Secondary | ICD-10-CM | POA: Diagnosis not present

## 2022-04-16 DIAGNOSIS — D6869 Other thrombophilia: Secondary | ICD-10-CM

## 2022-04-16 DIAGNOSIS — E782 Mixed hyperlipidemia: Secondary | ICD-10-CM

## 2022-04-16 NOTE — Patient Instructions (Addendum)
Medication Instructions:  STOP Dronedarone   *If you need a refill on your cardiac medications before your next appointment, please call your pharmacy*   Follow-Up: At [facility], you and your health needs are our priority.  As part of our continuing mission to provide you with exceptional heart care, we have created designated Provider Care Teams.  These Care Teams include your primary Cardiologist (physician) and Advanced Practice Providers (APPs -  Physician Assistants and Nurse Practitioners) who all work together to provide you with the care you need, when you need it.  We recommend signing up for the patient portal called "MyChart".  Sign up information is provided on this After Visit Summary.  MyChart is used to connect with patients for Virtual Visits (Telemedicine).  Patients are able to view lab/test results, encounter notes, upcoming appointments, etc.  Non-urgent messages can be sent to your provider as well.   To learn more about what you can do with MyChart, go to [email or web address].    Your next appointment:   6 month(s)  The format for your next appointment:   In Person  Provider:   Sande Rives, PA-C and back to see Eleonore Chiquito, MD in 12 months.

## 2022-04-17 DIAGNOSIS — J45909 Unspecified asthma, uncomplicated: Secondary | ICD-10-CM | POA: Diagnosis not present

## 2022-04-17 DIAGNOSIS — E1159 Type 2 diabetes mellitus with other circulatory complications: Secondary | ICD-10-CM | POA: Diagnosis not present

## 2022-04-23 DIAGNOSIS — H1013 Acute atopic conjunctivitis, bilateral: Secondary | ICD-10-CM | POA: Diagnosis not present

## 2022-04-23 DIAGNOSIS — Z6838 Body mass index (BMI) 38.0-38.9, adult: Secondary | ICD-10-CM | POA: Diagnosis not present

## 2022-04-29 DIAGNOSIS — E291 Testicular hypofunction: Secondary | ICD-10-CM | POA: Diagnosis not present

## 2022-04-30 DIAGNOSIS — M8580 Other specified disorders of bone density and structure, unspecified site: Secondary | ICD-10-CM | POA: Diagnosis not present

## 2022-04-30 DIAGNOSIS — R768 Other specified abnormal immunological findings in serum: Secondary | ICD-10-CM | POA: Diagnosis not present

## 2022-04-30 DIAGNOSIS — M25561 Pain in right knee: Secondary | ICD-10-CM | POA: Diagnosis not present

## 2022-04-30 DIAGNOSIS — M1990 Unspecified osteoarthritis, unspecified site: Secondary | ICD-10-CM | POA: Diagnosis not present

## 2022-04-30 DIAGNOSIS — M19049 Primary osteoarthritis, unspecified hand: Secondary | ICD-10-CM | POA: Diagnosis not present

## 2022-05-06 DIAGNOSIS — G4733 Obstructive sleep apnea (adult) (pediatric): Secondary | ICD-10-CM | POA: Diagnosis not present

## 2022-05-06 DIAGNOSIS — Z789 Other specified health status: Secondary | ICD-10-CM | POA: Diagnosis not present

## 2022-05-06 DIAGNOSIS — Z6841 Body Mass Index (BMI) 40.0 and over, adult: Secondary | ICD-10-CM | POA: Diagnosis not present

## 2022-05-13 ENCOUNTER — Ambulatory Visit: Payer: Medicare HMO | Admitting: Podiatry

## 2022-05-14 ENCOUNTER — Ambulatory Visit: Payer: Medicare HMO | Admitting: Podiatry

## 2022-05-14 DIAGNOSIS — M79674 Pain in right toe(s): Secondary | ICD-10-CM | POA: Diagnosis not present

## 2022-05-14 DIAGNOSIS — B351 Tinea unguium: Secondary | ICD-10-CM | POA: Diagnosis not present

## 2022-05-14 DIAGNOSIS — M79675 Pain in left toe(s): Secondary | ICD-10-CM | POA: Diagnosis not present

## 2022-05-14 DIAGNOSIS — I739 Peripheral vascular disease, unspecified: Secondary | ICD-10-CM

## 2022-05-14 DIAGNOSIS — E088 Diabetes mellitus due to underlying condition with unspecified complications: Secondary | ICD-10-CM

## 2022-05-14 NOTE — Progress Notes (Signed)
  Subjective:  Patient ID: Dayquan Buys, male    DOB: [birth year],  MRN: [id number]  Chief Complaint  Patient presents with   Nail Problem    [facility]     66 y.o. male presents with the above complaint. History confirmed with patient. Patient presenting with pain related to dystrophic thickened elongated nails. Patient is unable to trim own nails related to nail dystrophy and/or mobility issues. Patient does have a history of T2DM.   Objective:  Physical Exam: warm, good capillary refill nail exam onychomycosis of the toenails, onycholysis, and dystrophic nails DP pulses palpable, PT pulses palpable, and protective sensation absent Left Foot:  Pain with palpation of nails due to elongation and dystrophic growth.  Right Foot: Pain with palpation of nails due to elongation and dystrophic growth.   Assessment:   1. Pain due to onychomycosis of toenails of both feet   2. PVD (peripheral vascular disease) ([place])   3. Diabetes mellitus due to underlying condition with unspecified complications [facility])      Plan:  Patient was evaluated and treated and all questions answered.  #Onychomycosis with pain  -Nails palliatively debrided as below. -Educated on self-care  Procedure: Nail Debridement Rationale: Pain Type of Debridement: manual, sharp debridement. Instrumentation: Nail nipper, rotary burr. Number of Nails: 10  No follow-ups on file.         Everitt Amber, DPM [facility]

## 2022-05-17 DIAGNOSIS — E1159 Type 2 diabetes mellitus with other circulatory complications: Secondary | ICD-10-CM | POA: Diagnosis not present

## 2022-05-17 DIAGNOSIS — J45909 Unspecified asthma, uncomplicated: Secondary | ICD-10-CM | POA: Diagnosis not present

## 2022-05-21 DIAGNOSIS — M25551 Pain in right hip: Secondary | ICD-10-CM | POA: Diagnosis not present

## 2022-05-21 DIAGNOSIS — R2689 Other abnormalities of gait and mobility: Secondary | ICD-10-CM | POA: Diagnosis not present

## 2022-05-21 DIAGNOSIS — M5489 Other dorsalgia: Secondary | ICD-10-CM | POA: Diagnosis not present

## 2022-05-21 DIAGNOSIS — M25561 Pain in right knee: Secondary | ICD-10-CM | POA: Diagnosis not present

## 2022-05-21 DIAGNOSIS — M6281 Muscle weakness (generalized): Secondary | ICD-10-CM | POA: Diagnosis not present

## 2022-05-22 DIAGNOSIS — E1142 Type 2 diabetes mellitus with diabetic polyneuropathy: Secondary | ICD-10-CM | POA: Diagnosis not present

## 2022-05-22 DIAGNOSIS — M62830 Muscle spasm of back: Secondary | ICD-10-CM | POA: Diagnosis not present

## 2022-05-22 DIAGNOSIS — M5412 Radiculopathy, cervical region: Secondary | ICD-10-CM | POA: Diagnosis not present

## 2022-05-22 DIAGNOSIS — T402X5A Adverse effect of other opioids, initial encounter: Secondary | ICD-10-CM | POA: Diagnosis not present

## 2022-05-22 DIAGNOSIS — R69 Illness, unspecified: Secondary | ICD-10-CM | POA: Diagnosis not present

## 2022-05-22 DIAGNOSIS — G8929 Other chronic pain: Secondary | ICD-10-CM | POA: Diagnosis not present

## 2022-05-22 DIAGNOSIS — M25561 Pain in right knee: Secondary | ICD-10-CM | POA: Diagnosis not present

## 2022-05-22 DIAGNOSIS — M5136 Other intervertebral disc degeneration, lumbar region: Secondary | ICD-10-CM | POA: Diagnosis not present

## 2022-05-22 DIAGNOSIS — K5903 Drug induced constipation: Secondary | ICD-10-CM | POA: Diagnosis not present

## 2022-05-22 DIAGNOSIS — Z6839 Body mass index (BMI) 39.0-39.9, adult: Secondary | ICD-10-CM | POA: Diagnosis not present

## 2022-05-28 DIAGNOSIS — M25561 Pain in right knee: Secondary | ICD-10-CM | POA: Diagnosis not present

## 2022-05-28 DIAGNOSIS — M5489 Other dorsalgia: Secondary | ICD-10-CM | POA: Diagnosis not present

## 2022-05-28 DIAGNOSIS — M25551 Pain in right hip: Secondary | ICD-10-CM | POA: Diagnosis not present

## 2022-05-28 DIAGNOSIS — R2689 Other abnormalities of gait and mobility: Secondary | ICD-10-CM | POA: Diagnosis not present

## 2022-05-28 DIAGNOSIS — M6281 Muscle weakness (generalized): Secondary | ICD-10-CM | POA: Diagnosis not present

## 2022-05-29 DIAGNOSIS — E291 Testicular hypofunction: Secondary | ICD-10-CM | POA: Diagnosis not present

## 2022-05-31 DIAGNOSIS — M6281 Muscle weakness (generalized): Secondary | ICD-10-CM | POA: Diagnosis not present

## 2022-05-31 DIAGNOSIS — R2689 Other abnormalities of gait and mobility: Secondary | ICD-10-CM | POA: Diagnosis not present

## 2022-05-31 DIAGNOSIS — M25551 Pain in right hip: Secondary | ICD-10-CM | POA: Diagnosis not present

## 2022-05-31 DIAGNOSIS — M5489 Other dorsalgia: Secondary | ICD-10-CM | POA: Diagnosis not present

## 2022-05-31 DIAGNOSIS — M25561 Pain in right knee: Secondary | ICD-10-CM | POA: Diagnosis not present

## 2022-06-03 DIAGNOSIS — R159 Full incontinence of feces: Secondary | ICD-10-CM | POA: Diagnosis not present

## 2022-06-03 DIAGNOSIS — Z6838 Body mass index (BMI) 38.0-38.9, adult: Secondary | ICD-10-CM | POA: Diagnosis not present

## 2022-06-06 DIAGNOSIS — R159 Full incontinence of feces: Secondary | ICD-10-CM | POA: Diagnosis not present

## 2022-06-17 DIAGNOSIS — E1159 Type 2 diabetes mellitus with other circulatory complications: Secondary | ICD-10-CM | POA: Diagnosis not present

## 2022-06-17 DIAGNOSIS — J45909 Unspecified asthma, uncomplicated: Secondary | ICD-10-CM | POA: Diagnosis not present

## 2022-06-25 DIAGNOSIS — F603 Borderline personality disorder: Secondary | ICD-10-CM | POA: Diagnosis not present

## 2022-06-25 DIAGNOSIS — R69 Illness, unspecified: Secondary | ICD-10-CM | POA: Diagnosis not present

## 2022-07-01 DIAGNOSIS — E291 Testicular hypofunction: Secondary | ICD-10-CM | POA: Diagnosis not present

## 2022-07-03 ENCOUNTER — Other Ambulatory Visit: Payer: Self-pay | Admitting: Cardiovascular Disease

## 2022-07-17 DIAGNOSIS — Q984 Klinefelter syndrome, unspecified: Secondary | ICD-10-CM | POA: Diagnosis not present

## 2022-07-17 DIAGNOSIS — Z79899 Other long term (current) drug therapy: Secondary | ICD-10-CM | POA: Diagnosis not present

## 2022-07-17 DIAGNOSIS — E291 Testicular hypofunction: Secondary | ICD-10-CM | POA: Diagnosis not present

## 2022-07-17 DIAGNOSIS — N401 Enlarged prostate with lower urinary tract symptoms: Secondary | ICD-10-CM | POA: Diagnosis not present

## 2022-07-17 DIAGNOSIS — E349 Endocrine disorder, unspecified: Secondary | ICD-10-CM | POA: Diagnosis not present

## 2022-07-18 DIAGNOSIS — J45909 Unspecified asthma, uncomplicated: Secondary | ICD-10-CM | POA: Diagnosis not present

## 2022-07-18 DIAGNOSIS — E1159 Type 2 diabetes mellitus with other circulatory complications: Secondary | ICD-10-CM | POA: Diagnosis not present

## 2022-07-26 DIAGNOSIS — R059 Cough, unspecified: Secondary | ICD-10-CM | POA: Diagnosis not present

## 2022-07-26 DIAGNOSIS — Z20822 Contact with and (suspected) exposure to covid-19: Secondary | ICD-10-CM | POA: Diagnosis not present

## 2022-07-26 DIAGNOSIS — Z6838 Body mass index (BMI) 38.0-38.9, adult: Secondary | ICD-10-CM | POA: Diagnosis not present

## 2022-07-26 DIAGNOSIS — U071 COVID-19: Secondary | ICD-10-CM | POA: Diagnosis not present

## 2022-07-30 DIAGNOSIS — Z961 Presence of intraocular lens: Secondary | ICD-10-CM | POA: Insufficient documentation

## 2022-08-02 DIAGNOSIS — R062 Wheezing: Secondary | ICD-10-CM | POA: Diagnosis not present

## 2022-08-02 DIAGNOSIS — R5382 Chronic fatigue, unspecified: Secondary | ICD-10-CM | POA: Diagnosis not present

## 2022-08-02 DIAGNOSIS — R051 Acute cough: Secondary | ICD-10-CM | POA: Diagnosis not present

## 2022-08-02 DIAGNOSIS — M5134 Other intervertebral disc degeneration, thoracic region: Secondary | ICD-10-CM | POA: Diagnosis not present

## 2022-08-02 DIAGNOSIS — R0600 Dyspnea, unspecified: Secondary | ICD-10-CM | POA: Diagnosis not present

## 2022-08-02 DIAGNOSIS — U071 COVID-19: Secondary | ICD-10-CM | POA: Diagnosis not present

## 2022-08-02 DIAGNOSIS — J209 Acute bronchitis, unspecified: Secondary | ICD-10-CM | POA: Diagnosis not present

## 2022-08-02 DIAGNOSIS — J3489 Other specified disorders of nose and nasal sinuses: Secondary | ICD-10-CM | POA: Diagnosis not present

## 2022-08-16 DIAGNOSIS — E1159 Type 2 diabetes mellitus with other circulatory complications: Secondary | ICD-10-CM | POA: Diagnosis not present

## 2022-08-16 DIAGNOSIS — J45909 Unspecified asthma, uncomplicated: Secondary | ICD-10-CM | POA: Diagnosis not present

## 2022-08-20 DIAGNOSIS — T402X5A Adverse effect of other opioids, initial encounter: Secondary | ICD-10-CM | POA: Diagnosis not present

## 2022-08-20 DIAGNOSIS — M5136 Other intervertebral disc degeneration, lumbar region: Secondary | ICD-10-CM | POA: Diagnosis not present

## 2022-08-20 DIAGNOSIS — M5412 Radiculopathy, cervical region: Secondary | ICD-10-CM | POA: Diagnosis not present

## 2022-08-20 DIAGNOSIS — G8929 Other chronic pain: Secondary | ICD-10-CM | POA: Diagnosis not present

## 2022-08-20 DIAGNOSIS — K5903 Drug induced constipation: Secondary | ICD-10-CM | POA: Diagnosis not present

## 2022-08-20 DIAGNOSIS — M62830 Muscle spasm of back: Secondary | ICD-10-CM | POA: Diagnosis not present

## 2022-08-20 DIAGNOSIS — Z5181 Encounter for therapeutic drug level monitoring: Secondary | ICD-10-CM | POA: Diagnosis not present

## 2022-08-20 DIAGNOSIS — E1142 Type 2 diabetes mellitus with diabetic polyneuropathy: Secondary | ICD-10-CM | POA: Diagnosis not present

## 2022-08-20 DIAGNOSIS — M25561 Pain in right knee: Secondary | ICD-10-CM | POA: Diagnosis not present

## 2022-08-20 DIAGNOSIS — R69 Illness, unspecified: Secondary | ICD-10-CM | POA: Diagnosis not present

## 2022-08-21 DIAGNOSIS — E1140 Type 2 diabetes mellitus with diabetic neuropathy, unspecified: Secondary | ICD-10-CM | POA: Diagnosis not present

## 2022-08-21 DIAGNOSIS — I10 Essential (primary) hypertension: Secondary | ICD-10-CM | POA: Diagnosis not present

## 2022-08-21 DIAGNOSIS — Z125 Encounter for screening for malignant neoplasm of prostate: Secondary | ICD-10-CM | POA: Diagnosis not present

## 2022-08-21 DIAGNOSIS — E782 Mixed hyperlipidemia: Secondary | ICD-10-CM | POA: Diagnosis not present

## 2022-08-23 DIAGNOSIS — E782 Mixed hyperlipidemia: Secondary | ICD-10-CM | POA: Diagnosis not present

## 2022-08-23 DIAGNOSIS — R809 Proteinuria, unspecified: Secondary | ICD-10-CM | POA: Diagnosis not present

## 2022-08-23 DIAGNOSIS — Z6841 Body Mass Index (BMI) 40.0 and over, adult: Secondary | ICD-10-CM | POA: Diagnosis not present

## 2022-08-23 DIAGNOSIS — I10 Essential (primary) hypertension: Secondary | ICD-10-CM | POA: Diagnosis not present

## 2022-08-23 DIAGNOSIS — E1129 Type 2 diabetes mellitus with other diabetic kidney complication: Secondary | ICD-10-CM | POA: Diagnosis not present

## 2022-08-30 ENCOUNTER — Telehealth: Payer: Self-pay

## 2022-08-30 NOTE — Progress Notes (Signed)
[facility]) [place]   08/30/2022  English Coit Mar 16, 1956 [id number]  Reason for referral: Medication Assistance with Ozempic   Referral source: [facility] RN Current insurance: [employer] [place]  Reason for call: Medication assistance for [facility]:  Unsuccessful telephone call attempt #1 to patient.   Unable to leave message - I was able to briefly speak with the patient and he stated it was not a good time to talk as he was at work and didn't have his medication. He asked that I call back early next week.   Plan:  -I will make another outreach attempt to patient within 3-4 business days.  Thank you for allowing [facility] pharmacy to be a part of this patient's care.  Kristeen Miss, PharmD Clinical Pharmacist [place] Cell: [phone]

## 2022-09-02 ENCOUNTER — Other Ambulatory Visit: Payer: Self-pay | Admitting: Cardiovascular Disease

## 2022-09-04 DIAGNOSIS — Z87892 Personal history of anaphylaxis: Secondary | ICD-10-CM | POA: Diagnosis not present

## 2022-09-04 DIAGNOSIS — Z6841 Body Mass Index (BMI) 40.0 and over, adult: Secondary | ICD-10-CM | POA: Diagnosis not present

## 2022-09-04 DIAGNOSIS — R438 Other disturbances of smell and taste: Secondary | ICD-10-CM | POA: Diagnosis not present

## 2022-09-05 ENCOUNTER — Telehealth: Payer: Self-pay | Admitting: Pharmacist

## 2022-09-05 NOTE — Progress Notes (Signed)
[facility]) Care Management  [place]   09/05/2022  Peter Buck 1955/11/06 [id number]  Reason for referral: Medication assistance  Referral source: Practice/Provider Referral medication(s): ozempic Current insurance:[employer]  Allergies  Allergen Reactions   Augmentin [Amoxicillin-Pot Clavulanate] Anaphylaxis   Fish Allergy Anaphylaxis    Specifically [place]   Iodinated Contrast Media Anaphylaxis, Hives, Other (See Comments) and Rash    Severe hypotension Developed reaction a day and a half after a lumbar nerve root block on 09/22/17.  Treated by dermatologist with prednisone taper and antihistamines. Developed reaction a day and a half after a lumbar nerve root block on 09/22/17.  Treated by dermatologist with prednisone taper and antihistamines. Developed reaction a day and a half after a lumbar nerve root block on 09/22/17.  Treated by dermatologist with prednisone taper and antihistamines.    Ketorolac Anaphylaxis   Toradol [Ketorolac Tromethamine] Anaphylaxis    "It put me in shock.  My blood pressure tanked and I was itchy all over; had to go to the ER."   Atorvastatin Other (See Comments)    Severe back ache   Guaifenesin Swelling and Other (See Comments)    "It sorta made my throat close up."    Penicillins Dermatitis   Statins Other (See Comments)    Severe back ache   Alendronate Sodium Other (See Comments)    Unknown reaction    Dextromethorphan Other (See Comments)    Unknown reaction   Pine Tar Other (See Comments)    Arms get puffy and itching   Pseudoephedrine Other (See Comments)    "It dries me out.  I take it when my other allergy medicine isn't working."       Medication Assistance Findings:  Medication assistance needs identified: Patient called me back. He reported the Ozempic would be $770 next month because he is now over income for LIS as his pension has started.  From the Patient's reported income, he is very close to the  income cut off.  As such, an Extra Help, LIS application was completed for him online during our phone call.  After reviewing the patient's medications, There are a couple of other medications he may be able to access through patient assistance programs as well:  Eliquis and Amitiza.  Eliquis program requires patients to spend at least 3% of their annual income to be eligible.  Since the patient was previously on  LIS/Extra Help but needed to redo his application due to now receiving a pension, he has not spent enough this year to qualify.  He will be sent applications for [employer]  ([place]) and [employer] (Amitiza).    Additional medication assistance options reviewed with patient as warranted:  No other options identified  Plan: I will route patient assistance letter to [place] technician who will coordinate patient assistance program application process for medications listed above.  [facility] pharmacy technician will assist with obtaining all required documents from both patient and provider(s) and submit application(s) once completed.    Elayne Guerin, PharmD, [place] Clinical Pharmacist [phone]

## 2022-09-05 NOTE — Progress Notes (Signed)
[facility])                                            [place] Team    09/05/2022  Peter Buck January 16, 1956 [id number]  Called patient regarding medication assistance. Unfortunately, he did not answer the phone. HIPAA compliant message was let on his voicemail.  Today's call was the second call regarding medication assistance.  Plan: Call patient back in 3-5 business days.  Elayne Guerin, PharmD, [place] Clinical Pharmacist [phone]

## 2022-09-06 NOTE — Telephone Encounter (Signed)
Prescription refill request for Eliquis received.  Indication: afib  Last office visit: [place], 04/16/2022 Scr: 1.27, 03/11/2022 Age: 67 yo  Weight: 143 kg    Pt is on the correct dose of Eliquis.

## 2022-09-11 DIAGNOSIS — D485 Neoplasm of uncertain behavior of skin: Secondary | ICD-10-CM | POA: Diagnosis not present

## 2022-09-11 DIAGNOSIS — L821 Other seborrheic keratosis: Secondary | ICD-10-CM | POA: Diagnosis not present

## 2022-09-11 DIAGNOSIS — D2239 Melanocytic nevi of other parts of face: Secondary | ICD-10-CM | POA: Diagnosis not present

## 2022-09-12 ENCOUNTER — Telehealth: Payer: Self-pay | Admitting: Pharmacy Technician

## 2022-09-12 DIAGNOSIS — Z596 Low income: Secondary | ICD-10-CM

## 2022-09-12 NOTE — Progress Notes (Signed)
[facility])                                            [place] Team    09/12/2022  Paras Tellman [birth year] [id number]                                       Medication Assistance Referral  Referral From: [place]  Medication/Company: [employer] / [employer] Patient application portion:  Education officer, museum portion: Faxed  to [employer] [facility] Provider address/fax verified via: Office website  Medication/Company: [employer] Patient application portion:  Education officer, museum portion: Faxed  to Dr. Hiram Comber Provider address/fax verified via: Office website   Ziomara Birenbaum P. Giovannina Mun, [place]  [phone]

## 2022-09-16 ENCOUNTER — Encounter: Payer: Self-pay | Admitting: Cardiovascular Disease

## 2022-09-16 DIAGNOSIS — E1129 Type 2 diabetes mellitus with other diabetic kidney complication: Secondary | ICD-10-CM | POA: Diagnosis not present

## 2022-09-16 DIAGNOSIS — I10 Essential (primary) hypertension: Secondary | ICD-10-CM | POA: Diagnosis not present

## 2022-09-16 DIAGNOSIS — E782 Mixed hyperlipidemia: Secondary | ICD-10-CM | POA: Diagnosis not present

## 2022-09-17 MED ORDER — ROSUVASTATIN CALCIUM 40 MG PO TABS: 40.0000 mg | ORAL_TABLET | Freq: Every day | ORAL | 1 refills | Status: DC

## 2022-09-18 NOTE — Addendum Note (Signed)
Addended by: Caprice Beaver T on: 09/18/2022 07:51 AM   Modules accepted: Orders

## 2022-09-24 DIAGNOSIS — F603 Borderline personality disorder: Secondary | ICD-10-CM | POA: Diagnosis not present

## 2022-09-30 DIAGNOSIS — E349 Endocrine disorder, unspecified: Secondary | ICD-10-CM | POA: Diagnosis not present

## 2022-09-30 DIAGNOSIS — Q984 Klinefelter syndrome, unspecified: Secondary | ICD-10-CM | POA: Diagnosis not present

## 2022-09-30 DIAGNOSIS — Z79899 Other long term (current) drug therapy: Secondary | ICD-10-CM | POA: Diagnosis not present

## 2022-10-02 DIAGNOSIS — H02831 Dermatochalasis of right upper eyelid: Secondary | ICD-10-CM | POA: Insufficient documentation

## 2022-10-02 DIAGNOSIS — Z961 Presence of intraocular lens: Secondary | ICD-10-CM | POA: Diagnosis not present

## 2022-10-02 DIAGNOSIS — H02834 Dermatochalasis of left upper eyelid: Secondary | ICD-10-CM | POA: Diagnosis not present

## 2022-10-02 DIAGNOSIS — E119 Type 2 diabetes mellitus without complications: Secondary | ICD-10-CM | POA: Diagnosis not present

## 2022-10-03 DIAGNOSIS — M8580 Other specified disorders of bone density and structure, unspecified site: Secondary | ICD-10-CM | POA: Diagnosis not present

## 2022-10-03 DIAGNOSIS — M25561 Pain in right knee: Secondary | ICD-10-CM | POA: Diagnosis not present

## 2022-10-03 DIAGNOSIS — R768 Other specified abnormal immunological findings in serum: Secondary | ICD-10-CM | POA: Diagnosis not present

## 2022-10-03 DIAGNOSIS — M79672 Pain in left foot: Secondary | ICD-10-CM | POA: Diagnosis not present

## 2022-10-03 DIAGNOSIS — M1990 Unspecified osteoarthritis, unspecified site: Secondary | ICD-10-CM | POA: Diagnosis not present

## 2022-10-03 DIAGNOSIS — M19049 Primary osteoarthritis, unspecified hand: Secondary | ICD-10-CM | POA: Diagnosis not present

## 2022-10-03 DIAGNOSIS — E11610 Type 2 diabetes mellitus with diabetic neuropathic arthropathy: Secondary | ICD-10-CM | POA: Diagnosis not present

## 2022-10-03 DIAGNOSIS — Q6650 Congenital pes planus, unspecified foot: Secondary | ICD-10-CM | POA: Diagnosis not present

## 2022-10-03 DIAGNOSIS — M19072 Primary osteoarthritis, left ankle and foot: Secondary | ICD-10-CM | POA: Diagnosis not present

## 2022-10-03 DIAGNOSIS — Q828 Other specified congenital malformations of skin: Secondary | ICD-10-CM | POA: Diagnosis not present

## 2022-10-08 DIAGNOSIS — S92352A Displaced fracture of fifth metatarsal bone, left foot, initial encounter for closed fracture: Secondary | ICD-10-CM | POA: Diagnosis not present

## 2022-10-08 DIAGNOSIS — S92355A Nondisplaced fracture of fifth metatarsal bone, left foot, initial encounter for closed fracture: Secondary | ICD-10-CM | POA: Diagnosis not present

## 2022-10-08 DIAGNOSIS — W010XXA Fall on same level from slipping, tripping and stumbling without subsequent striking against object, initial encounter: Secondary | ICD-10-CM | POA: Diagnosis not present

## 2022-10-08 DIAGNOSIS — M79672 Pain in left foot: Secondary | ICD-10-CM | POA: Diagnosis not present

## 2022-10-08 DIAGNOSIS — S92342A Displaced fracture of fourth metatarsal bone, left foot, initial encounter for closed fracture: Secondary | ICD-10-CM | POA: Diagnosis not present

## 2022-10-08 DIAGNOSIS — S92902A Unspecified fracture of left foot, initial encounter for closed fracture: Secondary | ICD-10-CM | POA: Diagnosis not present

## 2022-10-09 DIAGNOSIS — Z7689 Persons encountering health services in other specified circumstances: Secondary | ICD-10-CM | POA: Diagnosis not present

## 2022-10-09 DIAGNOSIS — I872 Venous insufficiency (chronic) (peripheral): Secondary | ICD-10-CM | POA: Diagnosis not present

## 2022-10-09 DIAGNOSIS — Z6841 Body Mass Index (BMI) 40.0 and over, adult: Secondary | ICD-10-CM | POA: Diagnosis not present

## 2022-10-09 DIAGNOSIS — S92309A Fracture of unspecified metatarsal bone(s), unspecified foot, initial encounter for closed fracture: Secondary | ICD-10-CM | POA: Diagnosis not present

## 2022-10-11 ENCOUNTER — Telehealth: Payer: Self-pay

## 2022-10-11 DIAGNOSIS — S92302A Fracture of unspecified metatarsal bone(s), left foot, initial encounter for closed fracture: Secondary | ICD-10-CM | POA: Diagnosis not present

## 2022-10-11 NOTE — Telephone Encounter (Signed)
     Patient  visit on 10/08/2022  at [facility] was for foot injury.  Have you been able to follow up with your primary care physician? Patient followed up with Orthopedic Surgeon.  The patient was or was not able to obtain any needed medicine or equipment. Patient did not want to take pain medication.  Are there diet recommendations that you are having difficulty following? No  Patient expresses understanding of discharge instructions and education provided has no other needs at this time. Yes   [facility]  [facility] Population Health Community Resource Care Guide   [email or web address]  ?? [phone]   Website: [email or web address]  [email or web address]

## 2022-10-16 DIAGNOSIS — E1129 Type 2 diabetes mellitus with other diabetic kidney complication: Secondary | ICD-10-CM | POA: Diagnosis not present

## 2022-10-16 DIAGNOSIS — I10 Essential (primary) hypertension: Secondary | ICD-10-CM | POA: Diagnosis not present

## 2022-10-16 DIAGNOSIS — E782 Mixed hyperlipidemia: Secondary | ICD-10-CM | POA: Diagnosis not present

## 2022-10-17 DIAGNOSIS — K5903 Drug induced constipation: Secondary | ICD-10-CM | POA: Diagnosis not present

## 2022-10-17 DIAGNOSIS — M5136 Other intervertebral disc degeneration, lumbar region: Secondary | ICD-10-CM | POA: Diagnosis not present

## 2022-10-17 DIAGNOSIS — E1142 Type 2 diabetes mellitus with diabetic polyneuropathy: Secondary | ICD-10-CM | POA: Diagnosis not present

## 2022-10-17 DIAGNOSIS — Z5181 Encounter for therapeutic drug level monitoring: Secondary | ICD-10-CM | POA: Diagnosis not present

## 2022-10-17 DIAGNOSIS — S92302D Fracture of unspecified metatarsal bone(s), left foot, subsequent encounter for fracture with routine healing: Secondary | ICD-10-CM | POA: Insufficient documentation

## 2022-10-17 DIAGNOSIS — F1190 Opioid use, unspecified, uncomplicated: Secondary | ICD-10-CM | POA: Diagnosis not present

## 2022-10-17 DIAGNOSIS — Z79891 Long term (current) use of opiate analgesic: Secondary | ICD-10-CM | POA: Diagnosis not present

## 2022-10-17 DIAGNOSIS — T402X5A Adverse effect of other opioids, initial encounter: Secondary | ICD-10-CM | POA: Diagnosis not present

## 2022-10-23 NOTE — Progress Notes (Signed)
Cardiology Office Note:    Date:  11/05/2022   ID:  Peter Buck, DOB 1955/07/06, MRN [id number]  PCP:  Charlott Rakes, MD  Cardiologist:  Reatha Harps, MD  Electrophysiologist:  None   Referring MD: Charlott Rakes, MD   Chief Complaint: follow-up of atrial fibrillation  History of Present Illness:    Peter Buck is a 67 y.o. male with a history of persistent atrial fibrillation on Eliquis, hypertension, hyperlipidemia, type 2 diabetes mellitus, obstructive sleep apnea on CPAP, chronic pain syndrome, bipolar disorder, and obesity who is followed by Dr. Flora Lipps and presents today for follow-up of atrial fibrillation.  Patient has a history of persistent atrial fibrillation. He has undergone multiple cardioversions in the past (last one 09/2021) and has failed Multaq therapy. Prior Echo in 10/2019 showed LVEF of 55-60%. Myoview in 10/2019 showed no evidence of ischemia. Patient was last seen by Dr. Flora Lipps in 03/2022 at which time he was back in atrial fibrillation. However, he denied any symptoms of atrial fibrillation. Rate control strategy was recommended given he was asymptomatic. He was not felt to be a candidate for ablation given morbid obesity with weight >300 lbs.   Patient presents today for follow-up.  Here alone.  Patient reports his weight is up about 20 pounds over the last 2 weeks.  He attributes this to eating a more salty diet and not being able to be as active due to breaking his left foot last month.  He has chronic lower extremity edema and does report worsening edema over the the last 2 weeks as well.  His podiatrist advised him not to wear compression stockings while his foot fracture is healing.  However, he denies any shortness of breath, orthopnea, or PND.  He does have obstructive sleep apnea and states he feels like he has been breathing shallow at night.  He recently saw his Neurologist who manages his CPAP and they adjust his settings.  He had an isolated  episode of chest discomfort yesterday that was clearly associated with hiccups.  He denies any other chest pain outside of the hiccups yesterday.  No exertional chest pain.  He denies any palpitations or awareness of his atrial fibrillation.  He does describe frequent lightheadedness/dizziness that usually occurs on Sunday mornings.  He sings in the choir at his church. It sounds like this may be due to dehydration as symptoms improve when he drinks water. I encouraged him to stay well hydrated.  He denies any syncope.  However, he does describe falling asleep very easily and reports falling asleep very briefly while driving.  He thankfully has not been in any accidents. He states this is not new and has been going on for few months.  This does not sound like syncope and patient states it is clearly correlated to when he does not sleep well at night.  Patient states he will pull over and take a nap for 3 hours when this happens.    Past Medical History:  Diagnosis Date   Aftercare following surgery 04/23/2018   Anemia    Arthritis    Arthritis, midfoot 12/05/2016   Asthma    Bipolar affective disorder, depressed (HCC) 02/13/2016   Bipolar disorder (HCC)    Body mass index (bmi) 38.0-38.9, adult 09/16/2017   Body mass index (BMI) 38.0-38.9, adult 09/16/2017   Formatting of this note might be different from the original. IMO 10/01 Updates   Borderline personality disorder (HCC) 04/29/2016   Cervical radiculopathy 02/02/2019  Last Assessment & Plan:  Formatting of this note might be different from the original. MRI of the cervical spine shows cervical spondylosis with facet arthrosis most notable for mild to moderate left neural foraminal stenosis at C3-4.  Per patient, nerve conduction studies showed right C7-8 radiculopathy.    Continue with current regimen as outlined in lumbar degenerative disc plan.   Chronic pain of left knee 10/19/2019   Chronic pain syndrome 02/02/2019   Comprehensive diabetic foot  examination, type 2 DM, encounter for (HCC) 12/25/2015   DDD (degenerative disc disease), lumbar 11/25/2017   Last Assessment & Plan:  Formatting of this note might be different from the original. 67 year old male with chronic, predominantly left-sided low back pain with radiation into the left hip and groin.  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 male with Low back pain, and acute on chronic low back pain with left hip, knee, and foot pain.  Today I will treat with the patient's current medication of tramadol and diclofenac and add relafen (temporarily d/c diclofenac) for 10 days and increase lyrica. He will continue with his current appt with his neurosurgery.  Mr. Igou will return to    Muscle spasm of back 02/17/2018   Last Assessment & Plan:  Formatting of this note might be different from the original. Continue with tizanidine 4 mg as needed for muscle spasming up to 3 times daily.   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 Review of prior UDS results are within normal limits.  [place] controlled substance registry reviewed and  there were no inconsistencies noted.   Paroxysmal atrial fibrillation (HCC) 12/23/2019   Plantar fasciitis 01/02/2017   Primary osteoarthritis of both first carpometacarpal joints 08/25/2019   Second degree burn of finger of right hand 08/25/2019   Sleep apnea    Therapeutic opioid induced constipation 06/22/2019   Last Assessment & Plan:  Formatting of this note might be different from the original. Continue amitiza b.i.d.   Verruca 08/25/2019    Past Surgical History:  Procedure Laterality Date   APPENDECTOMY     CARDIOVERSION N/A 07/05/2020   Procedure: CARDIOVERSION;  Surgeon: Sande Rives, MD;  Location: [facility] ENDOSCOPY;  Service: Cardiovascular;  Laterality: N/A;   CARDIOVERSION N/A 10/10/2021   Procedure: CARDIOVERSION;  Surgeon: Lewayne Bunting, MD;  Location: [facility] ENDOSCOPY;  Service: Cardiovascular;  Laterality: N/A;   CHOLECYSTECTOMY     INNER EAR SURGERY     KNEE ARTHROSCOPY     TEE WITHOUT CARDIOVERSION N/A 10/10/2021   Procedure: TRANSESOPHAGEAL ECHOCARDIOGRAM (TEE);  Surgeon: Lewayne Bunting, MD;  Location: [facility] ENDOSCOPY;  Service: Cardiovascular;  Laterality: N/A;    Current Medications: No outpatient medications have been marked as taking for the 11/05/22 encounter (Office Visit) with Corrin Parker, PA-C.     Allergies:   Augmentin [amoxicillin-pot clavulanate], Fish allergy, Iodinated contrast media, Ketorolac, Toradol [ketorolac tromethamine], Atorvastatin, Guaifenesin, Penicillins, Statins, Alendronate sodium, Dextromethorphan, Lovastatin, Pine tar, and Pseudoephedrine   Social History   Socioeconomic History   Marital status: Divorced    Spouse name: Not on file   Number of children: Not on file   Years of education: Not on file   Highest education level: Not on file  Occupational History   Occupation: retired  Tobacco Use   Smoking status: Never   Smokeless tobacco: Never  Vaping Use   Vaping Use: Never used  Substance and Sexual Activity    Alcohol use: No   Drug use: No   Sexual activity: Not on file  Other Topics Concern   Not on file  Social History Narrative   Not on file   Social Determinants of Health   Financial Resource Strain: Not on file  Food Insecurity: Not on file  Transportation Needs: Not on file  Physical Activity: Not on file  Stress: Not on file  Social Connections: Not on file     Family History: The patient's family history includes Macular degeneration in his mother; Thyroid disease in his mother.  ROS:   Please see the history of present illness.     EKGs/Labs/Other Studies Reviewed:    The following studies were reviewed:  TEE 10/10/2021: 1. Left ventricular ejection fraction, by estimation, is 55 to 60%. The  left ventricle has normal function. The left ventricle has no regional  wall motion abnormalities.   2. Right ventricular systolic function is normal. The right ventricular  size is mildly enlarged.   3. Left atrial size was mildly dilated. No left atrial/left atrial  appendage thrombus was detected.   4. Right atrial size was mildly dilated.   5. The mitral valve is normal in structure. Trivial mitral valve  regurgitation.   6. The aortic valve is normal in structure. Aortic valve regurgitation is  not visualized.   EKG:  EKG not ordered today.   Recent Labs: 03/11/2022: BUN 25; Creatinine, Ser 1.27; Potassium 5.3; Sodium 139  Recent Lipid Panel No results found for: "CHOL", "TRIG", "HDL", "CHOLHDL", "VLDL", "LDLCALC", "LDLDIRECT"  Physical Exam:    Vital Signs: BP 133/77   Pulse 77   Ht 6\' 4"  (1.93 m)   Wt (!) 332 lb 3.2 oz (150.7 kg)   SpO2 96%   BMI 40.44 kg/m     Wt Readings from Last 3 Encounters:  11/05/22 (!) 332 lb 3.2 oz (150.7 kg)  04/16/22 (!) 316 lb 6.4 oz (143.5 kg)  03/11/22 (!) 318 lb (144.2 kg)     General: 67 y.o. morbidly obese  male in no acute distress. HEENT: Normocephalic and atraumatic. Sclera clear.  Neck: Supple. No carotid bruits.  JVD difficult to assess due to body habitus. Heart: Irregularly irregular rhythm with normal rate. Distinct S1 and S2. No murmurs, gallops, or rubs.  Lungs: No increased work of breathing. Clear to ausculation bilaterally. No wheezes, rhonchi, or rales.  Abdomen: Soft, non-distended, and non-tender to palpation.  Extremities: 2+ pitting edema of bilateral lower extremities.  Skin: Warm and dry. Neuro: Alert and oriented x3. No focal deficits. Psych: Normal affect. Responds appropriately.   Assessment:    1. Persistent atrial fibrillation (HCC)   2. Lower extremity  edema   3. Weight gain   4. Atypical chest pain   5. Primary hypertension   6. Hyperlipidemia, unspecified hyperlipidemia type   7. Type 2 diabetes mellitus without complication, without long-term current use of insulin (HCC)   8. Obstructive sleep apnea   9. Difficulty sleeping     Plan:    Persistent Atrial Fibrillation He has had multiple cardioversions in past (last one in 09/2021) with return of atrial fibrillation and has now failed Multaq therapy. Plan has been for rate control given patient is asymptomatic. Not felt to be a candidate for ablation given morbid obesity with weight >300 lbs. - Rate controlled.  - Continue Nebivolol 10mg  twice daily. - Continue chronic anticoagulation with Eliquis 5mg  twice daily.  Chronic Lower Extremity Edema Weight Gain Patient reports a 20 pound weight gain over the past 2 weeks as well as worsening of his chronic lower extremity edema.  He attributes this to increased sodium intake and not being able to be as active due to her recent foot fracture.  However, he denies any shortness of breath, orthopnea, or PND. Echo in 10/2019 showed normal LV function. - Difficult to truly assess volume status due to body habitus. - Will check BNP.  He recently had a BMP checked at PCPs office earlier this month and renal function was normal. - Discussed importance of limiting salt intake. -  Advised patient to restart wearing compression stockings when cleared to do so by his podiatrist.  Also discussed elevating legs when possible. - Will likely have patient take Lasix (at least for a few days) but will wait for BMP results.  If BNP markedly elevated, will also repeat Echo.  Atypical Chest Pain Patient describes an isolated episode of chest pain yesterday that was clearly associated with hiccups.  He denies any chest pain outside of this 1 episode.  No exertional symptoms. - No additional ischemic workup necessary at this time.  However, advised patient to let us know if he has any additional episodes of chest pain.  Hypertension BP well controlled today but he states it has been running high at home. - Continue current medications: Valsartan 80mg  twice daily and Nebivolol 10mg  twice daily. Of note, he used to be on Valsartan 160mg  once daily but states another provider changed this to 80mg  twice daily because his BP was too low. - Asked patient to keep a BP/HR log for 2 weeks.  - He does describe some lightheadedness/ dizziness (usually when singing in the choir at church) that improves with drinking water so suspect he is not staying while hydrated.   Hyperlipidemia Recent lipid panel on 10/28/2022 at PCP's office: Total Cholesterol 157, Triglycerides 138, HDL 53, LDL 80.  - Continue Crestor 40mg  daily. Will provide refill of this today.  Type 2 Diabetes Mellitus Hemoglobin A1c 6.6% on 10/29/2022. - On Ozempic. - Management per PCP.  Obstructive Sleep Apnea - Continue CPAP. - Managed by Neurology.   Difficulty Sleeping Patient has a lot of difficulty sleeping.  He states it is a good night if he gets 5 hours of sleep.  However, I am concerned about this because he states he has fallen asleep briefly while driving multiple times after not sleeping well the night before. Thankfully, he has never been in an accident.  He states he will pull over when he feels himself getting  sleepy and nap for 3 hours.  No history of narcolepsy. - Patient does have obstructive sleep apnea and CPAP settings were  recently changed by neurology.  Hopefully this will help. - Discussed the importance of discussing this with PCP. Also emphasized the importance of being extremely careful while driving and encouraged him to get an do not drive if he knows he did not sleep on the night before.  Disposition: Follow up in 6 months.   Medication Adjustments/Labs and Tests Ordered: Current medicines are reviewed at length with the patient today.  Concerns regarding medicines are outlined above.  Orders Placed This Encounter  Procedures   Brain natriuretic peptide   Meds ordered this encounter  Medications   rosuvastatin (CRESTOR) 40 MG tablet    Sig: Take 1 tablet (40 mg total) by mouth daily.    Dispense:  90 tablet    Refill:  1    Patient Instructions  Medication Instructions:  No Changes *If you need a refill on your cardiac medications before your next appointment, please call your pharmacy*   Lab Work: BNP Today If you have labs (blood work) drawn today and your tests are completely normal, you will receive your results only by: MyChart Message (if you have MyChart) OR A paper copy in the mail If you have any lab test that is abnormal or we need to change your treatment, we will call you to review the results.   Testing/Procedures: No Testing   Follow-Up: At [facility], you and your health needs are our priority.  As part of our continuing mission to provide you with exceptional heart care, we have created designated Provider Care Teams.  These Care Teams include your primary Cardiologist (physician) and Advanced Practice Providers (APPs -  Physician Assistants and Nurse Practitioners) who all work together to provide you with the care you need, when you need it.  We recommend signing up for the patient portal called "MyChart".  Sign up information is  provided on this After Visit Summary.  MyChart is used to connect with patients for Virtual Visits (Telemedicine).  Patients are able to view lab/test results, encounter notes, upcoming appointments, etc.  Non-urgent messages can be sent to your provider as well.   To learn more about what you can do with MyChart, go to [email or web address].    Your next appointment:   6 month(s)  Provider:   Reatha Harps, MD    Other Instructions Recommend Discussing with Primary Care Provider Daytime Sleepiness.   Signed, Corrin Parker, PA-C  11/05/2022 8:37 AM    [facility]

## 2022-10-24 DIAGNOSIS — E291 Testicular hypofunction: Secondary | ICD-10-CM | POA: Diagnosis not present

## 2022-10-25 ENCOUNTER — Encounter: Payer: Self-pay | Admitting: Cardiovascular Disease

## 2022-10-28 DIAGNOSIS — E1140 Type 2 diabetes mellitus with diabetic neuropathy, unspecified: Secondary | ICD-10-CM | POA: Diagnosis not present

## 2022-10-28 DIAGNOSIS — E782 Mixed hyperlipidemia: Secondary | ICD-10-CM | POA: Diagnosis not present

## 2022-10-29 LAB — LAB REPORT - SCANNED
A1c: 6.6
EGFR: 94

## 2022-11-04 DIAGNOSIS — M25562 Pain in left knee: Secondary | ICD-10-CM | POA: Diagnosis not present

## 2022-11-04 DIAGNOSIS — G4733 Obstructive sleep apnea (adult) (pediatric): Secondary | ICD-10-CM | POA: Diagnosis not present

## 2022-11-04 DIAGNOSIS — Z789 Other specified health status: Secondary | ICD-10-CM | POA: Diagnosis not present

## 2022-11-04 DIAGNOSIS — R4789 Other speech disturbances: Secondary | ICD-10-CM | POA: Diagnosis not present

## 2022-11-05 ENCOUNTER — Ambulatory Visit: Payer: Medicare HMO | Attending: Student | Admitting: Student

## 2022-11-05 ENCOUNTER — Encounter: Payer: Self-pay | Admitting: Student

## 2022-11-05 VITALS — BP 133/77 | HR 77 | Ht 76.0 in | Wt 332.2 lb

## 2022-11-05 DIAGNOSIS — I10 Essential (primary) hypertension: Secondary | ICD-10-CM | POA: Diagnosis not present

## 2022-11-05 DIAGNOSIS — E785 Hyperlipidemia, unspecified: Secondary | ICD-10-CM

## 2022-11-05 DIAGNOSIS — I4819 Other persistent atrial fibrillation: Secondary | ICD-10-CM

## 2022-11-05 DIAGNOSIS — G4733 Obstructive sleep apnea (adult) (pediatric): Secondary | ICD-10-CM

## 2022-11-05 DIAGNOSIS — R0789 Other chest pain: Secondary | ICD-10-CM

## 2022-11-05 DIAGNOSIS — E119 Type 2 diabetes mellitus without complications: Secondary | ICD-10-CM | POA: Diagnosis not present

## 2022-11-05 DIAGNOSIS — G479 Sleep disorder, unspecified: Secondary | ICD-10-CM | POA: Diagnosis not present

## 2022-11-05 DIAGNOSIS — R635 Abnormal weight gain: Secondary | ICD-10-CM

## 2022-11-05 DIAGNOSIS — R600 Localized edema: Secondary | ICD-10-CM

## 2022-11-05 MED ORDER — ROSUVASTATIN CALCIUM 40 MG PO TABS
40.0000 mg | ORAL_TABLET | Freq: Every day | ORAL | 1 refills | Status: DC
Start: 2022-11-05 — End: 2023-08-29

## 2022-11-05 NOTE — Patient Instructions (Signed)
Medication Instructions:  No Changes *If you need a refill on your cardiac medications before your next appointment, please call your pharmacy*   Lab Work: BNP Today If you have labs (blood work) drawn today and your tests are completely normal, you will receive your results only by: MyChart Message (if you have MyChart) OR A paper copy in the mail If you have any lab test that is abnormal or we need to change your treatment, we will call you to review the results.   Testing/Procedures: No Testing   Follow-Up: At [facility], you and your health needs are our priority.  As part of our continuing mission to provide you with exceptional heart care, we have created designated Provider Care Teams.  These Care Teams include your primary Cardiologist (physician) and Advanced Practice Providers (APPs -  Physician Assistants and Nurse Practitioners) who all work together to provide you with the care you need, when you need it.  We recommend signing up for the patient portal called "MyChart".  Sign up information is provided on this After Visit Summary.  MyChart is used to connect with patients for Virtual Visits (Telemedicine).  Patients are able to view lab/test results, encounter notes, upcoming appointments, etc.  Non-urgent messages can be sent to your provider as well.   To learn more about what you can do with MyChart, go to [email or web address].    Your next appointment:   6 month(s)  Provider:   Reatha Harps, MD    Other Instructions Recommend Discussing with Primary Care Provider Daytime Sleepiness.

## 2022-11-06 DIAGNOSIS — M25562 Pain in left knee: Secondary | ICD-10-CM | POA: Diagnosis not present

## 2022-11-06 LAB — BRAIN NATRIURETIC PEPTIDE: BNP: 24.5 pg/mL (ref 0.0–100.0)

## 2022-11-08 ENCOUNTER — Other Ambulatory Visit: Payer: Self-pay

## 2022-11-08 DIAGNOSIS — M79672 Pain in left foot: Secondary | ICD-10-CM | POA: Diagnosis not present

## 2022-11-08 DIAGNOSIS — R197 Diarrhea, unspecified: Secondary | ICD-10-CM | POA: Diagnosis not present

## 2022-11-08 DIAGNOSIS — R109 Unspecified abdominal pain: Secondary | ICD-10-CM | POA: Diagnosis not present

## 2022-11-08 DIAGNOSIS — S92302D Fracture of unspecified metatarsal bone(s), left foot, subsequent encounter for fracture with routine healing: Secondary | ICD-10-CM | POA: Diagnosis not present

## 2022-11-08 DIAGNOSIS — Z6841 Body Mass Index (BMI) 40.0 and over, adult: Secondary | ICD-10-CM | POA: Diagnosis not present

## 2022-11-08 MED ORDER — FUROSEMIDE 40 MG PO TABS
ORAL_TABLET | ORAL | 3 refills | Status: DC
Start: 2022-11-08 — End: 2023-04-04

## 2022-11-13 DIAGNOSIS — Z6839 Body mass index (BMI) 39.0-39.9, adult: Secondary | ICD-10-CM | POA: Diagnosis not present

## 2022-11-13 DIAGNOSIS — R197 Diarrhea, unspecified: Secondary | ICD-10-CM | POA: Diagnosis not present

## 2022-11-15 DIAGNOSIS — E1129 Type 2 diabetes mellitus with other diabetic kidney complication: Secondary | ICD-10-CM | POA: Diagnosis not present

## 2022-11-15 DIAGNOSIS — E782 Mixed hyperlipidemia: Secondary | ICD-10-CM | POA: Diagnosis not present

## 2022-11-15 DIAGNOSIS — R809 Proteinuria, unspecified: Secondary | ICD-10-CM | POA: Diagnosis not present

## 2022-11-15 DIAGNOSIS — I10 Essential (primary) hypertension: Secondary | ICD-10-CM | POA: Diagnosis not present

## 2022-11-15 DIAGNOSIS — Z6841 Body Mass Index (BMI) 40.0 and over, adult: Secondary | ICD-10-CM | POA: Diagnosis not present

## 2022-11-16 DIAGNOSIS — E782 Mixed hyperlipidemia: Secondary | ICD-10-CM | POA: Diagnosis not present

## 2022-11-16 DIAGNOSIS — I10 Essential (primary) hypertension: Secondary | ICD-10-CM | POA: Diagnosis not present

## 2022-11-16 DIAGNOSIS — E1129 Type 2 diabetes mellitus with other diabetic kidney complication: Secondary | ICD-10-CM | POA: Diagnosis not present

## 2022-11-19 DIAGNOSIS — M5136 Other intervertebral disc degeneration, lumbar region: Secondary | ICD-10-CM | POA: Diagnosis not present

## 2022-11-19 DIAGNOSIS — S92302D Fracture of unspecified metatarsal bone(s), left foot, subsequent encounter for fracture with routine healing: Secondary | ICD-10-CM | POA: Diagnosis not present

## 2022-11-19 DIAGNOSIS — E1142 Type 2 diabetes mellitus with diabetic polyneuropathy: Secondary | ICD-10-CM | POA: Diagnosis not present

## 2022-11-21 DIAGNOSIS — E1129 Type 2 diabetes mellitus with other diabetic kidney complication: Secondary | ICD-10-CM | POA: Diagnosis not present

## 2022-11-21 DIAGNOSIS — Z6839 Body mass index (BMI) 39.0-39.9, adult: Secondary | ICD-10-CM | POA: Diagnosis not present

## 2022-11-21 DIAGNOSIS — R4189 Other symptoms and signs involving cognitive functions and awareness: Secondary | ICD-10-CM | POA: Diagnosis not present

## 2022-11-21 DIAGNOSIS — R809 Proteinuria, unspecified: Secondary | ICD-10-CM | POA: Diagnosis not present

## 2022-11-21 DIAGNOSIS — B351 Tinea unguium: Secondary | ICD-10-CM | POA: Diagnosis not present

## 2022-11-25 DIAGNOSIS — E291 Testicular hypofunction: Secondary | ICD-10-CM | POA: Diagnosis not present

## 2022-11-27 DIAGNOSIS — Z0000 Encounter for general adult medical examination without abnormal findings: Secondary | ICD-10-CM | POA: Diagnosis not present

## 2022-11-27 DIAGNOSIS — Z136 Encounter for screening for cardiovascular disorders: Secondary | ICD-10-CM | POA: Diagnosis not present

## 2022-11-27 DIAGNOSIS — Z139 Encounter for screening, unspecified: Secondary | ICD-10-CM | POA: Diagnosis not present

## 2022-11-27 DIAGNOSIS — Z1331 Encounter for screening for depression: Secondary | ICD-10-CM | POA: Diagnosis not present

## 2022-11-27 DIAGNOSIS — Z1339 Encounter for screening examination for other mental health and behavioral disorders: Secondary | ICD-10-CM | POA: Diagnosis not present

## 2022-11-27 DIAGNOSIS — E663 Overweight: Secondary | ICD-10-CM | POA: Diagnosis not present

## 2022-11-27 DIAGNOSIS — Z6839 Body mass index (BMI) 39.0-39.9, adult: Secondary | ICD-10-CM | POA: Diagnosis not present

## 2022-12-04 DIAGNOSIS — E349 Endocrine disorder, unspecified: Secondary | ICD-10-CM | POA: Diagnosis not present

## 2022-12-04 DIAGNOSIS — S92302D Fracture of unspecified metatarsal bone(s), left foot, subsequent encounter for fracture with routine healing: Secondary | ICD-10-CM | POA: Diagnosis not present

## 2022-12-04 DIAGNOSIS — Z8639 Personal history of other endocrine, nutritional and metabolic disease: Secondary | ICD-10-CM | POA: Diagnosis not present

## 2022-12-04 DIAGNOSIS — E291 Testicular hypofunction: Secondary | ICD-10-CM | POA: Diagnosis not present

## 2022-12-04 DIAGNOSIS — Z79899 Other long term (current) drug therapy: Secondary | ICD-10-CM | POA: Diagnosis not present

## 2022-12-04 DIAGNOSIS — N401 Enlarged prostate with lower urinary tract symptoms: Secondary | ICD-10-CM | POA: Diagnosis not present

## 2022-12-04 DIAGNOSIS — M79672 Pain in left foot: Secondary | ICD-10-CM | POA: Diagnosis not present

## 2022-12-04 DIAGNOSIS — Q984 Klinefelter syndrome, unspecified: Secondary | ICD-10-CM | POA: Diagnosis not present

## 2022-12-04 DIAGNOSIS — E785 Hyperlipidemia, unspecified: Secondary | ICD-10-CM | POA: Diagnosis not present

## 2022-12-16 DIAGNOSIS — M25561 Pain in right knee: Secondary | ICD-10-CM | POA: Diagnosis not present

## 2022-12-16 DIAGNOSIS — E782 Mixed hyperlipidemia: Secondary | ICD-10-CM | POA: Diagnosis not present

## 2022-12-16 DIAGNOSIS — G8929 Other chronic pain: Secondary | ICD-10-CM | POA: Diagnosis not present

## 2022-12-16 DIAGNOSIS — I10 Essential (primary) hypertension: Secondary | ICD-10-CM | POA: Diagnosis not present

## 2022-12-16 DIAGNOSIS — E1129 Type 2 diabetes mellitus with other diabetic kidney complication: Secondary | ICD-10-CM | POA: Diagnosis not present

## 2022-12-17 DIAGNOSIS — Z6841 Body Mass Index (BMI) 40.0 and over, adult: Secondary | ICD-10-CM | POA: Diagnosis not present

## 2022-12-17 DIAGNOSIS — N529 Male erectile dysfunction, unspecified: Secondary | ICD-10-CM | POA: Diagnosis not present

## 2022-12-19 DIAGNOSIS — M14679 Charcot's joint, unspecified ankle and foot: Secondary | ICD-10-CM | POA: Diagnosis not present

## 2022-12-19 DIAGNOSIS — I10 Essential (primary) hypertension: Secondary | ICD-10-CM | POA: Diagnosis not present

## 2022-12-19 DIAGNOSIS — Z7901 Long term (current) use of anticoagulants: Secondary | ICD-10-CM | POA: Diagnosis not present

## 2022-12-19 DIAGNOSIS — E1169 Type 2 diabetes mellitus with other specified complication: Secondary | ICD-10-CM | POA: Diagnosis not present

## 2022-12-19 DIAGNOSIS — Z79899 Other long term (current) drug therapy: Secondary | ICD-10-CM | POA: Diagnosis not present

## 2022-12-19 DIAGNOSIS — I700 Atherosclerosis of aorta: Secondary | ICD-10-CM | POA: Diagnosis not present

## 2022-12-19 DIAGNOSIS — I4891 Unspecified atrial fibrillation: Secondary | ICD-10-CM | POA: Diagnosis not present

## 2022-12-19 DIAGNOSIS — R0602 Shortness of breath: Secondary | ICD-10-CM | POA: Diagnosis not present

## 2022-12-19 DIAGNOSIS — I517 Cardiomegaly: Secondary | ICD-10-CM | POA: Diagnosis not present

## 2022-12-19 DIAGNOSIS — Z20822 Contact with and (suspected) exposure to covid-19: Secondary | ICD-10-CM | POA: Diagnosis not present

## 2022-12-19 DIAGNOSIS — R079 Chest pain, unspecified: Secondary | ICD-10-CM | POA: Diagnosis not present

## 2022-12-19 DIAGNOSIS — E7800 Pure hypercholesterolemia, unspecified: Secondary | ICD-10-CM | POA: Diagnosis not present

## 2022-12-19 DIAGNOSIS — E785 Hyperlipidemia, unspecified: Secondary | ICD-10-CM | POA: Diagnosis not present

## 2022-12-19 DIAGNOSIS — R9431 Abnormal electrocardiogram [ECG] [EKG]: Secondary | ICD-10-CM | POA: Diagnosis not present

## 2022-12-19 DIAGNOSIS — E669 Obesity, unspecified: Secondary | ICD-10-CM | POA: Diagnosis not present

## 2022-12-20 DIAGNOSIS — I4891 Unspecified atrial fibrillation: Secondary | ICD-10-CM | POA: Diagnosis not present

## 2022-12-20 DIAGNOSIS — R0602 Shortness of breath: Secondary | ICD-10-CM | POA: Diagnosis not present

## 2022-12-20 DIAGNOSIS — M14679 Charcot's joint, unspecified ankle and foot: Secondary | ICD-10-CM | POA: Diagnosis not present

## 2022-12-20 DIAGNOSIS — R079 Chest pain, unspecified: Secondary | ICD-10-CM | POA: Diagnosis not present

## 2022-12-24 DIAGNOSIS — F603 Borderline personality disorder: Secondary | ICD-10-CM | POA: Diagnosis not present

## 2022-12-24 DIAGNOSIS — Z79899 Other long term (current) drug therapy: Secondary | ICD-10-CM | POA: Diagnosis not present

## 2022-12-24 DIAGNOSIS — Q984 Klinefelter syndrome, unspecified: Secondary | ICD-10-CM | POA: Diagnosis not present

## 2022-12-24 DIAGNOSIS — E349 Endocrine disorder, unspecified: Secondary | ICD-10-CM | POA: Diagnosis not present

## 2022-12-26 ENCOUNTER — Telehealth: Payer: Self-pay | Admitting: Cardiovascular Disease

## 2022-12-26 NOTE — Telephone Encounter (Signed)
Left voicemail for patient to return call to office. 

## 2022-12-26 NOTE — Telephone Encounter (Signed)
Patient was seen in ED for chest pain. Informed to follow up with cardiology for appointment. He is scheduled with Peter Buck on 7/15

## 2022-12-26 NOTE — Telephone Encounter (Signed)
Pt was returning LPN call and is requesting a callback asap. Please advise

## 2022-12-26 NOTE — Telephone Encounter (Signed)
Pt sent this via mychart to our scheduling pool:    I was admitted to [facility] July 3rd because of chest pains. They released me on July 5th after giving me a chemical stress test which they said I passed. The hospitalist that was in charge of me was Dr. Tomasa Blase. I want to know why I had the severe chest pains that woke me from a sound sleep at 3:00 AM.

## 2022-12-27 DIAGNOSIS — Z6839 Body mass index (BMI) 39.0-39.9, adult: Secondary | ICD-10-CM | POA: Diagnosis not present

## 2022-12-27 DIAGNOSIS — K219 Gastro-esophageal reflux disease without esophagitis: Secondary | ICD-10-CM | POA: Diagnosis not present

## 2022-12-27 DIAGNOSIS — R0789 Other chest pain: Secondary | ICD-10-CM | POA: Diagnosis not present

## 2022-12-27 DIAGNOSIS — Z7689 Persons encountering health services in other specified circumstances: Secondary | ICD-10-CM | POA: Diagnosis not present

## 2022-12-27 DIAGNOSIS — I4891 Unspecified atrial fibrillation: Secondary | ICD-10-CM | POA: Diagnosis not present

## 2022-12-30 ENCOUNTER — Ambulatory Visit: Payer: Medicare HMO | Attending: Nurse Practitioner | Admitting: Nurse Practitioner

## 2022-12-30 ENCOUNTER — Encounter: Payer: Self-pay | Admitting: Nurse Practitioner

## 2022-12-30 ENCOUNTER — Other Ambulatory Visit: Payer: Self-pay

## 2022-12-30 VITALS — BP 126/84 | HR 89 | Ht 76.0 in | Wt 329.0 lb

## 2022-12-30 DIAGNOSIS — I4819 Other persistent atrial fibrillation: Secondary | ICD-10-CM

## 2022-12-30 DIAGNOSIS — I10 Essential (primary) hypertension: Secondary | ICD-10-CM | POA: Diagnosis not present

## 2022-12-30 DIAGNOSIS — E785 Hyperlipidemia, unspecified: Secondary | ICD-10-CM

## 2022-12-30 DIAGNOSIS — E119 Type 2 diabetes mellitus without complications: Secondary | ICD-10-CM | POA: Diagnosis not present

## 2022-12-30 DIAGNOSIS — R600 Localized edema: Secondary | ICD-10-CM | POA: Diagnosis not present

## 2022-12-30 DIAGNOSIS — R0789 Other chest pain: Secondary | ICD-10-CM | POA: Diagnosis not present

## 2022-12-30 DIAGNOSIS — G4733 Obstructive sleep apnea (adult) (pediatric): Secondary | ICD-10-CM

## 2022-12-30 NOTE — Progress Notes (Signed)
Office Visit    Patient Name: Peter Buck Date of Encounter: 12/30/2022  Primary Care Provider:  Charlott Rakes, MD Primary Cardiologist:  Reatha Harps, MD  Chief Complaint    67 year old male with a history of atypical chest pain, persistent atrial fibrillation, hypertension, hyperlipidemia, type 2 diabetes, OSA on CPAP, chronic pain syndrome, bipolar disorder, and obesity who presents for follow-up related to chest pain.  Past Medical History    Past Medical History:  Diagnosis Date   Aftercare following surgery 04/23/2018   Anemia    Arthritis    Arthritis, midfoot 12/05/2016   Asthma    Bipolar affective disorder, depressed (HCC) 02/13/2016   Bipolar disorder (HCC)    Body mass index (bmi) 38.0-38.9, adult 09/16/2017   Body mass index (BMI) 38.0-38.9, adult 09/16/2017   Formatting of this note might be different from the original. IMO 10/01 Updates   Borderline personality disorder (HCC) 04/29/2016   Cervical radiculopathy 02/02/2019   Last Assessment & Plan:  Formatting of this note might be different from the original. MRI of the cervical spine shows cervical spondylosis with facet arthrosis most notable for mild to moderate left neural foraminal stenosis at C3-4.  Per patient, nerve conduction studies showed right C7-8 radiculopathy.    Continue with current regimen as outlined in lumbar degenerative disc plan.   Chronic pain of left knee 10/19/2019   Chronic pain syndrome 02/02/2019   Comprehensive diabetic foot examination, type 2 DM, encounter for (HCC) 12/25/2015   DDD (degenerative disc disease), lumbar 11/25/2017   Last Assessment & Plan:  Formatting of this note might be different from the original. 67 year old male with chronic, predominantly left-sided low back pain with radiation into the left hip and groin.  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 male with Low back pain, and acute on chronic low back pain with left hip, knee, and foot pain.  Today I will treat with the patient's current medication of tramadol and diclofenac and add relafen (temporarily d/c diclofenac) for 10 days and increase lyrica. He will continue with his current appt with his neurosurgery.  Mr. Shi will return to    Muscle spasm of back 02/17/2018   Last Assessment & Plan:  Formatting of this note might be different from the original. Continue with tizanidine 4 mg as needed for muscle spasming up to 3 times daily.   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 Review of prior UDS results are within normal limits.  [place] controlled substance registry reviewed and there were no inconsistencies noted.   Paroxysmal atrial fibrillation (HCC) 12/23/2019   Plantar fasciitis 01/02/2017  Primary osteoarthritis of both first carpometacarpal joints 08/25/2019   Second degree burn of finger of right hand 08/25/2019   Sleep apnea    Therapeutic opioid induced constipation 06/22/2019   Last Assessment & Plan:  Formatting of this note might be different from the original. Continue amitiza b.i.d.   [place] 08/25/2019   Past Surgical  History:  Procedure Laterality Date   APPENDECTOMY     CARDIOVERSION N/A 07/05/2020   Procedure: CARDIOVERSION;  Surgeon: Sande Rives, MD;  Location[facility] ENDOSCOPY;  Service: Cardiovascular;  Laterality: N/A;   CARDIOVERSION N/A 10/10/2021   Procedure: CARDIOVERSION;  Surgeon: Lewayne Bunting, MD;  Location: [facility] ENDOSCOPY;  Service: Cardiovascular;  Laterality: N/A;   CHOLECYSTECTOMY     INNER EAR SURGERY     KNEE ARTHROSCOPY     TEE WITHOUT CARDIOVERSION N/A 10/10/2021   Procedure: TRANSESOPHAGEAL ECHOCARDIOGRAM (TEE);  Surgeon: Lewayne Bunting, MD;  Location: [facility] ENDOSCOPY;  Service: Cardiovascular;  Laterality: N/A;    Allergies  Allergies  Allergen Reactions   Augmentin [Amoxicillin-Pot Clavulanate] Anaphylaxis   Fish Allergy Anaphylaxis    Specifically Salmon   Iodinated Contrast Media Anaphylaxis, Hives, Other (See Comments) and Rash    Severe hypotension Developed reaction a day and a half after a lumbar nerve root block on 09/22/17.  Treated by dermatologist with prednisone taper and antihistamines. Developed reaction a day and a half after a lumbar nerve root block on 09/22/17.  Treated by dermatologist with prednisone taper and antihistamines. Developed reaction a day and a half after a lumbar nerve root block on 09/22/17.  Treated by dermatologist with prednisone taper and antihistamines.    Ketorolac Anaphylaxis   Toradol [Ketorolac Tromethamine] Anaphylaxis    "It put me in shock.  My blood pressure tanked and I was itchy all over; had to go to the ER."   Atorvastatin Other (See Comments)    Severe back ache   Guaifenesin Swelling and Other (See Comments)    "It sorta made my throat close up."    Penicillins Dermatitis   Statins Other (See Comments)    Severe back ache   Alendronate Sodium Other (See Comments)    Unknown reaction    Dextromethorphan Other (See Comments)    Unknown reaction   Lovastatin Other (See Comments)    Severe joint pain   Pine Tar  Other (See Comments)    Arms get puffy and itching   Pseudoephedrine Other (See Comments)    "It dries me out.  I take it when my other allergy medicine isn't working."     Labs/Other Studies Reviewed    The following studies were reviewed today:  Cardiac Studies & Procedures     STRESS TESTS  MYOCARDIAL PERFUSION IMAGING 12/20/2022    TEE  ECHO TEE 10/10/2021  Narrative TRANSESOPHOGEAL ECHO REPORT    Patient Name:   JET ARMBRUST Date of Exam: 10/10/2021 Medical Rec #:  [id number]     Height:       75.5 in Accession #:    [id number]    Weight:       345.0 lb Date of Birth:  1955-07-31    BSA:          2.780 m Patient Age:    65 years      BP:           110/70 mmHg Patient Gender: M             HR:  85 bpm. Exam Location:  Inpatient  Procedure: Transesophageal Echo, Cardiac Doppler and Color Doppler  Indications:     Atrial fibrillation I48.91  History:         Patient has prior history of Echocardiogram examinations, most recent 10/23/2019. Risk Factors:Hypertension, Diabetes, Dyslipidemia and Sleep Apnea.  Sonographer:     Leta Jungling RDCS Referring Phys:  [id number] BRIAN S CRENSHAW Diagnosing Phys: Olga Millers MD  PROCEDURE: After discussion of the risks and benefits of a TEE, an informed consent was obtained from the patient. TEE procedure time was 6 minutes. The transesophogeal probe was passed without difficulty through the esophogus of the patient. Imaged were obtained with the patient in a left lateral decubitus position. Sedation performed by different physician. The patient was monitored while under deep sedation. Anesthestetic sedation was provided intravenously by Anesthesiology: 250.84mg  of Propofol. Image quality was good. The patient's vital signs; including heart rate, blood pressure, and oxygen saturation; remained stable throughout the procedure. The patient developed no complications during the procedure. A successful direct  current cardioversion was performed at 200 joules with 1 attempt.  IMPRESSIONS   1. Left ventricular ejection fraction, by estimation, is 55 to 60%. The left ventricle has normal function. The left ventricle has no regional wall motion abnormalities. 2. Right ventricular systolic function is normal. The right ventricular size is mildly enlarged. 3. Left atrial size was mildly dilated. No left atrial/left atrial appendage thrombus was detected. 4. Right atrial size was mildly dilated. 5. The mitral valve is normal in structure. Trivial mitral valve regurgitation. 6. The aortic valve is normal in structure. Aortic valve regurgitation is not visualized.  FINDINGS Left Ventricle: Left ventricular ejection fraction, by estimation, is 55 to 60%. The left ventricle has normal function. The left ventricle has no regional wall motion abnormalities. The left ventricular internal cavity size was normal in size.  Right Ventricle: The right ventricular size is mildly enlarged. Right ventricular systolic function is normal.  Left Atrium: Left atrial size was mildly dilated. No left atrial/left atrial appendage thrombus was detected.  Right Atrium: Right atrial size was mildly dilated.  Pericardium: There is no evidence of pericardial effusion.  Mitral Valve: The mitral valve is normal in structure. Trivial mitral valve regurgitation.  Tricuspid Valve: The tricuspid valve is normal in structure. Tricuspid valve regurgitation is mild.  Aortic Valve: The aortic valve is normal in structure. Aortic valve regurgitation is not visualized.  Pulmonic Valve: The pulmonic valve was normal in structure. Pulmonic valve regurgitation is not visualized.  Aorta: The aortic root is normal in size and structure. There is minimal (Grade I) plaque involving the descending aorta.  IAS/Shunts: No atrial level shunt detected by color flow Doppler.   LEFT VENTRICLE PLAX 2D LVOT diam:     2.30 cm LVOT Area:      4.15 cm   RIGHT VENTRICLE RV Basal diam:  5.20 cm   AORTA Ao Root diam: 2.60 cm Ao Asc diam:  3.10 cm   SHUNTS Systemic Diam: 2.30 cm  Olga Millers MD Electronically signed by Olga Millers MD Signature Date/Time: 10/10/2021/2:59:43 PM    Final           Recent Labs: 03/11/2022: BUN 25; Creatinine, Ser 1.27; Potassium 5.3; Sodium 139 11/05/2022: BNP 24.5  Recent Lipid Panel No results found for: "CHOL", "TRIG", "HDL", "CHOLHDL", "VLDL", "LDLCALC", "LDLDIRECT"  History of Present Illness    67 year old male with the above past medical history including atypical chest pain, persistent atrial fibrillation, hypertension,  hyperlipidemia, type 2 diabetes, OSA on CPAP, chronic pain syndrome, bipolar disorder, and obesity.  He has a history of persistent atrial fibrillation and has undergone multiple cardioversions in the past (most recently in 09/2021).  He has failed Multaq therapy.  Echocardiogram in 2021 showed EF 55 to 60%.  Myoview in 2021 showed no evidence of ischemia.  Recent echocardiogram in 09/2021 showed EF 55 to 60%, normal LV function, no RWMA, normal RV, no significant valvular abnormalities.  At his follow-up visit in October 2023 he was noted to be back in atrial fibrillation, he was asymptomatic.  Rate control strategy was recommended.  He was not felt to be a candidate for ablation given morbid obesity.  He was last seen in the office on 11/05/2022 and noted a 20 pound weight gain in the past 2 weeks, increased bilateral lower extremity edema.  He also noted an episode of chest discomfort that was associated with hiccups.  He also noted frequent lightheadedness/dizziness as well as difficulty sleeping.  He was started on Lasix.  He was advised to monitor his BP.  He was hospitalized at [facility] in the setting of chest pain.  Troponin was reportedly negative (results not available for review).  Lexiscan Myoview was negative for ischemia.   He presents today for  follow-up.  Since his last visit he has been stable from a cardiac standpoint.  He has had 1 brief episode of chest pain at rest.  He denies any exertional symptoms.  He has stable nonpitting bilateral lower extremity edema.  He denies any palpitations, dyspnea, pnd, orthopnea, weight gain.    Home Medications    Current Outpatient Medications  Medication Sig Dispense Refill   acetaminophen (TYLENOL) 650 MG CR tablet Take by mouth.     betamethasone dipropionate 0.05 % cream Apply 1 application. topically daily as needed (itching).     Calcium Citrate-Vitamin D (CALCIUM CITRATE + D3 PO) Take 1 tablet by mouth at bedtime. 630 mg /12.5 mcg     celecoxib (CELEBREX) 200 MG capsule Take 200 mg by mouth 2 (two) times daily.     diclofenac Sodium (VOLTAREN) 1 % GEL Apply 1 g topically 4 (four) times daily as needed (pain).     DULoxetine (CYMBALTA) 30 MG capsule Take by mouth.     ELIQUIS 5 MG TABS tablet Take 1 tablet by mouth twice daily 180 tablet 1   Fexofenadine HCl (MUCINEX ALLERGY PO) Take by mouth daily as needed.     FIBER ADULT GUMMIES PO Take 10 g by mouth daily.     furosemide (LASIX) 40 MG tablet Take 40 mg daily for the next 3 days then take 40 mg daily if needed (Patient taking differently: Take 40 mg by mouth daily.) 30 tablet 3   Horse Chestnut 300 MG CAPS Take 300 mg by mouth in the morning and at bedtime.     lubiprostone (AMITIZA) 24 MCG capsule 1 capsule with food and water Orally Twice a day for 30 day(s)     Magnesium Citrate 200 MG TABS Take 200 mg by mouth daily. Gummies     mometasone (NASONEX) 50 MCG/ACT nasal spray Place 1 spray into the nose daily.     nebivolol (BYSTOLIC) 10 MG tablet Take 1 tablet by mouth twice daily 180 tablet 3   ondansetron (ZOFRAN-ODT) 4 MG disintegrating tablet DISSOLVE 1 TABLET IN MOUTH EVERY 8 HOURS AS NEEDED     Oxcarbazepine (TRILEPTAL) 300 MG tablet Take 300 mg by mouth 2 (  two) times daily.     OZEMPIC, 2 MG/DOSE, 8 MG/3ML SOPN Inject 2 mg  into the skin once a week.     pramoxine (PROCTOFOAM) 1 % foam as directed Rectal     RELISTOR 150 MG TABS Take 3 tablets by mouth daily.     rosuvastatin (CRESTOR) 40 MG tablet Take 1 tablet (40 mg total) by mouth daily. 90 tablet 1   testosterone cypionate (DEPOTESTOSTERONE CYPIONATE) 200 MG/ML injection Inject 200 mg into the muscle every 21 ( twenty-one) days.     tiZANidine (ZANAFLEX) 4 MG tablet Take 4 mg by mouth every 6 (six) hours as needed for muscle spasms.     valsartan (DIOVAN) 80 MG tablet Take 1 tablet (80 mg total) by mouth See admin instructions. Take 80 mg in the morning and 160 mg at bedtime (Patient taking differently: Take 80 mg by mouth See admin instructions. Take 80 mg Twice Daily) 30 tablet 6   EPINEPHrine 0.3 mg/0.3 mL IJ SOAJ injection Inject into the muscle. (Patient not taking: Reported on 12/30/2022)     OZEMPIC, 1 MG/DOSE, 4 MG/3ML SOPN Inject 1 mg into the muscle once a week. INJECT THE CONTENTS OF 1 PEN INJECTOR SUBQ ONCE WEEKLY (Patient not taking: Reported on 12/30/2022)     No current facility-administered medications for this visit.     Review of Systems    He denies chest pain, palpitations, dyspnea, pnd, orthopnea, n, v, dizziness, syncope, edema, weight gain, or early satiety. All other systems reviewed and are otherwise negative except as noted above.   Physical Exam    VS:  BP 126/84   Pulse 89   Ht 6\' 4"  (1.93 m)   Wt (!) 329 lb (149.2 kg)   SpO2 96%   BMI 40.05 kg/m  GEN: Well nourished, well developed, in no acute distress. HEENT: normal. Neck: Supple, no JVD, carotid bruits, or masses. Cardiac: RRR, no murmurs, rubs, or gallops. No clubbing, cyanosis, edema.  Radials/DP/PT 2+ and equal bilaterally.  Respiratory:  Respirations regular and unlabored, clear to auscultation bilaterally. GI: Soft, nontender, nondistended, BS + x 4. MS: no deformity or atrophy. Skin: warm and dry, no rash. Neuro:  Strength and sensation are intact. Psych:  Normal affect.  Accessory Clinical Findings    ECG personally reviewed by me today - EKG Interpretation Date/Time:  Monday December 30 2022 14:36:52 EDT Ventricular Rate:  89 PR Interval:    QRS Duration:  88 QT Interval:  326 QTC Calculation: 396 R Axis:   39  Text Interpretation: Atrial fibrillation When compared with ECG of 10-Oct-2021 08:32, Atrial fibrillation has replaced Sinus rhythm QT has shortened Confirmed by Bernadene Person ([id number]) on 12/30/2022 3:05:29 PM  - no acute changes.   Lab Results  Component Value Date   WBC 7.1 08/28/2021   HGB 17.0 08/28/2021   HCT 51.4 (H) 08/28/2021   MCV 86 08/28/2021   PLT 190 08/28/2021   Lab Results  Component Value Date   CREATININE 1.27 03/11/2022   BUN 25 03/11/2022   NA 139 03/11/2022   K 5.3 (H) 03/11/2022   CL 101 03/11/2022   CO2 28 03/11/2022   No results found for: "ALT", "AST", "GGT", "ALKPHOS", "BILITOT" No results found for: "CHOL", "HDL", "LDLCALC", "LDLDIRECT", "TRIG", "CHOLHDL"  No results found for: "HGBA1C"  Assessment & Plan    1. Atypical chest pain: Prior echo in 09/2021 showed EF 55 to 60%, normal LV function, no RWMA, normal RV, no significant valvular abnormalities.  Recent hospitalization in the setting of chest pain.  Troponin was negative per patient (results not available for review).  Lexiscan Myoview was negative for ischemia.  Symptoms occurred at night while he was lying in bed and lasted for hours.  He has had 1 episode of chest discomfort since that again occurred during the night.  He denies any exertional symptoms.  Work up to date reassuring.  No indication for further ischemic evaluation at this point in time.  Continue to monitor symptoms.  Reviewed ED precautions.  2. Bilateral lower extremity edema: He has stable nonpitting bilateral lower extremity edema.   Generally euvolemic and well compensated on exam. At his last visit he was prescribed Lasix as needed, which he has not taken.   Recommend he  take Lasix 40 mg daily.  Will check BMET in 2 weeks.   3. Persistent atrial fibrillation: Rate controlled.  Denies any palpitations.  Continue nebivolol, Eliquis.  4. Hypertension: BP well controlled. Continue current antihypertensive regimen.   5. Hyperlipidemia: LDL was 58 in 11/2022.  Continue Crestor.  6. Type 2 diabetes: A1c was 6.6 in 10/2022. Monitored and managed per PCP.   7. OSA: Adherent to CPAP.   8. Disposition: Follow-up scheduled in 04/2023 with Dr. Flora Lipps.       Joylene Grapes, NP 12/30/2022, 4:42 PM

## 2022-12-30 NOTE — Patient Instructions (Signed)
Medication Instructions:  Furosemide (Lasix) 40 mg daily  *If you need a refill on your cardiac medications before your next appointment, please call your pharmacy*   Lab Work: BMET 2 weeks    Testing/Procedures: NONE ordered at this time of appointment     Follow-Up: At [facility], you and your health needs are our priority.  As part of our continuing mission to provide you with exceptional heart care, we have created designated Provider Care Teams.  These Care Teams include your primary Cardiologist (physician) and Advanced Practice Providers (APPs -  Physician Assistants and Nurse Practitioners) who all work together to provide you with the care you need, when you need it.  We recommend signing up for the patient portal called "MyChart".  Sign up information is provided on this After Visit Summary.  MyChart is used to connect with patients for Virtual Visits (Telemedicine).  Patients are able to view lab/test results, encounter notes, upcoming appointments, etc.  Non-urgent messages can be sent to your provider as well.   To learn more about what you can do with MyChart, go to [email or web address].    Your next appointment:    Keep Follow up   Provider:   Reatha Harps, MD

## 2023-01-01 DIAGNOSIS — S92302D Fracture of unspecified metatarsal bone(s), left foot, subsequent encounter for fracture with routine healing: Secondary | ICD-10-CM | POA: Diagnosis not present

## 2023-01-01 DIAGNOSIS — M79672 Pain in left foot: Secondary | ICD-10-CM | POA: Diagnosis not present

## 2023-01-16 DIAGNOSIS — E782 Mixed hyperlipidemia: Secondary | ICD-10-CM | POA: Diagnosis not present

## 2023-01-16 DIAGNOSIS — E1129 Type 2 diabetes mellitus with other diabetic kidney complication: Secondary | ICD-10-CM | POA: Diagnosis not present

## 2023-01-17 DIAGNOSIS — L03031 Cellulitis of right toe: Secondary | ICD-10-CM | POA: Insufficient documentation

## 2023-01-17 DIAGNOSIS — M14672 Charcot's joint, left ankle and foot: Secondary | ICD-10-CM | POA: Diagnosis not present

## 2023-01-23 DIAGNOSIS — E291 Testicular hypofunction: Secondary | ICD-10-CM | POA: Diagnosis not present

## 2023-01-27 DIAGNOSIS — F603 Borderline personality disorder: Secondary | ICD-10-CM | POA: Diagnosis not present

## 2023-01-29 DIAGNOSIS — E1142 Type 2 diabetes mellitus with diabetic polyneuropathy: Secondary | ICD-10-CM | POA: Diagnosis not present

## 2023-01-29 DIAGNOSIS — E11610 Type 2 diabetes mellitus with diabetic neuropathic arthropathy: Secondary | ICD-10-CM | POA: Insufficient documentation

## 2023-01-29 DIAGNOSIS — M9963 Osseous and subluxation stenosis of intervertebral foramina of lumbar region: Secondary | ICD-10-CM | POA: Diagnosis not present

## 2023-01-29 DIAGNOSIS — E6609 Other obesity due to excess calories: Secondary | ICD-10-CM | POA: Diagnosis not present

## 2023-01-29 DIAGNOSIS — G8929 Other chronic pain: Secondary | ICD-10-CM | POA: Diagnosis not present

## 2023-01-29 DIAGNOSIS — Z7985 Long-term (current) use of injectable non-insulin antidiabetic drugs: Secondary | ICD-10-CM | POA: Diagnosis not present

## 2023-01-29 DIAGNOSIS — Z6837 Body mass index (BMI) 37.0-37.9, adult: Secondary | ICD-10-CM | POA: Diagnosis not present

## 2023-01-30 DIAGNOSIS — L03031 Cellulitis of right toe: Secondary | ICD-10-CM | POA: Diagnosis not present

## 2023-01-30 DIAGNOSIS — M14672 Charcot's joint, left ankle and foot: Secondary | ICD-10-CM | POA: Diagnosis not present

## 2023-02-01 ENCOUNTER — Other Ambulatory Visit: Payer: Self-pay | Admitting: Cardiovascular Disease

## 2023-02-04 DIAGNOSIS — L300 Nummular dermatitis: Secondary | ICD-10-CM | POA: Diagnosis not present

## 2023-02-06 DIAGNOSIS — E349 Endocrine disorder, unspecified: Secondary | ICD-10-CM | POA: Diagnosis not present

## 2023-02-06 DIAGNOSIS — Z79899 Other long term (current) drug therapy: Secondary | ICD-10-CM | POA: Diagnosis not present

## 2023-02-11 DIAGNOSIS — L03032 Cellulitis of left toe: Secondary | ICD-10-CM | POA: Diagnosis not present

## 2023-02-12 DIAGNOSIS — L03032 Cellulitis of left toe: Secondary | ICD-10-CM | POA: Insufficient documentation

## 2023-02-13 DIAGNOSIS — Z6841 Body Mass Index (BMI) 40.0 and over, adult: Secondary | ICD-10-CM | POA: Diagnosis not present

## 2023-02-13 DIAGNOSIS — N61 Mastitis without abscess: Secondary | ICD-10-CM | POA: Diagnosis not present

## 2023-02-13 DIAGNOSIS — Z9181 History of falling: Secondary | ICD-10-CM | POA: Diagnosis not present

## 2023-02-16 DIAGNOSIS — E782 Mixed hyperlipidemia: Secondary | ICD-10-CM | POA: Diagnosis not present

## 2023-02-16 DIAGNOSIS — E1129 Type 2 diabetes mellitus with other diabetic kidney complication: Secondary | ICD-10-CM | POA: Diagnosis not present

## 2023-02-20 DIAGNOSIS — Z4889 Encounter for other specified surgical aftercare: Secondary | ICD-10-CM | POA: Diagnosis not present

## 2023-02-27 DIAGNOSIS — Z6841 Body Mass Index (BMI) 40.0 and over, adult: Secondary | ICD-10-CM | POA: Diagnosis not present

## 2023-02-27 DIAGNOSIS — N61 Mastitis without abscess: Secondary | ICD-10-CM | POA: Diagnosis not present

## 2023-03-10 DIAGNOSIS — E782 Mixed hyperlipidemia: Secondary | ICD-10-CM | POA: Diagnosis not present

## 2023-03-10 DIAGNOSIS — I10 Essential (primary) hypertension: Secondary | ICD-10-CM | POA: Diagnosis not present

## 2023-03-10 DIAGNOSIS — E1140 Type 2 diabetes mellitus with diabetic neuropathy, unspecified: Secondary | ICD-10-CM | POA: Diagnosis not present

## 2023-03-11 DIAGNOSIS — F603 Borderline personality disorder: Secondary | ICD-10-CM | POA: Diagnosis not present

## 2023-03-14 DIAGNOSIS — E782 Mixed hyperlipidemia: Secondary | ICD-10-CM | POA: Diagnosis not present

## 2023-03-14 DIAGNOSIS — Z6841 Body Mass Index (BMI) 40.0 and over, adult: Secondary | ICD-10-CM | POA: Diagnosis not present

## 2023-03-14 DIAGNOSIS — R0989 Other specified symptoms and signs involving the circulatory and respiratory systems: Secondary | ICD-10-CM | POA: Diagnosis not present

## 2023-03-14 DIAGNOSIS — I10 Essential (primary) hypertension: Secondary | ICD-10-CM | POA: Diagnosis not present

## 2023-03-14 DIAGNOSIS — E1140 Type 2 diabetes mellitus with diabetic neuropathy, unspecified: Secondary | ICD-10-CM | POA: Diagnosis not present

## 2023-03-18 DIAGNOSIS — E291 Testicular hypofunction: Secondary | ICD-10-CM | POA: Diagnosis not present

## 2023-03-18 DIAGNOSIS — Q984 Klinefelter syndrome, unspecified: Secondary | ICD-10-CM | POA: Diagnosis not present

## 2023-03-18 DIAGNOSIS — E1140 Type 2 diabetes mellitus with diabetic neuropathy, unspecified: Secondary | ICD-10-CM | POA: Diagnosis not present

## 2023-03-18 DIAGNOSIS — Z79899 Other long term (current) drug therapy: Secondary | ICD-10-CM | POA: Diagnosis not present

## 2023-03-18 DIAGNOSIS — E782 Mixed hyperlipidemia: Secondary | ICD-10-CM | POA: Diagnosis not present

## 2023-03-19 DIAGNOSIS — E66813 Obesity, class 3: Secondary | ICD-10-CM | POA: Diagnosis not present

## 2023-03-19 DIAGNOSIS — E1142 Type 2 diabetes mellitus with diabetic polyneuropathy: Secondary | ICD-10-CM | POA: Diagnosis not present

## 2023-03-19 DIAGNOSIS — E11610 Type 2 diabetes mellitus with diabetic neuropathic arthropathy: Secondary | ICD-10-CM | POA: Diagnosis not present

## 2023-03-20 DIAGNOSIS — M7742 Metatarsalgia, left foot: Secondary | ICD-10-CM | POA: Diagnosis not present

## 2023-03-25 DIAGNOSIS — R0989 Other specified symptoms and signs involving the circulatory and respiratory systems: Secondary | ICD-10-CM | POA: Diagnosis not present

## 2023-04-04 ENCOUNTER — Other Ambulatory Visit: Payer: Self-pay | Admitting: Student

## 2023-04-08 DIAGNOSIS — Z6841 Body Mass Index (BMI) 40.0 and over, adult: Secondary | ICD-10-CM | POA: Diagnosis not present

## 2023-04-08 DIAGNOSIS — W19XXXA Unspecified fall, initial encounter: Secondary | ICD-10-CM | POA: Diagnosis not present

## 2023-04-08 DIAGNOSIS — R0989 Other specified symptoms and signs involving the circulatory and respiratory systems: Secondary | ICD-10-CM | POA: Diagnosis not present

## 2023-04-17 DIAGNOSIS — M1712 Unilateral primary osteoarthritis, left knee: Secondary | ICD-10-CM | POA: Diagnosis not present

## 2023-04-18 DIAGNOSIS — E1140 Type 2 diabetes mellitus with diabetic neuropathy, unspecified: Secondary | ICD-10-CM | POA: Diagnosis not present

## 2023-04-18 DIAGNOSIS — E782 Mixed hyperlipidemia: Secondary | ICD-10-CM | POA: Diagnosis not present

## 2023-04-22 DIAGNOSIS — E291 Testicular hypofunction: Secondary | ICD-10-CM | POA: Diagnosis not present

## 2023-04-23 DIAGNOSIS — Z6841 Body Mass Index (BMI) 40.0 and over, adult: Secondary | ICD-10-CM | POA: Diagnosis not present

## 2023-04-23 DIAGNOSIS — E66813 Obesity, class 3: Secondary | ICD-10-CM | POA: Diagnosis not present

## 2023-04-23 DIAGNOSIS — R0789 Other chest pain: Secondary | ICD-10-CM | POA: Diagnosis not present

## 2023-05-06 DIAGNOSIS — L851 Acquired keratosis [keratoderma] palmaris et plantaris: Secondary | ICD-10-CM | POA: Insufficient documentation

## 2023-05-06 DIAGNOSIS — E11610 Type 2 diabetes mellitus with diabetic neuropathic arthropathy: Secondary | ICD-10-CM | POA: Diagnosis not present

## 2023-05-06 DIAGNOSIS — Z7985 Long-term (current) use of injectable non-insulin antidiabetic drugs: Secondary | ICD-10-CM | POA: Diagnosis not present

## 2023-05-06 DIAGNOSIS — M79674 Pain in right toe(s): Secondary | ICD-10-CM | POA: Diagnosis not present

## 2023-05-06 DIAGNOSIS — B351 Tinea unguium: Secondary | ICD-10-CM | POA: Diagnosis not present

## 2023-05-06 NOTE — Progress Notes (Unsigned)
Cardiology Office Note:  .   Date:  05/07/2023  ID:  Peter Buck, DOB [birth year], MRN [id number] PCP: Charlott Rakes, MD  [facility] Providers Cardiologist:  Reatha Harps, MD { History of Present Illness: .   Peter Buck is a 67 y.o. male with history of persistent Afib, HTN, HLD, obesity, OSA who presents for follow-up.    History of Present Illness   [name], a 67 year old male with a history of persistent atrial fibrillation, hypertension, hyperlipidemia, and morbid obesity, presents for a routine follow-up. He reports no recent issues such as trouble breathing or chest pains. He underwent a chemical stress test in July, which reportedly showed no abnormalities. His blood pressure is well-controlled, and his diabetes is well-managed with an A1c consistently around 6.0-6.2. His cholesterol levels were perfect in a recent September check. He denies any bruising or bleeding on Eliquis, only bruising when he stumbles into something. He recently started a new compound medication from the pain clinic, which contains baclofen and a 'cane' analgesic. He reports it works well for him. He has stopped taking Lasix due to constipation, but wears compression stockings to manage any leg swelling. He has also [facility] [employer], which he enjoys and finds beneficial. He is considering bariatric surgery due to ongoing struggles with weight management and the need for a knee replacement, which is currently not possible due to his weight. No CP or SOB. No palpitations.          Problem List 1. Persistent Atrial fibrillation -CHADSVASC=5 (age, DM, HTN, TIA) -failed DCCV 10/2019 -MPI normal 10/2019 (breast attenuation) -DCCV 07/05/2020 -recurrence 09/2021 -> TEE/DCCV 10/10/2021 -recurrence 03/11/2022 2. Morbid obesity -BMI 40 3. OSA -CPAP 4. HTN 5. DM -A1c 5.8 6. HLD -T chol 123, HDL 45, LDL 55, TG 128    ROS: All other ROS reviewed and negative. Pertinent  positives noted in the HPI.     Studies Reviewed: Marland Kitchen       Physical Exam:   VS:  BP 112/78 (BP Location: Right Arm, Patient Position: Sitting, Cuff Size: Normal)   Pulse 78   Ht 6\' 4"  (1.93 m)   Wt (!) 339 lb 12.8 oz (154.1 kg)   SpO2 93%   BMI 41.36 kg/m    Wt Readings from Last 3 Encounters:  05/07/23 (!) 339 lb 12.8 oz (154.1 kg)  12/30/22 (!) 329 lb (149.2 kg)  11/05/22 (!) 332 lb 3.2 oz (150.7 kg)    GEN: Well nourished, well developed in no acute distress NECK: No JVD; No carotid bruits CARDIAC: irregular rhythm, no murmurs, rubs, gallops RESPIRATORY:  Clear to auscultation without rales, wheezing or rhonchi  ABDOMEN: Soft, non-tender, non-distended EXTREMITIES:  No edema; No deformity  ASSESSMENT AND PLAN: .   Assessment and Plan    Atrial Fibrillation, persistent  Rate control strategy decided due to obesity and failed multaq  Well controlled on Bystolic. No symptoms of chest pain or shortness of breath. No bruising or bleeding on Eliquis. -Continue Bystolic and Eliquis.  Hypertension Well controlled on Valsartan 80mg  BID and Bystolic 10mg  BID. -Continue Valsartan and Bystolic.  Hyperlipidemia LDL cholesterol is 60 on Crestor 40mg  daily. -Continue Crestor.  Diabetes Well controlled with A1c around 6.0-6.2. -Continue current management.  Morbid Obesity Patient considering bariatric surgery due to continued weight gain and need for knee replacement. -Encourage patient to pursue bariatric surgery evaluation.  Edema Patient has occasional leg swelling but is managing with compression stockings and infrequent  Lasix use. -Continue current management and encourage salt intake monitoring.              Follow-up: Return in about 1 year (around 05/06/2024).  Time Spent with Patient: I have spent a total of 25 minutes caring for this patient today face to face, ordering and reviewing labs/tests, reviewing prior records/medical history, examining the patient,  establishing an assessment and plan, communicating results/findings to the patient/family, and documenting in the medical record.   Signed, Lenna Gilford. Flora Lipps, MD, [facility]  [facility]  [address], [address] [place], [place] [address] [phone]  9:24 AM

## 2023-05-07 ENCOUNTER — Encounter: Payer: Self-pay | Admitting: Cardiovascular Disease

## 2023-05-07 ENCOUNTER — Ambulatory Visit: Payer: Medicare HMO | Attending: Cardiovascular Disease | Admitting: Cardiovascular Disease

## 2023-05-07 VITALS — BP 112/78 | HR 78 | Ht 76.0 in | Wt 339.8 lb

## 2023-05-07 DIAGNOSIS — I150 Renovascular hypertension: Secondary | ICD-10-CM | POA: Diagnosis not present

## 2023-05-07 DIAGNOSIS — I4819 Other persistent atrial fibrillation: Secondary | ICD-10-CM | POA: Diagnosis not present

## 2023-05-07 DIAGNOSIS — E782 Mixed hyperlipidemia: Secondary | ICD-10-CM

## 2023-05-07 NOTE — Patient Instructions (Signed)
Medication Instructions:  NO CHANGES    *If you need a refill on your cardiac medications before your next appointment, please call your pharmacy*   Lab Work:  NONE   If you have labs (blood work) drawn today and your tests are completely normal, you will receive your results only by: MyChart Message (if you have MyChart) OR A paper copy in the mail If you have any lab test that is abnormal or we need to change your treatment, we will call you to review the results.   Testing/Procedures: NONE    Follow-Up: At [facility], you and your health needs are our priority.  As part of our continuing mission to provide you with exceptional heart care, we have created designated Provider Care Teams.  These Care Teams include your primary Cardiologist (physician) and Advanced Practice Providers (APPs -  Physician Assistants and Nurse Practitioners) who all work together to provide you with the care you need, when you need it.  We recommend signing up for the patient portal called "MyChart".  Sign up information is provided on this After Visit Summary.  MyChart is used to connect with patients for Virtual Visits (Telemedicine).  Patients are able to view lab/test results, encounter notes, upcoming appointments, etc.  Non-urgent messages can be sent to your provider as well.   To learn more about what you can do with MyChart, go to [email or web address].    Your next appointment:   1 year(s)  The format for your next appointment:   In Person  Provider:   Edd Fabian, FNP, Micah Flesher, PA-C, Marjie Skiff, PA-C, Robet Leu, PA-C, Rito Ehrlich, or Joni Reining, DNP, ANP       Other Instructions

## 2023-05-09 DIAGNOSIS — G4733 Obstructive sleep apnea (adult) (pediatric): Secondary | ICD-10-CM | POA: Diagnosis not present

## 2023-05-09 DIAGNOSIS — E66813 Obesity, class 3: Secondary | ICD-10-CM | POA: Diagnosis not present

## 2023-05-09 DIAGNOSIS — G4719 Other hypersomnia: Secondary | ICD-10-CM | POA: Diagnosis not present

## 2023-05-09 DIAGNOSIS — Z6841 Body Mass Index (BMI) 40.0 and over, adult: Secondary | ICD-10-CM | POA: Diagnosis not present

## 2023-05-12 DIAGNOSIS — F603 Borderline personality disorder: Secondary | ICD-10-CM | POA: Diagnosis not present

## 2023-05-13 DIAGNOSIS — E11610 Type 2 diabetes mellitus with diabetic neuropathic arthropathy: Secondary | ICD-10-CM | POA: Diagnosis not present

## 2023-05-13 DIAGNOSIS — Z7985 Long-term (current) use of injectable non-insulin antidiabetic drugs: Secondary | ICD-10-CM | POA: Diagnosis not present

## 2023-05-13 DIAGNOSIS — M51362 Other intervertebral disc degeneration, lumbar region with discogenic back pain and lower extremity pain: Secondary | ICD-10-CM | POA: Diagnosis not present

## 2023-05-13 DIAGNOSIS — E66813 Obesity, class 3: Secondary | ICD-10-CM | POA: Diagnosis not present

## 2023-05-13 DIAGNOSIS — R252 Cramp and spasm: Secondary | ICD-10-CM | POA: Diagnosis not present

## 2023-05-14 DIAGNOSIS — M1711 Unilateral primary osteoarthritis, right knee: Secondary | ICD-10-CM | POA: Diagnosis not present

## 2023-05-14 DIAGNOSIS — M1712 Unilateral primary osteoarthritis, left knee: Secondary | ICD-10-CM | POA: Diagnosis not present

## 2023-05-18 DIAGNOSIS — E782 Mixed hyperlipidemia: Secondary | ICD-10-CM | POA: Diagnosis not present

## 2023-05-18 DIAGNOSIS — E1140 Type 2 diabetes mellitus with diabetic neuropathy, unspecified: Secondary | ICD-10-CM | POA: Diagnosis not present

## 2023-05-20 DIAGNOSIS — M14672 Charcot's joint, left ankle and foot: Secondary | ICD-10-CM | POA: Diagnosis not present

## 2023-05-20 DIAGNOSIS — E291 Testicular hypofunction: Secondary | ICD-10-CM | POA: Diagnosis not present

## 2023-05-20 DIAGNOSIS — E1140 Type 2 diabetes mellitus with diabetic neuropathy, unspecified: Secondary | ICD-10-CM | POA: Diagnosis not present

## 2023-06-03 DIAGNOSIS — E11621 Type 2 diabetes mellitus with foot ulcer: Secondary | ICD-10-CM | POA: Diagnosis not present

## 2023-06-03 DIAGNOSIS — M14672 Charcot's joint, left ankle and foot: Secondary | ICD-10-CM | POA: Diagnosis not present

## 2023-06-03 DIAGNOSIS — L97522 Non-pressure chronic ulcer of other part of left foot with fat layer exposed: Secondary | ICD-10-CM | POA: Diagnosis not present

## 2023-06-03 DIAGNOSIS — L97521 Non-pressure chronic ulcer of other part of left foot limited to breakdown of skin: Secondary | ICD-10-CM | POA: Insufficient documentation

## 2023-06-03 DIAGNOSIS — Z7985 Long-term (current) use of injectable non-insulin antidiabetic drugs: Secondary | ICD-10-CM | POA: Diagnosis not present

## 2023-06-05 DIAGNOSIS — M14672 Charcot's joint, left ankle and foot: Secondary | ICD-10-CM | POA: Diagnosis not present

## 2023-06-05 DIAGNOSIS — L97522 Non-pressure chronic ulcer of other part of left foot with fat layer exposed: Secondary | ICD-10-CM | POA: Diagnosis not present

## 2023-06-16 DIAGNOSIS — F319 Bipolar disorder, unspecified: Secondary | ICD-10-CM | POA: Diagnosis not present

## 2023-06-16 DIAGNOSIS — Z6841 Body Mass Index (BMI) 40.0 and over, adult: Secondary | ICD-10-CM | POA: Diagnosis not present

## 2023-06-16 DIAGNOSIS — Z202 Contact with and (suspected) exposure to infections with a predominantly sexual mode of transmission: Secondary | ICD-10-CM | POA: Diagnosis not present

## 2023-06-16 DIAGNOSIS — Z206 Contact with and (suspected) exposure to human immunodeficiency virus [HIV]: Secondary | ICD-10-CM | POA: Diagnosis not present

## 2023-06-17 ENCOUNTER — Other Ambulatory Visit: Payer: Self-pay | Admitting: Cardiovascular Disease

## 2023-06-17 DIAGNOSIS — Z206 Contact with and (suspected) exposure to human immunodeficiency virus [HIV]: Secondary | ICD-10-CM | POA: Diagnosis not present

## 2023-06-17 DIAGNOSIS — Z202 Contact with and (suspected) exposure to infections with a predominantly sexual mode of transmission: Secondary | ICD-10-CM | POA: Diagnosis not present

## 2023-06-17 DIAGNOSIS — I480 Paroxysmal atrial fibrillation: Secondary | ICD-10-CM

## 2023-06-17 DIAGNOSIS — E291 Testicular hypofunction: Secondary | ICD-10-CM | POA: Diagnosis not present

## 2023-06-17 NOTE — Telephone Encounter (Signed)
 Eliquis  5mg  refill request received. Patient is 67 years old, weight-154.1kg, Crea-0.91 on 03/18/23 via [employer] tab from [place], Diagnosis-Afib, and last seen by Dr. Barbaraann on 05/07/23. Dose is appropriate based on dosing criteria. Will send in refill to requested pharmacy.

## 2023-06-19 DIAGNOSIS — Z6841 Body Mass Index (BMI) 40.0 and over, adult: Secondary | ICD-10-CM | POA: Diagnosis not present

## 2023-06-19 DIAGNOSIS — E1140 Type 2 diabetes mellitus with diabetic neuropathy, unspecified: Secondary | ICD-10-CM | POA: Diagnosis not present

## 2023-06-19 DIAGNOSIS — M48061 Spinal stenosis, lumbar region without neurogenic claudication: Secondary | ICD-10-CM | POA: Diagnosis not present

## 2023-06-19 DIAGNOSIS — R262 Difficulty in walking, not elsewhere classified: Secondary | ICD-10-CM | POA: Diagnosis not present

## 2023-07-07 DIAGNOSIS — Z6841 Body Mass Index (BMI) 40.0 and over, adult: Secondary | ICD-10-CM | POA: Diagnosis not present

## 2023-07-07 DIAGNOSIS — T148XXA Other injury of unspecified body region, initial encounter: Secondary | ICD-10-CM | POA: Diagnosis not present

## 2023-07-08 DIAGNOSIS — F603 Borderline personality disorder: Secondary | ICD-10-CM | POA: Diagnosis not present

## 2023-07-11 DIAGNOSIS — M1711 Unilateral primary osteoarthritis, right knee: Secondary | ICD-10-CM | POA: Diagnosis not present

## 2023-07-21 DIAGNOSIS — E291 Testicular hypofunction: Secondary | ICD-10-CM | POA: Diagnosis not present

## 2023-07-22 DIAGNOSIS — F319 Bipolar disorder, unspecified: Secondary | ICD-10-CM | POA: Diagnosis not present

## 2023-08-05 DIAGNOSIS — F603 Borderline personality disorder: Secondary | ICD-10-CM | POA: Diagnosis not present

## 2023-08-07 DIAGNOSIS — S0340XA Sprain of jaw, unspecified side, initial encounter: Secondary | ICD-10-CM | POA: Diagnosis not present

## 2023-08-07 DIAGNOSIS — Z6841 Body Mass Index (BMI) 40.0 and over, adult: Secondary | ICD-10-CM | POA: Diagnosis not present

## 2023-08-09 ENCOUNTER — Emergency Department (HOSPITAL_BASED_OUTPATIENT_CLINIC_OR_DEPARTMENT_OTHER): Payer: Medicare HMO

## 2023-08-09 ENCOUNTER — Emergency Department (HOSPITAL_BASED_OUTPATIENT_CLINIC_OR_DEPARTMENT_OTHER)
Admission: EM | Admit: 2023-08-09 | Discharge: 2023-08-10 | Disposition: A | Discharge status: Home / Self Care | Payer: Medicare HMO | Attending: Emergency Medicine | Admitting: Emergency Medicine

## 2023-08-09 ENCOUNTER — Encounter (HOSPITAL_BASED_OUTPATIENT_CLINIC_OR_DEPARTMENT_OTHER): Payer: Self-pay | Admitting: Emergency Medicine

## 2023-08-09 DIAGNOSIS — W010XXA Fall on same level from slipping, tripping and stumbling without subsequent striking against object, initial encounter: Secondary | ICD-10-CM | POA: Insufficient documentation

## 2023-08-09 DIAGNOSIS — R079 Chest pain, unspecified: Secondary | ICD-10-CM | POA: Diagnosis not present

## 2023-08-09 DIAGNOSIS — Z7901 Long term (current) use of anticoagulants: Secondary | ICD-10-CM | POA: Diagnosis not present

## 2023-08-09 DIAGNOSIS — I4891 Unspecified atrial fibrillation: Secondary | ICD-10-CM | POA: Diagnosis not present

## 2023-08-09 DIAGNOSIS — M7989 Other specified soft tissue disorders: Secondary | ICD-10-CM | POA: Diagnosis not present

## 2023-08-09 DIAGNOSIS — L03115 Cellulitis of right lower limb: Secondary | ICD-10-CM | POA: Insufficient documentation

## 2023-08-09 DIAGNOSIS — R9431 Abnormal electrocardiogram [ECG] [EKG]: Secondary | ICD-10-CM | POA: Diagnosis not present

## 2023-08-09 DIAGNOSIS — I249 Acute ischemic heart disease, unspecified: Secondary | ICD-10-CM | POA: Diagnosis not present

## 2023-08-09 DIAGNOSIS — M1711 Unilateral primary osteoarthritis, right knee: Secondary | ICD-10-CM | POA: Diagnosis not present

## 2023-08-09 DIAGNOSIS — M79661 Pain in right lower leg: Secondary | ICD-10-CM | POA: Diagnosis present

## 2023-08-09 LAB — CBC WITH DIFFERENTIAL/PLATELET
Abs Immature Granulocytes: 0.06 10*3/uL (ref 0.00–0.07)
Basophils Absolute: 0.1 10*3/uL (ref 0.0–0.1)
Basophils Relative: 0 %
Eosinophils Absolute: 0.3 10*3/uL (ref 0.0–0.5)
Eosinophils Relative: 2 %
HCT: 51.9 % (ref 39.0–52.0)
Hemoglobin: 16.8 g/dL (ref 13.0–17.0)
Immature Granulocytes: 1 %
Lymphocytes Relative: 19 %
Lymphs Abs: 2.3 10*3/uL (ref 0.7–4.0)
MCH: 30 pg (ref 26.0–34.0)
MCHC: 32.4 g/dL (ref 30.0–36.0)
MCV: 92.7 fL (ref 80.0–100.0)
Monocytes Absolute: 1 10*3/uL (ref 0.1–1.0)
Monocytes Relative: 8 %
Neutro Abs: 8.3 10*3/uL — ABNORMAL HIGH (ref 1.7–7.7)
Neutrophils Relative %: 70 %
Platelets: 200 10*3/uL (ref 150–400)
RBC: 5.6 MIL/uL (ref 4.22–5.81)
RDW: 14.4 % (ref 11.5–15.5)
WBC: 11.9 10*3/uL — ABNORMAL HIGH (ref 4.0–10.5)
nRBC: 0 % (ref 0.0–0.2)

## 2023-08-09 NOTE — ED Provider Notes (Signed)
 [place] EMERGENCY DEPARTMENT AT [facility] Provider Note   CSN: [id number] Arrival date & time: 08/09/23  1933     History {Add pertinent medical, surgical, social history, OB history to HPI:1} Chief Complaint  Patient presents with   Leg Pain    Peter Buck is a 68 y.o. male.  Patient presents to the emergency department for evaluation of right lower leg pain.  Patient reports that he tripped 1 week ago and fell, injuring the lower leg.  Patient reports pain from the knee down to the ankle.  He reports that he had some prednisone and took it to help with the pain, but tonight he started having sharp pains in the leg so presented to the ED.       Home Medications Prior to Admission medications   Medication Sig Start Date End Date Taking? Authorizing Provider  acetaminophen (TYLENOL) 650 MG CR tablet Take by mouth.    [provider]  apixaban Everlene Balls) 5 MG TABS tablet Take 1 tablet by mouth twice daily 06/17/23   O'Neal, Ronnald Ramp, MD  betamethasone dipropionate 0.05 % cream Apply 1 application. topically daily as needed (itching).    [provider]  Calcium Citrate-Vitamin D (CALCIUM CITRATE + D3 PO) Take 1 tablet by mouth at bedtime. 630 mg /12.5 mcg    [provider]  celecoxib (CELEBREX) 200 MG capsule Take 200 mg by mouth 2 (two) times daily. 11/15/19   [provider]  diclofenac Sodium (VOLTAREN) 1 % GEL Apply 1 g topically 4 (four) times daily as needed (pain).    [provider]  DULoxetine (CYMBALTA) 30 MG capsule Take by mouth.    [provider]  EPINEPHrine 0.3 mg/0.3 mL IJ SOAJ injection Inject into the muscle.    [provider]  Fexofenadine HCl (MUCINEX ALLERGY PO) Take by mouth daily as needed.    [provider]  FIBER ADULT GUMMIES PO Take 10 g by mouth daily.    [provider]  furosemide (LASIX) 40 MG tablet Take 1 tablet (40 mg total) by mouth  daily. Patient not taking: Reported on 05/07/2023 04/04/23   Joylene Grapes, NP  Horse Chestnut 300 MG CAPS Take 300 mg by mouth in the morning and at bedtime.    [provider]  lubiprostone (AMITIZA) 24 MCG capsule 1 capsule with food and water Orally Twice a day for 30 day(s) Patient not taking: Reported on 05/07/2023    [provider]  Magnesium Citrate 200 MG TABS Take 200 mg by mouth daily. Gummies    [provider]  mometasone (NASONEX) 50 MCG/ACT nasal spray Place 1 spray into the nose daily.    [provider]  nebivolol (BYSTOLIC) 10 MG tablet Take 1 tablet by mouth twice daily 02/03/23   Bernadene Person C, NP  ondansetron (ZOFRAN-ODT) 4 MG disintegrating tablet DISSOLVE 1 TABLET IN MOUTH EVERY 8 HOURS AS NEEDED    [provider]  Oxcarbazepine (TRILEPTAL) 300 MG tablet Take 300 mg by mouth 2 (two) times daily. 03/13/20   [provider]  OZEMPIC, 1 MG/DOSE, 4 MG/3ML SOPN Inject 1 mg into the muscle once a week. INJECT THE CONTENTS OF 1 PEN INJECTOR SUBQ ONCE WEEKLY 02/08/20   [provider]  OZEMPIC, 2 MG/DOSE, 8 MG/3ML SOPN Inject 2 mg into the skin once a week. 12/14/22   [provider]  pramoxine (PROCTOFOAM) 1 % foam as directed Rectal Patient not taking: Reported on 05/07/2023  [provider]  RELISTOR 150 MG TABS Take 3 tablets by mouth daily. Patient not taking: Reported on 05/07/2023 09/29/20   [provider]  rosuvastatin (CRESTOR) 40 MG tablet Take 1 tablet (40 mg total) by mouth daily. 11/05/22   Marjie Skiff E, PA-C  testosterone cypionate (DEPOTESTOSTERONE CYPIONATE) 200 MG/ML injection Inject 200 mg into the muscle every 21 ( twenty-one) days. 10/22/19   [provider]  tiZANidine (ZANAFLEX) 4 MG tablet Take 4 mg by mouth every 6 (six) hours as needed for muscle spasms.    [provider]  valsartan (DIOVAN) 80 MG tablet Take 1 tablet (80 mg total) by mouth See  admin instructions. Take 80 mg in the morning and 160 mg at bedtime Patient taking differently: Take 80 mg by mouth See admin instructions. Take 80 mg Twice Daily 03/11/22   Ronney Asters, NP      Allergies    Augmentin [amoxicillin-pot clavulanate], Fish allergy, Iodinated contrast media, Ketorolac, Toradol [ketorolac tromethamine], Atorvastatin, Guaifenesin, Penicillins, Statins, Alendronate sodium, Dextromethorphan, Lovastatin, Pine tar, and Pseudoephedrine    Review of Systems   Review of Systems  Physical Exam Updated Vital Signs BP (!) 152/97 (BP Location: Right Arm)   Pulse 86   Temp 97.6 F (36.4 C)   Resp 17   SpO2 95%  Physical Exam Vitals and nursing note reviewed.  Constitutional:      General: He is not in acute distress.    Appearance: He is well-developed.  HENT:     Head: Normocephalic and atraumatic.     Mouth/Throat:     Mouth: Mucous membranes are moist.  Eyes:     General: Vision grossly intact. Gaze aligned appropriately.     Extraocular Movements: Extraocular movements intact.     Conjunctiva/sclera: Conjunctivae normal.  Cardiovascular:     Rate and Rhythm: Normal rate and regular rhythm.     Pulses: Normal pulses.          Dorsalis pedis pulses are 2+ on the right side and 2+ on the left side.     Heart sounds: Normal heart sounds, S1 normal and S2 normal. No murmur heard.    No friction rub. No gallop.  Pulmonary:     Effort: Pulmonary effort is normal. No respiratory distress.     Breath sounds: Normal breath sounds.  Abdominal:     Palpations: Abdomen is soft.     Tenderness: There is no abdominal tenderness. There is no guarding or rebound.     Hernia: No hernia is present.  Musculoskeletal:        General: No swelling.     Cervical back: Full passive range of motion without pain, normal range of motion and neck supple. No pain with movement, spinous process tenderness or muscular tenderness. Normal range of motion.     Right lower leg:  Edema present.     Left lower leg: Edema present.  Skin:    General: Skin is warm and dry.     Capillary Refill: Capillary refill takes less than 2 seconds.     Findings: Erythema (Right lower extremity) and wound (Small wounds mid shin) present. No ecchymosis or lesion.  Neurological:     Mental Status: He is alert and oriented to person, place, and time.     GCS: GCS eye subscore is 4. GCS verbal subscore is 5. GCS motor subscore is 6.     Cranial Nerves: Cranial nerves 2-12 are intact.     Sensory:  Sensation is intact.     Motor: Motor function is intact. No weakness or abnormal muscle tone.     Coordination: Coordination is intact.  Psychiatric:        Mood and Affect: Mood normal.        Speech: Speech normal.        Behavior: Behavior normal.     ED Results / Procedures / Treatments   Labs (all labs ordered are listed, but only abnormal results are displayed) Labs Reviewed  CBC WITH DIFFERENTIAL/PLATELET  BASIC METABOLIC PANEL    EKG None  Radiology No results found.  Procedures Procedures  {Document cardiac monitor, telemetry assessment procedure when appropriate:1}  Medications Ordered in ED Medications - No data to display  ED Course/ Medical Decision Making/ A&P   {   Click here for ABCD2, HEART and other calculatorsREFRESH Note before signing :1}                              Medical Decision Making Amount and/or Complexity of Data Reviewed Labs: ordered. Radiology: ordered.   ***  {Document critical care time when appropriate:1} {Document review of labs and clinical decision tools ie heart score, Chads2Vasc2 etc:1}  {Document your independent review of radiology images, and any outside records:1} {Document your discussion with family members, caretakers, and with consultants:1} {Document social determinants of health affecting pt's care:1} {Document your decision making why or why not admission, treatments were needed:1} Final Clinical  Impression(s) / ED Diagnoses Final diagnoses:  None    Rx / DC Orders ED Discharge Orders     None

## 2023-08-09 NOTE — ED Triage Notes (Addendum)
 Fall last week, trip over a blanket. Right knee to ankle Pain not relieved with home pain meds Steroid helped some.  Ambulatory to triage

## 2023-08-10 DIAGNOSIS — I10 Essential (primary) hypertension: Secondary | ICD-10-CM | POA: Diagnosis not present

## 2023-08-10 DIAGNOSIS — E1169 Type 2 diabetes mellitus with other specified complication: Secondary | ICD-10-CM | POA: Diagnosis not present

## 2023-08-10 DIAGNOSIS — I4891 Unspecified atrial fibrillation: Secondary | ICD-10-CM | POA: Diagnosis not present

## 2023-08-10 DIAGNOSIS — M14679 Charcot's joint, unspecified ankle and foot: Secondary | ICD-10-CM | POA: Diagnosis not present

## 2023-08-10 DIAGNOSIS — I249 Acute ischemic heart disease, unspecified: Secondary | ICD-10-CM | POA: Diagnosis not present

## 2023-08-10 DIAGNOSIS — L03115 Cellulitis of right lower limb: Secondary | ICD-10-CM | POA: Diagnosis not present

## 2023-08-10 DIAGNOSIS — R079 Chest pain, unspecified: Secondary | ICD-10-CM | POA: Diagnosis not present

## 2023-08-10 DIAGNOSIS — R9431 Abnormal electrocardiogram [ECG] [EKG]: Secondary | ICD-10-CM | POA: Diagnosis not present

## 2023-08-10 DIAGNOSIS — E785 Hyperlipidemia, unspecified: Secondary | ICD-10-CM | POA: Diagnosis not present

## 2023-08-10 LAB — BASIC METABOLIC PANEL
Anion gap: 5 (ref 5–15)
BUN: 23 mg/dL (ref 8–23)
CO2: 35 mmol/L — ABNORMAL HIGH (ref 22–32)
Calcium: 10.1 mg/dL (ref 8.9–10.3)
Chloride: 97 mmol/L — ABNORMAL LOW (ref 98–111)
Creatinine, Ser: 1.04 mg/dL (ref 0.61–1.24)
GFR, Estimated: >60 mL/min (ref >60)
Glucose, Bld: 117 mg/dL — ABNORMAL HIGH (ref 70–99)
Potassium: 4.8 mmol/L (ref 3.5–5.1)
Sodium: 137 mmol/L (ref 135–145)

## 2023-08-10 MED ORDER — OXYCODONE-ACETAMINOPHEN 5-325 MG PO TABS
1.0000 | ORAL_TABLET | Freq: Once | ORAL | Status: AC
  Administered 2023-08-10: 1 via ORAL
  Filled 2023-08-10: qty 1

## 2023-08-10 MED ORDER — CLINDAMYCIN HCL 150 MG PO CAPS: 300.0000 mg | ORAL_CAPSULE | Freq: Four times a day (QID) | ORAL | 0 refills | Status: DC

## 2023-08-10 MED ORDER — CLINDAMYCIN HCL 150 MG PO CAPS
300.0000 mg | ORAL_CAPSULE | Freq: Once | ORAL | Status: AC
  Administered 2023-08-10: 300 mg via ORAL
  Filled 2023-08-10: qty 2

## 2023-08-10 MED ORDER — OXYCODONE-ACETAMINOPHEN 5-325 MG PO TABS: 1.0000 | ORAL_TABLET | ORAL | 0 refills | Status: AC | PRN

## 2023-08-11 DIAGNOSIS — E785 Hyperlipidemia, unspecified: Secondary | ICD-10-CM | POA: Diagnosis not present

## 2023-08-11 DIAGNOSIS — I4891 Unspecified atrial fibrillation: Secondary | ICD-10-CM | POA: Diagnosis not present

## 2023-08-11 DIAGNOSIS — I10 Essential (primary) hypertension: Secondary | ICD-10-CM | POA: Diagnosis not present

## 2023-08-11 DIAGNOSIS — E1169 Type 2 diabetes mellitus with other specified complication: Secondary | ICD-10-CM | POA: Diagnosis not present

## 2023-08-11 DIAGNOSIS — R079 Chest pain, unspecified: Secondary | ICD-10-CM | POA: Diagnosis not present

## 2023-08-11 DIAGNOSIS — M14679 Charcot's joint, unspecified ankle and foot: Secondary | ICD-10-CM | POA: Diagnosis not present

## 2023-08-12 DIAGNOSIS — F319 Bipolar disorder, unspecified: Secondary | ICD-10-CM | POA: Diagnosis not present

## 2023-08-18 DIAGNOSIS — Z6839 Body mass index (BMI) 39.0-39.9, adult: Secondary | ICD-10-CM | POA: Diagnosis not present

## 2023-08-18 DIAGNOSIS — Z7689 Persons encountering health services in other specified circumstances: Secondary | ICD-10-CM | POA: Diagnosis not present

## 2023-08-18 DIAGNOSIS — L03115 Cellulitis of right lower limb: Secondary | ICD-10-CM | POA: Diagnosis not present

## 2023-08-18 DIAGNOSIS — E291 Testicular hypofunction: Secondary | ICD-10-CM | POA: Diagnosis not present

## 2023-08-19 DIAGNOSIS — L03115 Cellulitis of right lower limb: Secondary | ICD-10-CM | POA: Insufficient documentation

## 2023-08-19 DIAGNOSIS — L97521 Non-pressure chronic ulcer of other part of left foot limited to breakdown of skin: Secondary | ICD-10-CM | POA: Diagnosis not present

## 2023-08-20 DIAGNOSIS — E119 Type 2 diabetes mellitus without complications: Secondary | ICD-10-CM | POA: Diagnosis not present

## 2023-08-20 DIAGNOSIS — Z886 Allergy status to analgesic agent status: Secondary | ICD-10-CM | POA: Diagnosis not present

## 2023-08-20 DIAGNOSIS — T368X5A Adverse effect of other systemic antibiotics, initial encounter: Secondary | ICD-10-CM | POA: Diagnosis not present

## 2023-08-20 DIAGNOSIS — Z888 Allergy status to other drugs, medicaments and biological substances status: Secondary | ICD-10-CM | POA: Diagnosis not present

## 2023-08-20 DIAGNOSIS — I480 Paroxysmal atrial fibrillation: Secondary | ICD-10-CM | POA: Diagnosis not present

## 2023-08-20 DIAGNOSIS — Z7985 Long-term (current) use of injectable non-insulin antidiabetic drugs: Secondary | ICD-10-CM | POA: Diagnosis not present

## 2023-08-20 DIAGNOSIS — Z880 Allergy status to penicillin: Secondary | ICD-10-CM | POA: Diagnosis not present

## 2023-08-20 DIAGNOSIS — F319 Bipolar disorder, unspecified: Secondary | ICD-10-CM | POA: Diagnosis not present

## 2023-08-20 DIAGNOSIS — M7989 Other specified soft tissue disorders: Secondary | ICD-10-CM | POA: Diagnosis not present

## 2023-08-20 DIAGNOSIS — N179 Acute kidney failure, unspecified: Secondary | ICD-10-CM | POA: Diagnosis not present

## 2023-08-20 DIAGNOSIS — Z91041 Radiographic dye allergy status: Secondary | ICD-10-CM | POA: Diagnosis not present

## 2023-08-20 DIAGNOSIS — Z7982 Long term (current) use of aspirin: Secondary | ICD-10-CM | POA: Diagnosis not present

## 2023-08-20 DIAGNOSIS — G8929 Other chronic pain: Secondary | ICD-10-CM | POA: Diagnosis not present

## 2023-08-20 DIAGNOSIS — M1990 Unspecified osteoarthritis, unspecified site: Secondary | ICD-10-CM | POA: Diagnosis not present

## 2023-08-20 DIAGNOSIS — E785 Hyperlipidemia, unspecified: Secondary | ICD-10-CM | POA: Diagnosis not present

## 2023-08-20 DIAGNOSIS — R600 Localized edema: Secondary | ICD-10-CM | POA: Diagnosis not present

## 2023-08-20 DIAGNOSIS — I10 Essential (primary) hypertension: Secondary | ICD-10-CM | POA: Diagnosis not present

## 2023-08-20 DIAGNOSIS — I4891 Unspecified atrial fibrillation: Secondary | ICD-10-CM | POA: Diagnosis not present

## 2023-08-20 DIAGNOSIS — Z7901 Long term (current) use of anticoagulants: Secondary | ICD-10-CM | POA: Diagnosis not present

## 2023-08-20 DIAGNOSIS — L03115 Cellulitis of right lower limb: Secondary | ICD-10-CM | POA: Diagnosis not present

## 2023-08-20 DIAGNOSIS — Z87442 Personal history of urinary calculi: Secondary | ICD-10-CM | POA: Diagnosis not present

## 2023-08-20 DIAGNOSIS — M79661 Pain in right lower leg: Secondary | ICD-10-CM | POA: Diagnosis not present

## 2023-08-20 DIAGNOSIS — Z791 Long term (current) use of non-steroidal anti-inflammatories (NSAID): Secondary | ICD-10-CM | POA: Diagnosis not present

## 2023-08-20 DIAGNOSIS — J45909 Unspecified asthma, uncomplicated: Secondary | ICD-10-CM | POA: Diagnosis not present

## 2023-08-20 DIAGNOSIS — Z79899 Other long term (current) drug therapy: Secondary | ICD-10-CM | POA: Diagnosis not present

## 2023-08-20 DIAGNOSIS — G4733 Obstructive sleep apnea (adult) (pediatric): Secondary | ICD-10-CM | POA: Diagnosis not present

## 2023-08-26 DIAGNOSIS — I4891 Unspecified atrial fibrillation: Secondary | ICD-10-CM | POA: Diagnosis not present

## 2023-08-29 ENCOUNTER — Other Ambulatory Visit: Payer: Self-pay | Admitting: Cardiovascular Disease

## 2023-09-02 DIAGNOSIS — L03115 Cellulitis of right lower limb: Secondary | ICD-10-CM | POA: Diagnosis not present

## 2023-09-02 DIAGNOSIS — M7121 Synovial cyst of popliteal space [Baker], right knee: Secondary | ICD-10-CM | POA: Diagnosis not present

## 2023-09-02 DIAGNOSIS — F603 Borderline personality disorder: Secondary | ICD-10-CM | POA: Diagnosis not present

## 2023-09-02 DIAGNOSIS — N289 Disorder of kidney and ureter, unspecified: Secondary | ICD-10-CM | POA: Diagnosis not present

## 2023-09-02 DIAGNOSIS — Z6839 Body mass index (BMI) 39.0-39.9, adult: Secondary | ICD-10-CM | POA: Diagnosis not present

## 2023-09-02 DIAGNOSIS — Z789 Other specified health status: Secondary | ICD-10-CM | POA: Diagnosis not present

## 2023-09-04 ENCOUNTER — Ambulatory Visit: Payer: Medicare HMO | Attending: Emergency Medicine | Admitting: Emergency Medicine

## 2023-09-04 ENCOUNTER — Encounter: Payer: Self-pay | Admitting: Emergency Medicine

## 2023-09-04 VITALS — BP 148/88 | HR 97 | Ht 76.0 in | Wt 333.0 lb

## 2023-09-04 DIAGNOSIS — R072 Precordial pain: Secondary | ICD-10-CM

## 2023-09-04 DIAGNOSIS — I4819 Other persistent atrial fibrillation: Secondary | ICD-10-CM | POA: Diagnosis not present

## 2023-09-04 DIAGNOSIS — E782 Mixed hyperlipidemia: Secondary | ICD-10-CM | POA: Diagnosis not present

## 2023-09-04 DIAGNOSIS — I10 Essential (primary) hypertension: Secondary | ICD-10-CM

## 2023-09-04 DIAGNOSIS — L03116 Cellulitis of left lower limb: Secondary | ICD-10-CM

## 2023-09-04 DIAGNOSIS — E1142 Type 2 diabetes mellitus with diabetic polyneuropathy: Secondary | ICD-10-CM | POA: Diagnosis not present

## 2023-09-04 DIAGNOSIS — N179 Acute kidney failure, unspecified: Secondary | ICD-10-CM

## 2023-09-04 LAB — BASIC METABOLIC PANEL
BUN/Creatinine Ratio: 14 (ref 10–24)
BUN: 20 mg/dL (ref 8–27)
CO2: 28 mmol/L (ref 20–29)
Calcium: 10.1 mg/dL (ref 8.6–10.2)
Chloride: 103 mmol/L (ref 96–106)
Creatinine, Ser: 1.4 mg/dL — ABNORMAL HIGH (ref 0.76–1.27)
Glucose: 99 mg/dL (ref 70–99)
Potassium: 4.5 mmol/L (ref 3.5–5.2)
Sodium: 143 mmol/L (ref 134–144)
eGFR: 54 mL/min/{1.73_m2} — ABNORMAL LOW (ref >59)

## 2023-09-04 NOTE — Progress Notes (Signed)
 Cardiology Office Note:    Date:  09/04/2023  ID:  Peter Buck, DOB January 27, 1956, MRN [id number] PCP: Charlott Rakes, MD (Inactive)  [facility] Providers Cardiologist:  Reatha Harps, MD       Patient Profile:      Chief Complaint: Hospital follow-up for chest pain  History of Present Illness:  Peter Buck is a 68 y.o. male with visit-pertinent history of atypical chest pain, persistent atrial fibrillation, hypertension, hyperlipidemia, type 2 diabetes, OSA on CPAP, chronic pain syndrome, bipolar disorder, and obesity  He has history of persistent atrial fibrillation and has undergone multiple cardioversions in the past (most recently in 09/2021).  He has failed Multaq therapy.  Echocardiogram in 2021 showed LVEF 55 to 60%.  Myoview in 2021 showed no evidence of ischemia. Echocardiogram in 09/2021 showed LVEF 55-60%, normal LV function, no RWMA, normal RV, no significant valvular abnormalities.  In October 2023, rate control strategy was recommended for his atrial fibrillation.  He was not felt to be a candidate for ablation given morbid obesity.  In 12/2022 he was hospitalized at [facility] in the setting of chest pain.  Troponins were negative.  Lexiscan Myoview was negative for ischemia.  He was last seen in clinic on 05/07/2023. He had recently started a new compound medication from the pain clinic, which contains baclofen and a 'cane' analgesic. He reports it works well for him. He was doing well from a cardiac standpoint.  No medication changes were made and he was to follow-up in 1 year.   Discussed the use of AI scribe software for clinical note transcription with the patient, who gave verbal consent to proceed.    Today, patient presents for follow-up after recent hospitalization for cellulitis and chest pain at [facility].  Unfortunately there is no documentation to review at this time on his recent hospitalization.  He notes he was discharged from  [facility] 1 week ago and was admitted for 9 days.  He notes he developed chest pain which awoke him from sleep which had brought him to the ED.  The chest pain was short lasting and has since resolved entirely.  He notes he was primarily admitted for cellulitis when the ED physician performed a physical exam. He had a Lexiscan Myoview during admission that was negative for ischemia and low risk.  He does tell me that during his admission for treatment of cellulitis he was given IV vancomycin which caused him to have an acute kidney injury.  Subsequently his rosuvastatin and valsartan were held at discharge.  He followed up with his general practitioner yesterday who had started him on doxycycline for his lower leg cellulitis.  At this time I do not have access to his lab work from his hospitalization however he tells me that his creatinine levels increased to 1.7 during hospitalization, but stabilized at 1.7 for three days before discharge.     Review of systems:  Please see the history of present illness. All other systems are reviewed and otherwise negative.     Home Medications:    Current Meds  Medication Sig   acetaminophen (TYLENOL) 650 MG CR tablet Take by mouth.   apixaban (ELIQUIS) 5 MG TABS tablet Take 1 tablet by mouth twice daily   aspirin EC 81 MG tablet Take 81 mg by mouth daily.   betamethasone dipropionate 0.05 % cream Apply 1 application. topically daily as needed (itching).   Calcium Citrate-Vitamin D (CALCIUM CITRATE + D3 PO) Take 1  tablet by mouth at bedtime. 630 mg /12.5 mcg   clindamycin (CLEOCIN) 150 MG capsule Take 2 capsules (300 mg total) by mouth 4 (four) times daily.   Fexofenadine HCl (MUCINEX ALLERGY PO) Take by mouth daily as needed.   FIBER ADULT GUMMIES PO Take 10 g by mouth daily.   Horse Chestnut 300 MG CAPS Take 300 mg by mouth in the morning and at bedtime.   lubiprostone (AMITIZA) 24 MCG capsule    Magnesium Citrate 200 MG TABS Take 200 mg by  mouth daily. Gummies   mometasone (NASONEX) 50 MCG/ACT nasal spray Place 1 spray into the nose daily.   nebivolol (BYSTOLIC) 10 MG tablet Take 1 tablet by mouth twice daily   nitroGLYCERIN (NITROSTAT) 0.4 MG SL tablet Place 0.4 mg under the tongue every 5 (five) minutes as needed.   ondansetron (ZOFRAN-ODT) 4 MG disintegrating tablet DISSOLVE 1 TABLET IN MOUTH EVERY 8 HOURS AS NEEDED   Oxcarbazepine (TRILEPTAL) 300 MG tablet Take 300 mg by mouth 2 (two) times daily.   oxyCODONE-acetaminophen (PERCOCET) 5-325 MG tablet Take 1-2 tablets by mouth every 4 (four) hours as needed.   OZEMPIC, 2 MG/DOSE, 8 MG/3ML SOPN Inject 2 mg into the skin once a week.   testosterone cypionate (DEPOTESTOSTERONE CYPIONATE) 200 MG/ML injection Inject 200 mg into the muscle every 21 ( twenty-one) days.   Studies Reviewed:   EKG Interpretation Date/Time:  Thursday September 04 2023 09:34:24 EDT Ventricular Rate:  97 PR Interval:    QRS Duration:  92 QT Interval:  326 QTC Calculation: 414 R Axis:   51  Text Interpretation: Atrial fibrillation When compared with ECG of 30-Dec-2022 14:36, No significant change was found Confirmed by Rise Paganini [phone]) on 09/04/2023 3:02:25 PM    Lexiscan Myoview 08/11/2023 No evidence of ischemia  TEE 10/10/2021 1. Left ventricular ejection fraction, by estimation, is 55 to 60%. The  left ventricle has normal function. The left ventricle has no regional  wall motion abnormalities.   2. Right ventricular systolic function is normal. The right ventricular  size is mildly enlarged.   3. Left atrial size was mildly dilated. No left atrial/left atrial  appendage thrombus was detected.   4. Right atrial size was mildly dilated.   5. The mitral valve is normal in structure. Trivial mitral valve  regurgitation.   6. The aortic valve is normal in structure. Aortic valve regurgitation is  not visualized.   Risk Assessment/Calculations:    CHA2DS2-VASc Score = 5   This indicates  a 7.2% annual risk of stroke. The patient's score is based upon: CHF History: 0 HTN History: 1 Diabetes History: 1 Stroke History: 2 Vascular Disease History: 0 Age Score: 1 Gender Score: 0    HYPERTENSION CONTROL Vitals:   09/04/23 0924 09/04/23 1035  BP: (!) 156/94 (!) 148/88    The patient's blood pressure is elevated above target today.  In order to address the patient's elevated BP: Labs and/or other diagnostics are currently pending prior to making blood pressure medication adjustments.          Physical Exam:   VS:  BP (!) 148/88 (BP Location: Right Arm, Patient Position: Sitting, Cuff Size: Large)   Pulse 97   Ht 6\' 4"  (1.93 m)   Wt (!) 333 lb (151 kg)   SpO2 95%   BMI 40.53 kg/m    Wt Readings from Last 3 Encounters:  09/04/23 (!) 333 lb (151 kg)  05/07/23 (!) 339 lb 12.8 oz (154.1 kg)  12/30/22 (!) 329 lb (149.2 kg)    GEN: Well nourished, well developed in no acute distress NECK: No JVD; No carotid bruits CARDIAC: Irregularly irregular rhythm, no murmurs, rubs, gallops RESPIRATORY:  Clear to auscultation without rales, wheezing or rhonchi  ABDOMEN: Soft, non-tender, non-distended EXTREMITIES: 1+ bilateral lower extremity edema.  Right leg cellulitis with redness.  Area of demarcation marked by PCP; No acute deformity     Assessment and Plan:  Chest pain During recent admission at [facility] he had noted to have chest pains.  His chest pain was left-sided, lasted a few hours, with no radiation of pain.  He denied any alleviating or aggravating factors. Lexiscan Myoview performed during his admission showed no evidence of ischemia -Today he is without any anginal symptoms, no indication for further ischemic evaluation at this time -Not on ASA as he is currently on OAC  Acute kidney injury Patient notes during recent admission for lower leg cellulitis he had received IV vancomycin which had caused an AKI.  Unfortunately we do not have lab work from  recent admission at [facility].  His rosuvastatin and valsartan were subsequently held at discharge -Repeat BMET today  Persistent atrial fibrillation Rate control strategy was decided due to obesity and failed Multaq Rate is well-controlled on Bystolic EKG today shows atrial fibrillation with heart rate 97 bpm -Today he is overall asymptomatic without complaints of tachycardia -Continue Bystolic 10 mg twice daily -Continue Eliquis 5 mg twice daily, no bleeding concerns  Hypertension Blood pressure today 156/94 and repeat 148/88 Blood pressure currently not at goal of less than 130/80 His valsartan has been held over the last week due to vancomycin induced AKI.  Unfortunately no recent lab work available as noted above. -Will update BMET today with goal to start him back on Valsartan 80 mg twice daily if kidney function has improved.  If kidney function does however remain abnormal can consider amlodipine at that time -Recommend daily blood pressure checks at home -Continue Bystolic 10 mg twice daily  Hyperlipidemia LDL 60 on 02/2023 Rosuvastatin 40 mg recently held in the setting of AKI -Will repeat BMET and if kidney function improves will restart rosuvastatin 40 mg daily at that time -Heart healthy diet encouraged  Cellulitis Severe cellulitis in right lower right leg, treated with IV antibiotics, now on oral doxycycline -Continue doxycycline -Managed by PCP           Dispo:  Return in about 6 weeks (around 10/16/2023).  Signed, Denyce Robert, NP

## 2023-09-04 NOTE — Patient Instructions (Addendum)
 Medication Instructions:  CONTINUE TO HOLD YOUR VALSARTAN AND CRESTOR. WILL RESTART IF KIDNEY FUNCTION IS STABLE.    Lab Work: BMET   Testing/Procedures: NONE   Follow-Up: At [facility], you and your health needs are our priority.  As part of our continuing mission to provide you with exceptional heart care, we have created designated Provider Care Teams.  These Care Teams include your primary Cardiologist (physician) and Advanced Practice Providers (APPs -  Physician Assistants and Nurse Practitioners) who all work together to provide you with the care you need, when you need it.   Your next appointment:   4-6 WEEKS BP CHECK  Provider:   Rise Paganini, DNP  Other Instructions:

## 2023-09-05 ENCOUNTER — Other Ambulatory Visit: Payer: Self-pay | Admitting: *Deleted

## 2023-09-05 DIAGNOSIS — I4819 Other persistent atrial fibrillation: Secondary | ICD-10-CM

## 2023-09-05 DIAGNOSIS — I10 Essential (primary) hypertension: Secondary | ICD-10-CM

## 2023-09-05 DIAGNOSIS — L03115 Cellulitis of right lower limb: Secondary | ICD-10-CM | POA: Diagnosis not present

## 2023-09-05 DIAGNOSIS — E782 Mixed hyperlipidemia: Secondary | ICD-10-CM

## 2023-09-05 DIAGNOSIS — L97512 Non-pressure chronic ulcer of other part of right foot with fat layer exposed: Secondary | ICD-10-CM | POA: Diagnosis not present

## 2023-09-05 DIAGNOSIS — E1142 Type 2 diabetes mellitus with diabetic polyneuropathy: Secondary | ICD-10-CM

## 2023-09-05 MED ORDER — AMLODIPINE BESYLATE 5 MG PO TABS: 5.0000 mg | ORAL_TABLET | Freq: Every day | ORAL | 1 refills | Status: AC

## 2023-09-07 DIAGNOSIS — L97512 Non-pressure chronic ulcer of other part of right foot with fat layer exposed: Secondary | ICD-10-CM | POA: Insufficient documentation

## 2023-09-08 DIAGNOSIS — E1140 Type 2 diabetes mellitus with diabetic neuropathy, unspecified: Secondary | ICD-10-CM | POA: Diagnosis not present

## 2023-09-08 DIAGNOSIS — E782 Mixed hyperlipidemia: Secondary | ICD-10-CM | POA: Diagnosis not present

## 2023-09-10 ENCOUNTER — Ambulatory Visit (HOSPITAL_BASED_OUTPATIENT_CLINIC_OR_DEPARTMENT_OTHER): Admitting: Student

## 2023-09-10 DIAGNOSIS — L03115 Cellulitis of right lower limb: Secondary | ICD-10-CM | POA: Diagnosis not present

## 2023-09-10 DIAGNOSIS — L97512 Non-pressure chronic ulcer of other part of right foot with fat layer exposed: Secondary | ICD-10-CM | POA: Diagnosis not present

## 2023-09-11 ENCOUNTER — Encounter (HOSPITAL_BASED_OUTPATIENT_CLINIC_OR_DEPARTMENT_OTHER): Payer: Self-pay | Admitting: Student

## 2023-09-11 ENCOUNTER — Ambulatory Visit (HOSPITAL_BASED_OUTPATIENT_CLINIC_OR_DEPARTMENT_OTHER): Admitting: Student

## 2023-09-11 VITALS — BP 116/69 | HR 73 | Temp 98.3°F | Ht 75.2 in | Wt 325.1 lb

## 2023-09-11 DIAGNOSIS — I10 Essential (primary) hypertension: Secondary | ICD-10-CM

## 2023-09-11 DIAGNOSIS — Z7689 Persons encountering health services in other specified circumstances: Secondary | ICD-10-CM | POA: Diagnosis not present

## 2023-09-11 DIAGNOSIS — E119 Type 2 diabetes mellitus without complications: Secondary | ICD-10-CM

## 2023-09-11 DIAGNOSIS — G8929 Other chronic pain: Secondary | ICD-10-CM

## 2023-09-11 DIAGNOSIS — R488 Other symbolic dysfunctions: Secondary | ICD-10-CM | POA: Insufficient documentation

## 2023-09-11 DIAGNOSIS — M7121 Synovial cyst of popliteal space [Baker], right knee: Secondary | ICD-10-CM | POA: Insufficient documentation

## 2023-09-11 DIAGNOSIS — I480 Paroxysmal atrial fibrillation: Secondary | ICD-10-CM | POA: Diagnosis not present

## 2023-09-11 DIAGNOSIS — M545 Low back pain, unspecified: Secondary | ICD-10-CM | POA: Diagnosis not present

## 2023-09-11 DIAGNOSIS — E785 Hyperlipidemia, unspecified: Secondary | ICD-10-CM | POA: Insufficient documentation

## 2023-09-11 DIAGNOSIS — I872 Venous insufficiency (chronic) (peripheral): Secondary | ICD-10-CM | POA: Diagnosis not present

## 2023-09-11 DIAGNOSIS — E66813 Obesity, class 3: Secondary | ICD-10-CM

## 2023-09-11 NOTE — Assessment & Plan Note (Signed)
 Hypertension is well controlled on amlodipine after switching from valsartan due to concerns about kidney health. - Continue amlodipine for hypertension management.

## 2023-09-11 NOTE — Patient Instructions (Addendum)
 It was nice to see you today!  As we discussed in clinic:  [place]- leg cramps. You can pick this up at many local pharmacies- such as [facility].  You may restart the topical diclofenac. I recommend that you start using an app called MyFitnessPal to keep track of macronutrients and calories. Please let me know when you restart your rosuvastatin- this should be soon.  If you have any problems before your next visit feel free to message me via MyChart (minor issues or questions) or call the office, otherwise you may reach out to schedule an office visit.  Thank you! Gerilyn Pilgrim Dewaun Kinzler, PA-C

## 2023-09-11 NOTE — Assessment & Plan Note (Signed)
 Experiences swelling in the left leg, likely due to venous insufficiency rather than lymphedema. Compression stockings and leg elevation have been tried with limited success. - Consider increasing compression stocking pressure if current level is ineffective. - Continue leg elevation at night.

## 2023-09-11 NOTE — Assessment & Plan Note (Signed)
 Previously on rosuvastatin, which was stopped during a hospital stay. He has not resumed it yet. -Restart rosuvastatin as per previous regimen at seems lipase that he is restarting other medications. -Last kidney function looks to be coming back up to his baseline.

## 2023-09-11 NOTE — Assessment & Plan Note (Signed)
 Diabetes is well controlled with an A1c of 6.2. He is compliant with annual eye exams for diabetic retinopathy screening. No current neuropathy symptoms, which is ideal. - Continue annual eye exams for diabetic retinopathy. - Maintain current diabetes management plan.

## 2023-09-11 NOTE — Assessment & Plan Note (Signed)
 Reports difficulty with word retrieval, has seen a neurologist and speech pathologist with limited improvement. No memory issues reported. Advised to engage in memory and word exercises to improve speech. - Engage in memory and word exercises to improve speech. - Maintain control of blood pressure, diabetes, and cholesterol to reduce risk of cognitive decline.

## 2023-09-11 NOTE — Assessment & Plan Note (Signed)
 Currently at 325 lbs, needs to be below 250 lbs for knee replacement eligibility. On Ozempic for weight management, considering Mounjaro for additional weight loss benefits. Mounjaro may offer extra weight loss potential, but the difference is not substantial enough to warrant switching medications at this time. - Continue Ozempic for weight management. - Consider switching to [facility] if additional weight loss is needed. - Use MyFitnessPal app to track calories and macronutrients.

## 2023-09-11 NOTE — Assessment & Plan Note (Signed)
 Has a Baker's cyst behind the knee, confirmed on imaging he believes that it may be causing pain. Interested in removal options.  Removal may involve aspiration or complete excision. - Consider referral to orthopedics for Baker's cyst removal.

## 2023-09-11 NOTE — Assessment & Plan Note (Signed)
 Chronic pain in the lower right side of the back, requiring a total knee replacement but ineligible due to weight. Uses hydrocodone and Percocet for severe pain, cautious about opioid use. Prefers topical treatments over systemic medications due to lower risk of systemic side effects. - Start diclofenac sodium topical 1% gel for pain management. - Continue lidocaine patches and tiger balm patches for pain relief. -Continue to follow with pain management.

## 2023-09-11 NOTE — Progress Notes (Signed)
 New Patient Office Visit  Subjective    Patient ID: Peter Buck, male    DOB: Jun 10, 1956  Age: 68 y.o. MRN: [id number]  CC:  Chief Complaint  Patient presents with   Establish Care    Pt. Here to establish care. Pt. wants to talk about his pain medications.     Discussed the use of AI scribe software for clinical note transcription with the patient, who gave verbal consent to proceed.  History of Present Illness   Peter Buck is a 68 year old male who presents to establish care.  He is transitioning from his previous providers at [facility] and last had a wellness check approximately seven to eight months ago. He is up to date with colonoscopy screenings, receiving reminders every two to three years.  He has a history of diabetes and undergoes regular annual eye exams with an ophthalmologist at [facility]. He also sees a podiatrist, Dr. Lewie Chamber, who recently treated him for a toenail issue initially concerning for necrosis, but it was not necrotic. He is currently on antibiotics for cellulitis of his LE.  He has a history of high cholesterol and was previously on rosuvastatin, which was discontinued during a hospital stay due to concern in light of AKI. He has stopped several mediations but notes that he is resuming them one at a time- about every 2 days. He switched from valsartan to amlodipine due to concerns about further acute renal issues.   Hypertension- Pt denies chest pain, SOB, dizziness, or heart palpitations.  Taking meds as directed w/o problems.  Denies medication side effects.    He experiences chronic lower right back pain and requires a total knee replacement but does not qualify due to his weight, currently at 325 pounds, down from over 350 pounds. He is on hydrocodone and Percocet for severe pain and uses lidocaine and tiger balm patches for pain management. He has a history of a Baker's cyst, which he believes may be  causing referred pain- he would like a referral to have this removed.  He is on Ozempic for weight management and has switched to a mostly "[place] diet," which includes kimchi and kefir, to aid in weight loss. He avoids high sodium foods and is cautious about fish intake due to "sodium content." Discussed reading labels but advised to increase lean fish intake.  He experiences swelling in his legs, particularly the left, which is painful. He has tried compression stockings and elevating his legs at night without significant relief. He has been evaluated for heart failure per patient and is under cardiology care, no current concern for HF. Cardiology is also treating his atrial fibrillation (coag controlled on eliquis and rate controlled on bystolic).  He experiences cramps and was previously on tizanidine, which was stopped due to potential kidney risks. He is considering alternatives for cramp management.  He reports difficulty with word retrieval and has seen a neurologist who attributed it to a speech pathology issue. He has not found speech therapy helpful and notes subjective worsening of symptoms.     Screenings:  Colon Cancer:  Lung Cancer: no Diabetes: dm type 2  Ophthalmology: [facility] -  Foot: sees podiatry HLD: rosuvastatin- [facility]. Did not restart on rosuvastatin.  Outpatient Encounter Medications as of 09/11/2023  Medication Sig   amLODipine (NORVASC) 5 MG tablet Take 1 tablet (5 mg total) by mouth daily.   apixaban (ELIQUIS) 5 MG TABS tablet Take 1 tablet  by mouth twice daily   aspirin EC 81 MG tablet Take 81 mg by mouth daily.   Calcium Citrate-Vitamin D (CALCIUM CITRATE + D3 PO) Take 1 tablet by mouth at bedtime. 630 mg /12.5 mcg   doxycycline (ADOXA) 100 MG tablet Take 100 mg by mouth 2 (two) times daily.   doxycycline (VIBRAMYCIN) 100 MG capsule Take by mouth.   emtricitabine-tenofovir (TRUVADA) 200-300 MG tablet Take 1 tablet by mouth daily.   ISENTRESS  400 MG tablet Take 400 mg by mouth 2 (two) times daily.   levofloxacin (LEVAQUIN) 750 MG tablet Take 750 mg by mouth daily.   linezolid (ZYVOX) 600 MG tablet Take 600 mg by mouth 2 (two) times daily.   lubiprostone (AMITIZA) 24 MCG capsule    naloxone (NARCAN) nasal spray 4 mg/0.1 mL SMARTSIG:1 Both Nares Daily   nebivolol (BYSTOLIC) 10 MG tablet Take 1 tablet by mouth twice daily   nitroGLYCERIN (NITROSTAT) 0.4 MG SL tablet Place 0.4 mg under the tongue every 5 (five) minutes as needed.   ondansetron (ZOFRAN-ODT) 4 MG disintegrating tablet DISSOLVE 1 TABLET IN MOUTH EVERY 8 HOURS AS NEEDED   Oxcarbazepine (TRILEPTAL) 300 MG tablet Take 300 mg by mouth 2 (two) times daily.   OZEMPIC, 1 MG/DOSE, 4 MG/3ML SOPN Inject 1 mg into the muscle once a week. INJECT THE CONTENTS OF 1 PEN INJECTOR SUBQ ONCE WEEKLY   OZEMPIC, 2 MG/DOSE, 8 MG/3ML SOPN Inject 2 mg into the skin once a week.   acetaminophen (TYLENOL) 650 MG CR tablet Take by mouth. (Patient not taking: Reported on 09/11/2023)   betamethasone dipropionate 0.05 % cream Apply 1 application. topically daily as needed (itching).   celecoxib (CELEBREX) 200 MG capsule Take 200 mg by mouth 2 (two) times daily. (Patient not taking: Reported on 09/04/2023)   clindamycin (CLEOCIN) 150 MG capsule Take 2 capsules (300 mg total) by mouth 4 (four) times daily.   DHEA 25 MG CAPS Take by mouth.   diclofenac Sodium (VOLTAREN) 1 % GEL Apply 1 g topically 4 (four) times daily as needed (pain). (Patient not taking: Reported on 09/04/2023)   DULoxetine (CYMBALTA) 30 MG capsule Take by mouth. (Patient not taking: Reported on 09/11/2023)   EPINEPHrine 0.3 mg/0.3 mL IJ SOAJ injection Inject into the muscle. (Patient not taking: Reported on 09/11/2023)   Fexofenadine HCl (MUCINEX ALLERGY PO) Take by mouth daily as needed. (Patient not taking: Reported on 09/11/2023)   FIBER ADULT GUMMIES PO Take 10 g by mouth daily.   [place] 300 MG CAPS Take 300 mg by mouth in the  morning and at bedtime. (Patient not taking: Reported on 09/11/2023)   Magnesium Citrate 200 MG TABS Take 200 mg by mouth daily. Gummies (Patient not taking: Reported on 09/11/2023)   mometasone (NASONEX) 50 MCG/ACT nasal spray Place 1 spray into the nose daily. (Patient not taking: Reported on 09/11/2023)   mupirocin ointment (BACTROBAN) 2 % Apply topically daily.   oxyCODONE-acetaminophen (PERCOCET) 5-325 MG tablet Take 1-2 tablets by mouth every 4 (four) hours as needed. (Patient not taking: Reported on 09/11/2023)   pramoxine (PROCTOFOAM) 1 % foam as directed Rectal (Patient not taking: Reported on 09/04/2023)   RELISTOR 150 MG TABS Take 3 tablets by mouth daily. (Patient not taking: Reported on 05/07/2023)   rosuvastatin (CRESTOR) 40 MG tablet Take 1 tablet by mouth once daily (Patient not taking: Reported on 09/11/2023)   testosterone cypionate (DEPOTESTOSTERONE CYPIONATE) 200 MG/ML injection Inject 200 mg into the muscle every 21 (  twenty-one) days.   tiZANidine (ZANAFLEX) 4 MG tablet Take 4 mg by mouth every 6 (six) hours as needed for muscle spasms. (Patient not taking: Reported on 09/11/2023)   No facility-administered encounter medications on file as of 09/11/2023.    Past Medical History:  Diagnosis Date   Aftercare following surgery 04/23/2018   Anemia    Arthritis    Arthritis, midfoot 12/05/2016   Asthma    Bipolar affective disorder, depressed (HCC) 02/13/2016   Bipolar disorder (HCC)    Body mass index (bmi) 38.0-38.9, adult 09/16/2017   Body mass index (BMI) 38.0-38.9, adult 09/16/2017   Formatting of this note might be different from the original. IMO 10/01 Updates   Borderline personality disorder (HCC) 04/29/2016   Cervical radiculopathy 02/02/2019   Last Assessment & Plan:  Formatting of this note might be different from the original. MRI of the cervical spine shows cervical spondylosis with facet arthrosis most notable for mild to moderate left neural foraminal stenosis at C3-4.   Per patient, nerve conduction studies showed right C7-8 radiculopathy.    Continue with current regimen as outlined in lumbar degenerative disc plan.   Chronic pain of left knee 10/19/2019   Chronic pain syndrome 02/02/2019   Comprehensive diabetic foot examination, type 2 DM, encounter for (HCC) 12/25/2015   DDD (degenerative disc disease), lumbar 11/25/2017   Last Assessment & Plan:  Formatting of this note might be different from the original. 68 year old male with chronic, predominantly left-sided low back pain with radiation into the left hip and groin.  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 male with Low back pain, and acute on chronic low back pain with left hip, knee, and foot pain.  Today I will treat with the patient's current medication of tramadol and diclofenac and add relafen (temporarily d/c diclofenac) for 10 days and increase lyrica. He will continue with his current appt with his neurosurgery.  Mr. Decola will return to    Muscle spasm of back 02/17/2018   Last Assessment & Plan:  Formatting of this note might be different from the original. Continue with tizanidine 4 mg as needed for muscle spasming up to 3 times daily.   Neuroforaminal stenosis of lumbar spine 12/17/2018   Onychomycosis due to dermatophyte 12/25/2015   Pain associated with accessory navicular bone of foot,  left 05/25/2018   Pain medication agreement 10/16/2018   Last Assessment & Plan:  Formatting of this note might be different from the original. Review of prior UDS results are within normal limits.  [place] controlled substance registry reviewed and there were no inconsistencies noted.   Paroxysmal atrial fibrillation (HCC) 12/23/2019   Plantar fasciitis 01/02/2017   Primary osteoarthritis of both first carpometacarpal joints 08/25/2019   Second degree burn of finger of right hand 08/25/2019   Sleep apnea    Therapeutic opioid induced constipation 06/22/2019  Last Assessment & Plan:  Formatting of this note might be different from the original. Continue amitiza b.i.d.   Verruca 08/25/2019    Past Surgical History:  Procedure Laterality Date   APPENDECTOMY     CARDIOVERSION N/A 07/05/2020   Procedure: CARDIOVERSION;  Surgeon: Sande Rives, MD;  Location: [facility] ENDOSCOPY;  Service: Cardiovascular;  Laterality: N/A;   CARDIOVERSION N/A 10/10/2021   Procedure: CARDIOVERSION;  Surgeon: Lewayne Bunting, MD;  Location: [facility] ENDOSCOPY;  Service: Cardiovascular;  Laterality: N/A;   CHOLECYSTECTOMY     INNER EAR SURGERY     KNEE ARTHROSCOPY     TEE WITHOUT CARDIOVERSION N/A 10/10/2021   Procedure: TRANSESOPHAGEAL ECHOCARDIOGRAM (TEE);  Surgeon: Lewayne Bunting, MD;  Location: [facility] ENDOSCOPY;  Service: Cardiovascular;  Laterality: N/A;    Family History  Problem Relation Age of Onset   Thyroid disease Mother    Macular degeneration Mother     Social History   Socioeconomic History   Marital status: Divorced    Spouse name: Not on file   Number of children: Not on file   Years of education: Not on file   Highest education level: Not on file  Occupational History   Occupation: retired  Tobacco Use   Smoking status: Never   Smokeless tobacco: Never  Vaping Use   Vaping status: Never Used  Substance and Sexual Activity   Alcohol use: No   Drug use: No   Sexual activity: Not  on file  Other Topics Concern   Not on file  Social History Narrative   Not on file   Social Drivers of Health   Financial Resource Strain: Not on file  Food Insecurity: Low Risk  (08/19/2023)   Received from [facility]   Hunger Vital Sign    Worried About Running Out of Food in the Last Year: Never true    Ran Out of Food in the Last Year: Never true  Transportation Needs: No Transportation Needs (08/19/2023)   Received from [facility]    In the past 12 months, has lack of reliable transportation kept you from medical appointments, meetings, work or from getting things needed for daily living? : No  Physical Activity: Not on file  Stress: Not on file  Social Connections: Unknown (10/21/2021)   Received from [facility]   Social Network    Social Network: Not on file  Intimate Partner Violence: Unknown (09/18/2021)   Received from [employer], [facility]   HITS    Physically Hurt: Not on file    Insult or Talk Down To: Not on file    Threaten Physical Harm: Not on file    Scream or Curse: Not on file    ROS  Per HPI      Objective    BP 116/69   Pulse 73   Temp 98.3 F (36.8 C) (Oral)   Ht 6' 3.2" (1.91 m)   Wt (!) 325 lb 1.6 oz (147.5 kg)   SpO2 91%   BMI 40.42 kg/m   Physical Exam      Assessment & Plan:   Encounter to establish care  Essential hypertension Assessment & Plan: Hypertension is well controlled on amlodipine after switching from valsartan due to concerns about kidney health. - Continue amlodipine for hypertension management.   Paroxysmal atrial fibrillation (HCC)  Chronic venous insufficiency Assessment & Plan: Experiences swelling in the left leg, likely due to venous insufficiency rather than lymphedema. Compression  stockings and leg elevation have been tried with limited success. - Consider increasing compression stocking pressure if current level is ineffective. - Continue leg elevation at  night.   Diabetes mellitus without complication (HCC) Assessment & Plan: Diabetes is well controlled with an A1c of 6.2. He is compliant with annual eye exams for diabetic retinopathy screening. No current neuropathy symptoms, which is ideal. - Continue annual eye exams for diabetic retinopathy. - Maintain current diabetes management plan.   Synovial cyst of right popliteal space Assessment & Plan: Has a Baker's cyst behind the knee, confirmed on imaging he believes that it may be causing pain. Interested in removal options.  Removal may involve aspiration or complete excision. - Consider referral to orthopedics for Baker's cyst removal.  Orders: -     Ambulatory referral to Sports Medicine  Anomia Assessment & Plan: Reports difficulty with word retrieval, has seen a neurologist and speech pathologist with limited improvement. No memory issues reported. Advised to engage in memory and word exercises to improve speech. - Engage in memory and word exercises to improve speech. - Maintain control of blood pressure, diabetes, and cholesterol to reduce risk of cognitive decline.   Morbid obesity (HCC) Assessment & Plan: Currently at 325 lbs, needs to be below 250 lbs for knee replacement eligibility. On Ozempic for weight management, considering Mounjaro for additional weight loss benefits. Mounjaro may offer extra weight loss potential, but the difference is not substantial enough to warrant switching medications at this time. - Continue Ozempic for weight management. - Consider switching to [facility] if additional weight loss is needed. - Use MyFitnessPal app to track calories and macronutrients.   Class 3 severe obesity due to excess calories with serious comorbidity and body mass index (BMI) of 40.0 to 44.9 in adult [facility])  Chronic low back pain without sciatica, unspecified back pain laterality Assessment & Plan: Chronic pain in the lower right side of the back, requiring a total  knee replacement but ineligible due to weight. Uses hydrocodone and Percocet for severe pain, cautious about opioid use. Prefers topical treatments over systemic medications due to lower risk of systemic side effects. - Start diclofenac sodium topical 1% gel for pain management. - Continue lidocaine patches and tiger balm patches for pain relief. -Continue to follow with pain management.   Hyperlipidemia, unspecified hyperlipidemia type Assessment & Plan: Previously on rosuvastatin, which was stopped during a hospital stay. He has not resumed it yet. -Restart rosuvastatin as per previous regimen at seems lipase that he is restarting other medications. -Last kidney function looks to be coming back up to his baseline.    Return in about 3 months (around 12/12/2023), or if symptoms worsen or fail to improve, for Chronic Followup.   Teryl Lucy Judge Duque, PA-C

## 2023-09-12 DIAGNOSIS — Z6839 Body mass index (BMI) 39.0-39.9, adult: Secondary | ICD-10-CM | POA: Diagnosis not present

## 2023-09-12 DIAGNOSIS — E782 Mixed hyperlipidemia: Secondary | ICD-10-CM | POA: Diagnosis not present

## 2023-09-12 DIAGNOSIS — E1140 Type 2 diabetes mellitus with diabetic neuropathy, unspecified: Secondary | ICD-10-CM | POA: Diagnosis not present

## 2023-09-12 DIAGNOSIS — Z860101 Personal history of adenomatous and serrated colon polyps: Secondary | ICD-10-CM | POA: Diagnosis not present

## 2023-09-12 DIAGNOSIS — I872 Venous insufficiency (chronic) (peripheral): Secondary | ICD-10-CM | POA: Diagnosis not present

## 2023-09-15 DIAGNOSIS — M1711 Unilateral primary osteoarthritis, right knee: Secondary | ICD-10-CM | POA: Diagnosis not present

## 2023-09-15 DIAGNOSIS — M7121 Synovial cyst of popliteal space [Baker], right knee: Secondary | ICD-10-CM | POA: Diagnosis not present

## 2023-09-16 DIAGNOSIS — I10 Essential (primary) hypertension: Secondary | ICD-10-CM | POA: Diagnosis not present

## 2023-09-16 DIAGNOSIS — E1140 Type 2 diabetes mellitus with diabetic neuropathy, unspecified: Secondary | ICD-10-CM | POA: Diagnosis not present

## 2023-09-18 DIAGNOSIS — E291 Testicular hypofunction: Secondary | ICD-10-CM | POA: Diagnosis not present

## 2023-09-24 DIAGNOSIS — M7121 Synovial cyst of popliteal space [Baker], right knee: Secondary | ICD-10-CM | POA: Diagnosis not present

## 2023-09-24 DIAGNOSIS — L97512 Non-pressure chronic ulcer of other part of right foot with fat layer exposed: Secondary | ICD-10-CM | POA: Diagnosis not present

## 2023-09-24 DIAGNOSIS — M25561 Pain in right knee: Secondary | ICD-10-CM | POA: Diagnosis not present

## 2023-09-24 DIAGNOSIS — L03115 Cellulitis of right lower limb: Secondary | ICD-10-CM | POA: Diagnosis not present

## 2023-09-30 DIAGNOSIS — F603 Borderline personality disorder: Secondary | ICD-10-CM | POA: Diagnosis not present

## 2023-10-02 ENCOUNTER — Encounter: Payer: Self-pay | Admitting: Emergency Medicine

## 2023-10-02 ENCOUNTER — Ambulatory Visit: Attending: Emergency Medicine | Admitting: Emergency Medicine

## 2023-10-02 VITALS — BP 106/66 | HR 77 | Ht 75.0 in | Wt 318.6 lb

## 2023-10-02 DIAGNOSIS — E782 Mixed hyperlipidemia: Secondary | ICD-10-CM | POA: Diagnosis not present

## 2023-10-02 DIAGNOSIS — I10 Essential (primary) hypertension: Secondary | ICD-10-CM

## 2023-10-02 DIAGNOSIS — R0789 Other chest pain: Secondary | ICD-10-CM

## 2023-10-02 DIAGNOSIS — N179 Acute kidney failure, unspecified: Secondary | ICD-10-CM | POA: Diagnosis not present

## 2023-10-02 DIAGNOSIS — I4819 Other persistent atrial fibrillation: Secondary | ICD-10-CM

## 2023-10-02 DIAGNOSIS — G4733 Obstructive sleep apnea (adult) (pediatric): Secondary | ICD-10-CM | POA: Diagnosis not present

## 2023-10-02 NOTE — Progress Notes (Signed)
 Cardiology Office Note:    Date:  10/02/2023  ID:  Peter Buck, DOB 06-18-1955, MRN [id number] PCP: Nehemiah Balling  [facility] Providers Cardiologist:  Oneil Bigness, MD       Patient Profile:      Chief Complaint: 1 month follow-up History of Present Illness:  Peter Buck is a 68 y.o. male with visit-pertinent history of atypical chest pain, persistent atrial fibrillation, hypertension, hyperlipidemia, type 2 diabetes, OSA on CPAP, chronic pain syndrome, bipolar disorder, and obesity   He has history of persistent atrial fibrillation and has undergone multiple cardioversions in the past (most recently in 09/2021).  He has failed Multaq therapy.  Echocardiogram in 2021 showed LVEF 55 to 60%.  Myoview in 2021 showed no evidence of ischemia. Echocardiogram in 09/2021 showed LVEF 55-60%, normal LV function, no RWMA, normal RV, no significant valvular abnormalities.  In October 2023, rate control strategy was recommended for his atrial fibrillation.  He was not felt to be a candidate for ablation given morbid obesity.   In 12/2022 he was hospitalized at [facility] in the setting of chest pain.  Troponins were negative.  Lexiscan Myoview was negative for ischemia.   He was seen in clinic on 05/07/2023. He had recently started a new compound medication from the pain clinic, which contains baclofen and a 'cane' analgesic. He reports it works well for him. He was doing well from a cardiac standpoint.  No medication changes were made and he was to follow-up in 1 year.  He was last seen in clinic on 09/04/2023.  He presented for recent hospitalization for cellulitis and chest pain at [facility].  He had a Lexiscan Myoview that was unremarkable and negative for ischemia.  It was noted during his admission he was given IV vancomycin for cellulitis which caused him to have an acute kidney injury.  Subsequently his rosuvastatin and valsartan were held at discharge.   During his follow-up visit he denied any further anginal symptoms.  He was rate controlled and persistent atrial fibrillation.  His blood pressures were elevated at 156/94 and repeat 148/88.  BMET was updated showing improved creatinine of 1.40 GFR 54.  He was started on amlodipine 5 mg for hypertension.   Discussed the use of AI scribe software for clinical note transcription with the patient, who gave verbal consent to proceed.  History of Present Illness [name] presents today for a follow-up visit. He reports that his kidney function has improved significantly, with his creatinine and GFR levels returning to normal. He has been managing his blood pressure with amlodipine, which was started after discontinuing valsartan due to AKI.  He has been monitoring his blood pressure at home and reports that the readings have been mostly within the normal range.  His average blood pressures at home are less than 130/80.  The patient also mentions that he has been making lifestyle changes to lose weight, including changing his attitude towards food and eliminating recreational eating. He has been successful in this endeavor, losing 15 pounds in the past month. He is motivated to continue this weight loss to qualify for a knee operation.  In addition to these changes, the patient has been exploring the use of herbs for health management under the guidance of Dr. Garnet Just. He has been careful to only try herbs that are safe and won't interact with his medication, Eliquis.  The patient's cellulitis has improved significantly, and he has restarted his cholesterol medication, rosuvastatin, after  it was temporarily held due to his kidney injury. He also mentions that he uses a CPAP machine for sleep apnea and is hoping to discontinue its use with further weight loss.  He denies chest pain, shortness of breath, lower extremity edema, fatigue, palpitations, melena, hematuria, hemoptysis, diaphoresis, weakness,  presyncope, syncope, orthopnea, and PND.  Review of systems:  Please see the history of present illness. All other systems are reviewed and otherwise negative.     Home Medications:    Current Meds  Medication Sig   amLODipine (NORVASC) 5 MG tablet Take 1 tablet (5 mg total) by mouth daily.   apixaban (ELIQUIS) 5 MG TABS tablet Take 1 tablet by mouth twice daily   aspirin EC 81 MG tablet Take 81 mg by mouth daily.   betamethasone dipropionate 0.05 % cream Apply 1 application. topically daily as needed (itching).   Calcium Citrate-Vitamin D (CALCIUM CITRATE + D3 PO) Take 1 tablet by mouth at bedtime. 630 mg /12.5 mcg   celecoxib (CELEBREX) 200 MG capsule Take 200 mg by mouth 2 (two) times daily.   DHEA 25 MG CAPS Take by mouth.   diclofenac Sodium (VOLTAREN) 1 % GEL Apply 1 g topically 4 (four) times daily as needed (pain).   DULoxetine (CYMBALTA) 30 MG capsule Take by mouth.   emtricitabine-tenofovir (TRUVADA) 200-300 MG tablet Take 1 tablet by mouth daily.   EPINEPHrine 0.3 mg/0.3 mL IJ SOAJ injection Inject into the muscle.   Fexofenadine HCl (MUCINEX ALLERGY PO) Take by mouth daily as needed.   FIBER ADULT GUMMIES PO Take 10 g by mouth daily.   [place] 300 MG CAPS Take 300 mg by mouth in the morning and at bedtime.   ISENTRESS 400 MG tablet Take 400 mg by mouth 2 (two) times daily.   levofloxacin (LEVAQUIN) 750 MG tablet Take 750 mg by mouth daily.   linezolid (ZYVOX) 600 MG tablet Take 600 mg by mouth 2 (two) times daily.   lubiprostone (AMITIZA) 24 MCG capsule    Magnesium Citrate 200 MG TABS Take 200 mg by mouth daily. Gummies   mometasone (NASONEX) 50 MCG/ACT nasal spray Place 1 spray into the nose daily.   mupirocin ointment (BACTROBAN) 2 % Apply topically daily.   naloxone (NARCAN) nasal spray 4 mg/0.1 mL SMARTSIG:1 Both Nares Daily   nebivolol (BYSTOLIC) 10 MG tablet Take 1 tablet by mouth twice daily   nitroGLYCERIN (NITROSTAT) 0.4 MG SL tablet Place 0.4 mg under  the tongue every 5 (five) minutes as needed.   ondansetron (ZOFRAN-ODT) 4 MG disintegrating tablet DISSOLVE 1 TABLET IN MOUTH EVERY 8 HOURS AS NEEDED   Oxcarbazepine (TRILEPTAL) 300 MG tablet Take 300 mg by mouth 2 (two) times daily.   oxyCODONE-acetaminophen (PERCOCET) 5-325 MG tablet Take 1-2 tablets by mouth every 4 (four) hours as needed.   OZEMPIC, 2 MG/DOSE, 8 MG/3ML SOPN Inject 2 mg into the skin once a week.   pramoxine (PROCTOFOAM) 1 % foam    RELISTOR 150 MG TABS Take 3 tablets by mouth daily.   rosuvastatin (CRESTOR) 40 MG tablet Take 1 tablet by mouth once daily   testosterone cypionate (DEPOTESTOSTERONE CYPIONATE) 200 MG/ML injection Inject 200 mg into the muscle every 21 ( twenty-one) days.   tiZANidine (ZANAFLEX) 4 MG tablet Take 4 mg by mouth every 6 (six) hours as needed for muscle spasms.   Studies Reviewed:       Lexiscan Myoview 08/11/2023 No evidence of ischemia   TEE 10/10/2021 1. Left ventricular  ejection fraction, by estimation, is 55 to 60%. The  left ventricle has normal function. The left ventricle has no regional  wall motion abnormalities.   2. Right ventricular systolic function is normal. The right ventricular  size is mildly enlarged.   3. Left atrial size was mildly dilated. No left atrial/left atrial  appendage thrombus was detected.   4. Right atrial size was mildly dilated.   5. The mitral valve is normal in structure. Trivial mitral valve  regurgitation.   6. The aortic valve is normal in structure. Aortic valve regurgitation is  not visualized.  Risk Assessment/Calculations:    CHA2DS2-VASc Score = 5   This indicates a 7.2% annual risk of stroke. The patient's score is based upon: CHF History: 0 HTN History: 1 Diabetes History: 1 Stroke History: 2 Vascular Disease History: 0 Age Score: 1 Gender Score: 0            Physical Exam:   VS:  BP 106/66 (BP Location: Left Arm, Patient Position: Sitting, Cuff Size: Normal) Comment: Pt says he  didnt take meds  Pulse 77   Ht 6\' 3"  (1.905 m)   Wt (!) 318 lb 9.6 oz (144.5 kg)   SpO2 97%   BMI 39.82 kg/m    Wt Readings from Last 3 Encounters:  10/02/23 (!) 318 lb 9.6 oz (144.5 kg)  09/11/23 (!) 325 lb 1.6 oz (147.5 kg)  09/04/23 (!) 333 lb (151 kg)    GEN: Well nourished, well developed in no acute distress NECK: No JVD; No carotid bruits CARDIAC: RRR, no murmurs, rubs, gallops RESPIRATORY:  Clear to auscultation without rales, wheezing or rhonchi  ABDOMEN: Soft, non-tender, non-distended EXTREMITIES:  No edema; No acute deformity     Assessment and Plan:  Atypical chest pain Lexiscan Myoview 07/2023 and 12/2022 showed no evidence of ischemia - Today he is without any anginal symptoms, no indication for further ischemic evaluation at this time - Not on ASA as he is currently on OAC   Acute kidney injury H/o vancomycin induced AKI during admission for lower leg cellulitis on 07/2023 - Most recent BMET shows kidney function has normalized with creatinine 1.25 and GFR 62 on 09/10/2023   Persistent atrial fibrillation Rate control strategy was decided due to obesity, failed Multaq, and multiple cardioversions in the past with the last one being in 09/2021.  Was not felt to be candidate for ablation given morbid obesity - Today rate is well-controlled on Bystolic - He reports he is overall asymptomatic and unaware of his arrhythmia.  No chest pains or shortness of breath - Continue Bystolic 10 mg twice daily - Continue Eliquis 5 mg twice daily, no bleeding concerns   Hypertension Blood pressure today is 106/66 and under excellent control He brings in his blood pressure log from home indicating average blood pressure is less than 130/80 and well-controlled Of note valsartan 80 mg was transitioned to amlodipine 5 mg due to vancomycin induced AKI which has since resolved - Continue amlodipine 5 mg daily and Bystolic 10 mg daily   Hyperlipidemia LDL 60 on 02/2023 and under  excellent control Plan on repeating fasted lipid panel at follow-up visit - Continue rosuvastatin 40 mg daily  Obstructive sleep apnea Remains compliant with CPAP therapy  Obesity BMI 39.82 - Recently lost 15 pounds within the past month - Recommend DASH diet (high in vegetables, fruits, low-fat dairy products, whole grains, poultry, fish, and nuts and low in sweets, sugar-sweetened beverages, and red meats), salt restriction  and increase physical activity.       Dispo:  Return in about 6 months (around 04/02/2024).  Signed, Ava Boatman, NP

## 2023-10-02 NOTE — Patient Instructions (Signed)
 Medication Instructions:  NO CHANGES   Lab Work: NONE    Testing/Procedures: NONE  Follow-Up: At [facility], you and your health needs are our priority.  As part of our continuing mission to provide you with exceptional heart care, our providers are all part of one team.  This team includes your primary Cardiologist (physician) and Advanced Practice Providers or APPs (Physician Assistants and Nurse Practitioners) who all work together to provide you with the care you need, when you need it.  Your next appointment:   6 month(s)  Provider:   Oneil Bigness, MD OR Palmer Bobo, DNP    Other Instructions:      1st Floor: - Lobby - Registration  - Pharmacy  - Lab - Cafe  2nd Floor: - PV Lab - Diagnostic Testing (echo, CT, nuclear med)  3rd Floor: - Vacant  4th Floor: - TCTS (cardiothoracic surgery) - AFib Clinic - Structural Heart Clinic - Vascular Surgery  - Vascular Ultrasound  5th Floor: - HeartCare Cardiology (general and EP) - Clinical Pharmacy for coumadin, hypertension, lipid, weight-loss medications, and med management appointments    Valet parking services will be available as well.

## 2023-10-02 NOTE — Progress Notes (Deleted)
 Cardiology Office Note:    Date:  10/02/2023  ID:  Peter Buck, DOB Aug 08, 1955, MRN [id number] PCP: Peter Buck  [facility] Providers Cardiologist:  Peter Harps, MD       Patient Profile:      Chief Complaint: Hospital follow-up for chest pain  History of Present Illness:  Peter Buck is a 68 y.o. male with visit-pertinent history of atypical chest pain, persistent atrial fibrillation, hypertension, hyperlipidemia, type 2 diabetes, OSA on CPAP, chronic pain syndrome, bipolar disorder, and obesity  He has history of persistent atrial fibrillation and has undergone multiple cardioversions in the past (most recently in 09/2021).  He has failed Multaq therapy.  Echocardiogram in 2021 showed LVEF 55 to 60%.  Myoview in 2021 showed no evidence of ischemia. Echocardiogram in 09/2021 showed LVEF 55-60%, normal LV function, no RWMA, normal RV, no significant valvular abnormalities.  In October 2023, rate control strategy was recommended for his atrial fibrillation.  He was not felt to be a candidate for ablation given morbid obesity.  In 12/2022 he was hospitalized at [facility] in the setting of chest pain.  Troponins were negative.  Lexiscan Myoview was negative for ischemia.  He was last seen in clinic on 05/07/2023. He had recently started a new compound medication from the pain clinic, which contains baclofen and a 'cane' analgesic. He reports it works well for him. He was doing well from a cardiac standpoint.  No medication changes were made and he was to follow-up in 1 year.   Discussed the use of AI scribe software for clinical note transcription with the patient, who gave verbal consent to proceed.    Today, patient presents for follow-up after recent hospitalization for cellulitis and chest pain at [facility].  Unfortunately there is no documentation to review at this time on his recent hospitalization.  He notes he was discharged from [facility] 1 week ago and was admitted for 9 days.  He notes he developed chest pain which awoke him from sleep which had brought him to the ED.  The chest pain was short lasting and has since resolved entirely.  He notes he was primarily admitted for cellulitis when the ED physician performed a physical exam. He had a Lexiscan Myoview during admission that was negative for ischemia and low risk.  He does tell me that during his admission for treatment of cellulitis he was given IV vancomycin which caused him to have an acute kidney injury.  Subsequently his rosuvastatin and valsartan were held at discharge.  He followed up with his general practitioner yesterday who had started him on doxycycline for his lower leg cellulitis.  At this time I do not have access to his lab work from his hospitalization however he tells me that his creatinine levels increased to 1.7 during hospitalization, but stabilized at 1.7 for three days before discharge.     Review of systems:  Please see the history of present illness. All other systems are reviewed and otherwise negative.     Home Medications:    No outpatient medications have been marked as taking for the 10/02/23 encounter (Appointment) with Denyce Robert, NP.   Studies Reviewed:        Lexiscan Myoview 08/11/2023 No evidence of ischemia  TEE 10/10/2021 1. Left ventricular ejection fraction, by estimation, is 55 to 60%. The  left ventricle has normal function. The left ventricle has no regional  wall motion abnormalities.   2. Right  ventricular systolic function is normal. The right ventricular  size is mildly enlarged.   3. Left atrial size was mildly dilated. No left atrial/left atrial  appendage thrombus was detected.   4. Right atrial size was mildly dilated.   5. The mitral valve is normal in structure. Trivial mitral valve  regurgitation.   6. The aortic valve is normal in structure. Aortic valve regurgitation is  not visualized.    Risk Assessment/Calculations:    CHA2DS2-VASc Score = 5   This indicates a 7.2% annual risk of stroke. The patient's score is based upon: CHF History: 0 HTN History: 1 Diabetes History: 1 Stroke History: 2 Vascular Disease History: 0 Age Score: 1 Gender Score: 0   {This patient has a significant risk of stroke if diagnosed with atrial fibrillation.  Please consider VKA or DOAC agent for anticoagulation if the bleeding risk is acceptable.   You can also use the SmartPhrase .[email or web address] for documentation.   :[id number]} No BP recorded.  {Refresh Note OR Click here to enter BP  :1}***       Physical Exam:   VS:  There were no vitals taken for this visit.   Wt Readings from Last 3 Encounters:  09/11/23 (!) 325 lb 1.6 oz (147.5 kg)  09/04/23 (!) 333 lb (151 kg)  05/07/23 (!) 339 lb 12.8 oz (154.1 kg)    GEN: Well nourished, well developed in no acute distress NECK: No JVD; No carotid bruits CARDIAC: Irregularly irregular rhythm, no murmurs, rubs, gallops RESPIRATORY:  Clear to auscultation without rales, wheezing or rhonchi  ABDOMEN: Soft, non-tender, non-distended EXTREMITIES: 1+ bilateral lower extremity edema.  Right leg cellulitis with redness.  Area of demarcation marked by PCP; No acute deformity     Assessment and Plan:  Chest pain During recent admission at [facility] he had noted to have chest pains.  His chest pain was left-sided, lasted a few hours, with no radiation of pain.  He denied any alleviating or aggravating factors. Lexiscan Myoview performed during his admission showed no evidence of ischemia -Today he is without any anginal symptoms, no indication for further ischemic evaluation at this time -Not on ASA as he is currently on OAC  Acute kidney injury Patient notes during recent admission for lower leg cellulitis he had received IV vancomycin which had caused an AKI.  Unfortunately we do not have lab work from recent admission at [facility].   His rosuvastatin and valsartan were subsequently held at discharge -Repeat BMET today  Persistent atrial fibrillation Rate control strategy was decided due to obesity and failed Multaq Rate is well-controlled on Bystolic EKG today shows atrial fibrillation with heart rate 97 bpm -Today he is overall asymptomatic without complaints of tachycardia -Continue Bystolic 10 mg twice daily -Continue Eliquis 5 mg twice daily, no bleeding concerns  Hypertension Blood pressure today 156/94 and repeat 148/88 Blood pressure currently not at goal of less than 130/80 His valsartan has been held over the last week due to vancomycin induced AKI.  Unfortunately no recent lab work available as noted above. -Will update BMET today with goal to start him back on Valsartan 80 mg twice daily if kidney function has improved.  If kidney function does however remain abnormal can consider amlodipine at that time -Recommend daily blood pressure checks at home -Continue Bystolic 10 mg twice daily  Hyperlipidemia LDL 60 on 02/2023 Rosuvastatin 40 mg recently held in the setting of AKI -Will repeat BMET and if kidney function improves will restart  rosuvastatin 40 mg daily at that time -Heart healthy diet encouraged  Cellulitis Severe cellulitis in right lower right leg, treated with IV antibiotics, now on oral doxycycline -Continue doxycycline -Managed by PCP           Dispo:  No follow-ups on file.  Signed, Ava Boatman, NP

## 2023-10-16 DIAGNOSIS — E1140 Type 2 diabetes mellitus with diabetic neuropathy, unspecified: Secondary | ICD-10-CM | POA: Diagnosis not present

## 2023-10-16 DIAGNOSIS — I10 Essential (primary) hypertension: Secondary | ICD-10-CM | POA: Diagnosis not present

## 2023-10-17 DIAGNOSIS — E291 Testicular hypofunction: Secondary | ICD-10-CM | POA: Diagnosis not present

## 2023-10-28 DIAGNOSIS — M8580 Other specified disorders of bone density and structure, unspecified site: Secondary | ICD-10-CM | POA: Diagnosis not present

## 2023-10-28 DIAGNOSIS — M1990 Unspecified osteoarthritis, unspecified site: Secondary | ICD-10-CM | POA: Diagnosis not present

## 2023-10-28 DIAGNOSIS — M25562 Pain in left knee: Secondary | ICD-10-CM | POA: Diagnosis not present

## 2023-10-28 DIAGNOSIS — M25561 Pain in right knee: Secondary | ICD-10-CM | POA: Diagnosis not present

## 2023-10-28 DIAGNOSIS — R768 Other specified abnormal immunological findings in serum: Secondary | ICD-10-CM | POA: Diagnosis not present

## 2023-10-28 DIAGNOSIS — M19049 Primary osteoarthritis, unspecified hand: Secondary | ICD-10-CM | POA: Diagnosis not present

## 2023-10-30 DIAGNOSIS — M461 Sacroiliitis, not elsewhere classified: Secondary | ICD-10-CM | POA: Diagnosis not present

## 2023-11-07 DIAGNOSIS — M1712 Unilateral primary osteoarthritis, left knee: Secondary | ICD-10-CM | POA: Diagnosis not present

## 2023-11-07 DIAGNOSIS — M25562 Pain in left knee: Secondary | ICD-10-CM | POA: Diagnosis not present

## 2023-11-11 ENCOUNTER — Other Ambulatory Visit: Payer: Self-pay | Admitting: Nurse Practitioner

## 2023-11-16 DIAGNOSIS — I10 Essential (primary) hypertension: Secondary | ICD-10-CM | POA: Diagnosis not present

## 2023-11-16 DIAGNOSIS — E1140 Type 2 diabetes mellitus with diabetic neuropathy, unspecified: Secondary | ICD-10-CM | POA: Diagnosis not present

## 2023-11-17 DIAGNOSIS — E66813 Obesity, class 3: Secondary | ICD-10-CM | POA: Diagnosis not present

## 2023-11-17 DIAGNOSIS — M5416 Radiculopathy, lumbar region: Secondary | ICD-10-CM | POA: Diagnosis not present

## 2023-11-17 DIAGNOSIS — E11610 Type 2 diabetes mellitus with diabetic neuropathic arthropathy: Secondary | ICD-10-CM | POA: Diagnosis not present

## 2023-11-17 DIAGNOSIS — M51362 Other intervertebral disc degeneration, lumbar region with discogenic back pain and lower extremity pain: Secondary | ICD-10-CM | POA: Diagnosis not present

## 2023-11-17 DIAGNOSIS — J309 Allergic rhinitis, unspecified: Secondary | ICD-10-CM | POA: Diagnosis not present

## 2023-11-17 DIAGNOSIS — T402X5A Adverse effect of other opioids, initial encounter: Secondary | ICD-10-CM | POA: Diagnosis not present

## 2023-11-17 DIAGNOSIS — F1190 Opioid use, unspecified, uncomplicated: Secondary | ICD-10-CM | POA: Diagnosis not present

## 2023-11-17 DIAGNOSIS — Z599 Problem related to housing and economic circumstances, unspecified: Secondary | ICD-10-CM | POA: Diagnosis not present

## 2023-11-17 DIAGNOSIS — K5903 Drug induced constipation: Secondary | ICD-10-CM | POA: Diagnosis not present

## 2023-11-17 DIAGNOSIS — E1142 Type 2 diabetes mellitus with diabetic polyneuropathy: Secondary | ICD-10-CM | POA: Diagnosis not present

## 2023-11-17 DIAGNOSIS — M7918 Myalgia, other site: Secondary | ICD-10-CM | POA: Diagnosis not present

## 2023-11-17 DIAGNOSIS — Z79891 Long term (current) use of opiate analgesic: Secondary | ICD-10-CM | POA: Diagnosis not present

## 2023-11-17 DIAGNOSIS — Z6837 Body mass index (BMI) 37.0-37.9, adult: Secondary | ICD-10-CM | POA: Diagnosis not present

## 2023-11-17 DIAGNOSIS — M25561 Pain in right knee: Secondary | ICD-10-CM | POA: Diagnosis not present

## 2023-11-18 DIAGNOSIS — E291 Testicular hypofunction: Secondary | ICD-10-CM | POA: Diagnosis not present

## 2023-11-19 DIAGNOSIS — M25562 Pain in left knee: Secondary | ICD-10-CM | POA: Diagnosis not present

## 2023-11-24 DIAGNOSIS — M1711 Unilateral primary osteoarthritis, right knee: Secondary | ICD-10-CM | POA: Diagnosis not present

## 2023-11-24 DIAGNOSIS — S83272A Complex tear of lateral meniscus, current injury, left knee, initial encounter: Secondary | ICD-10-CM | POA: Diagnosis not present

## 2023-11-24 DIAGNOSIS — S83232A Complex tear of medial meniscus, current injury, left knee, initial encounter: Secondary | ICD-10-CM | POA: Diagnosis not present

## 2023-11-25 IMAGING — DX DG CHEST 2V
2 series · 2 of 2 positions shown · non-contrast
Comparison: Chest x-ray 11/03/2019.

CLINICAL DATA: Chest pain.

EXAM:
CHEST - 2 VIEW

[chest pa]
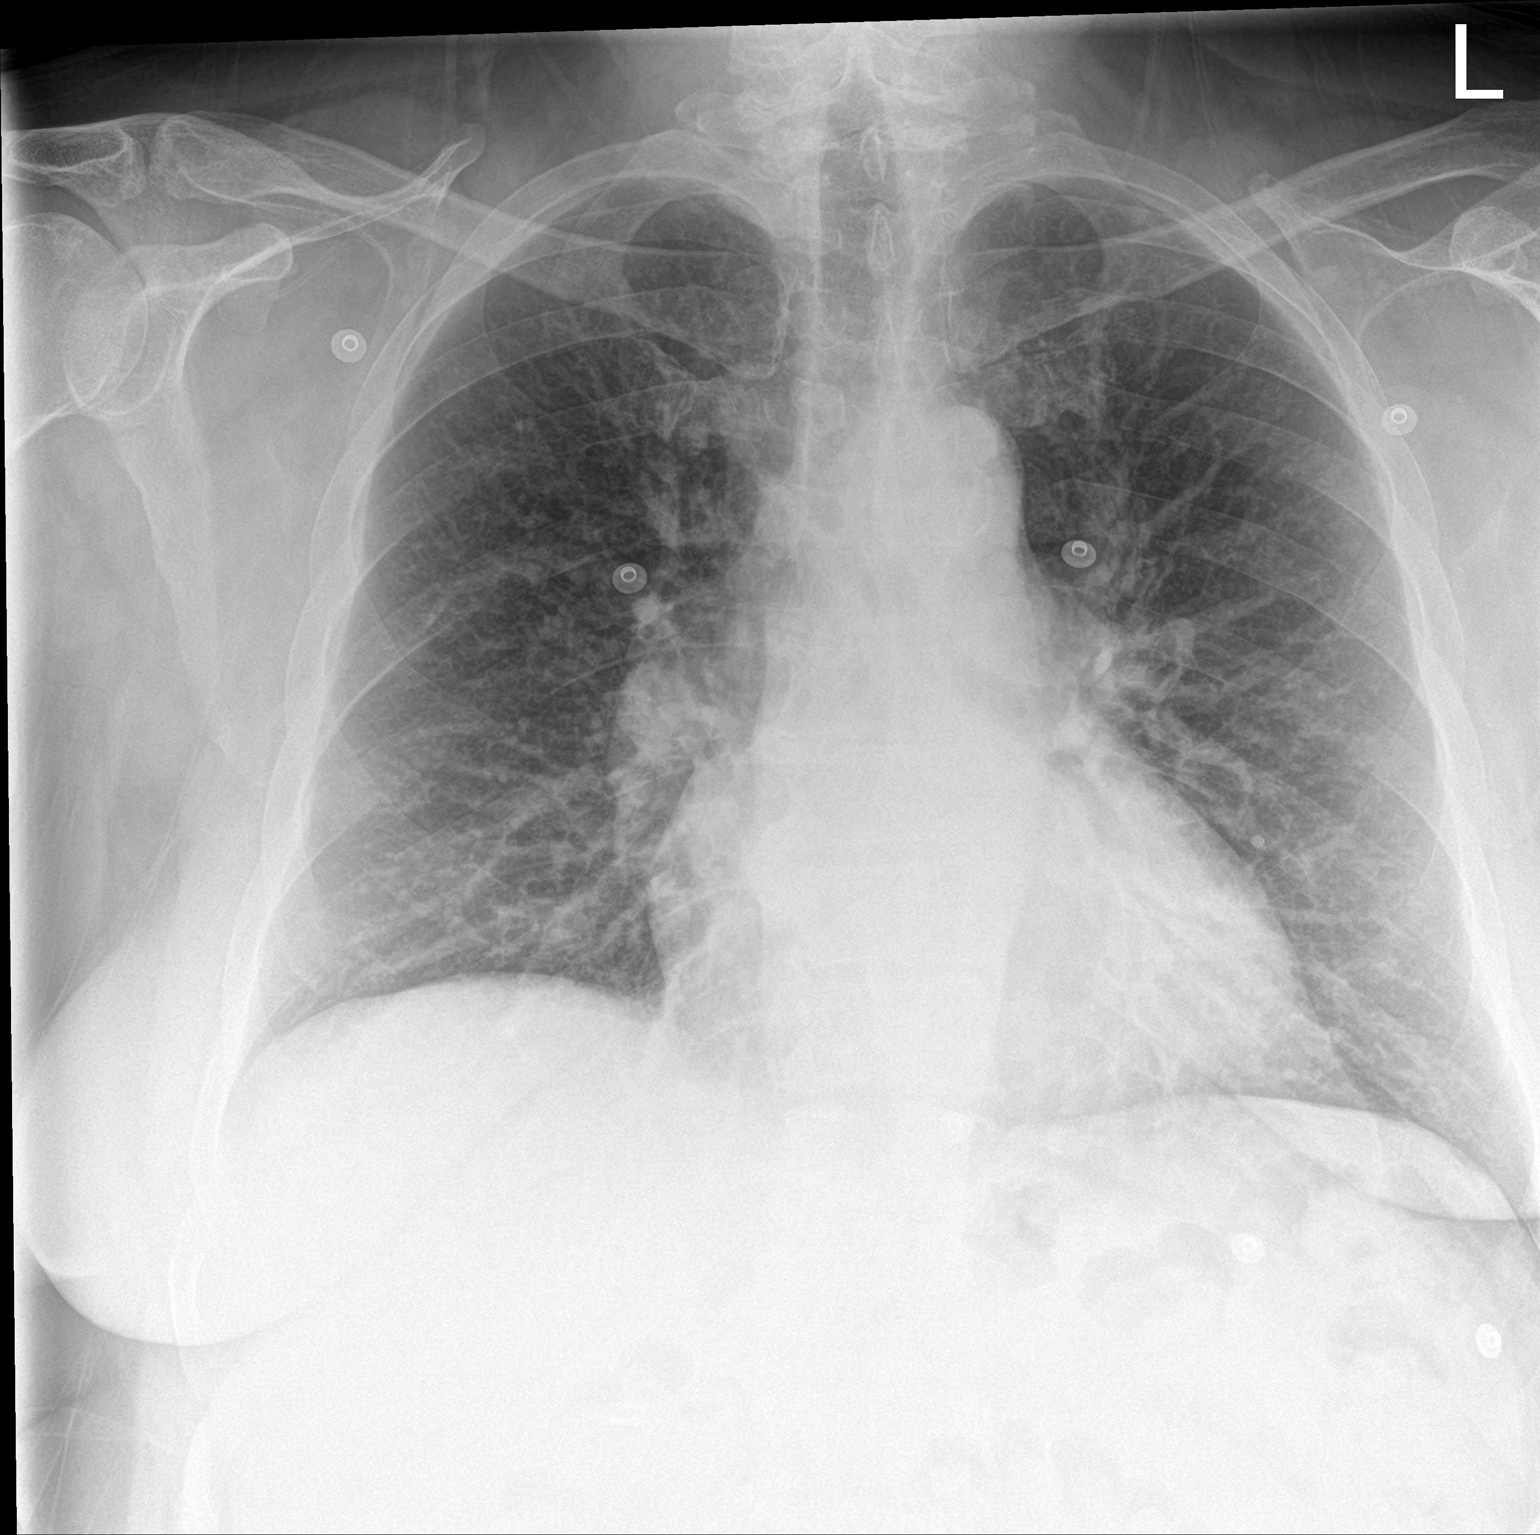

[chest lat]
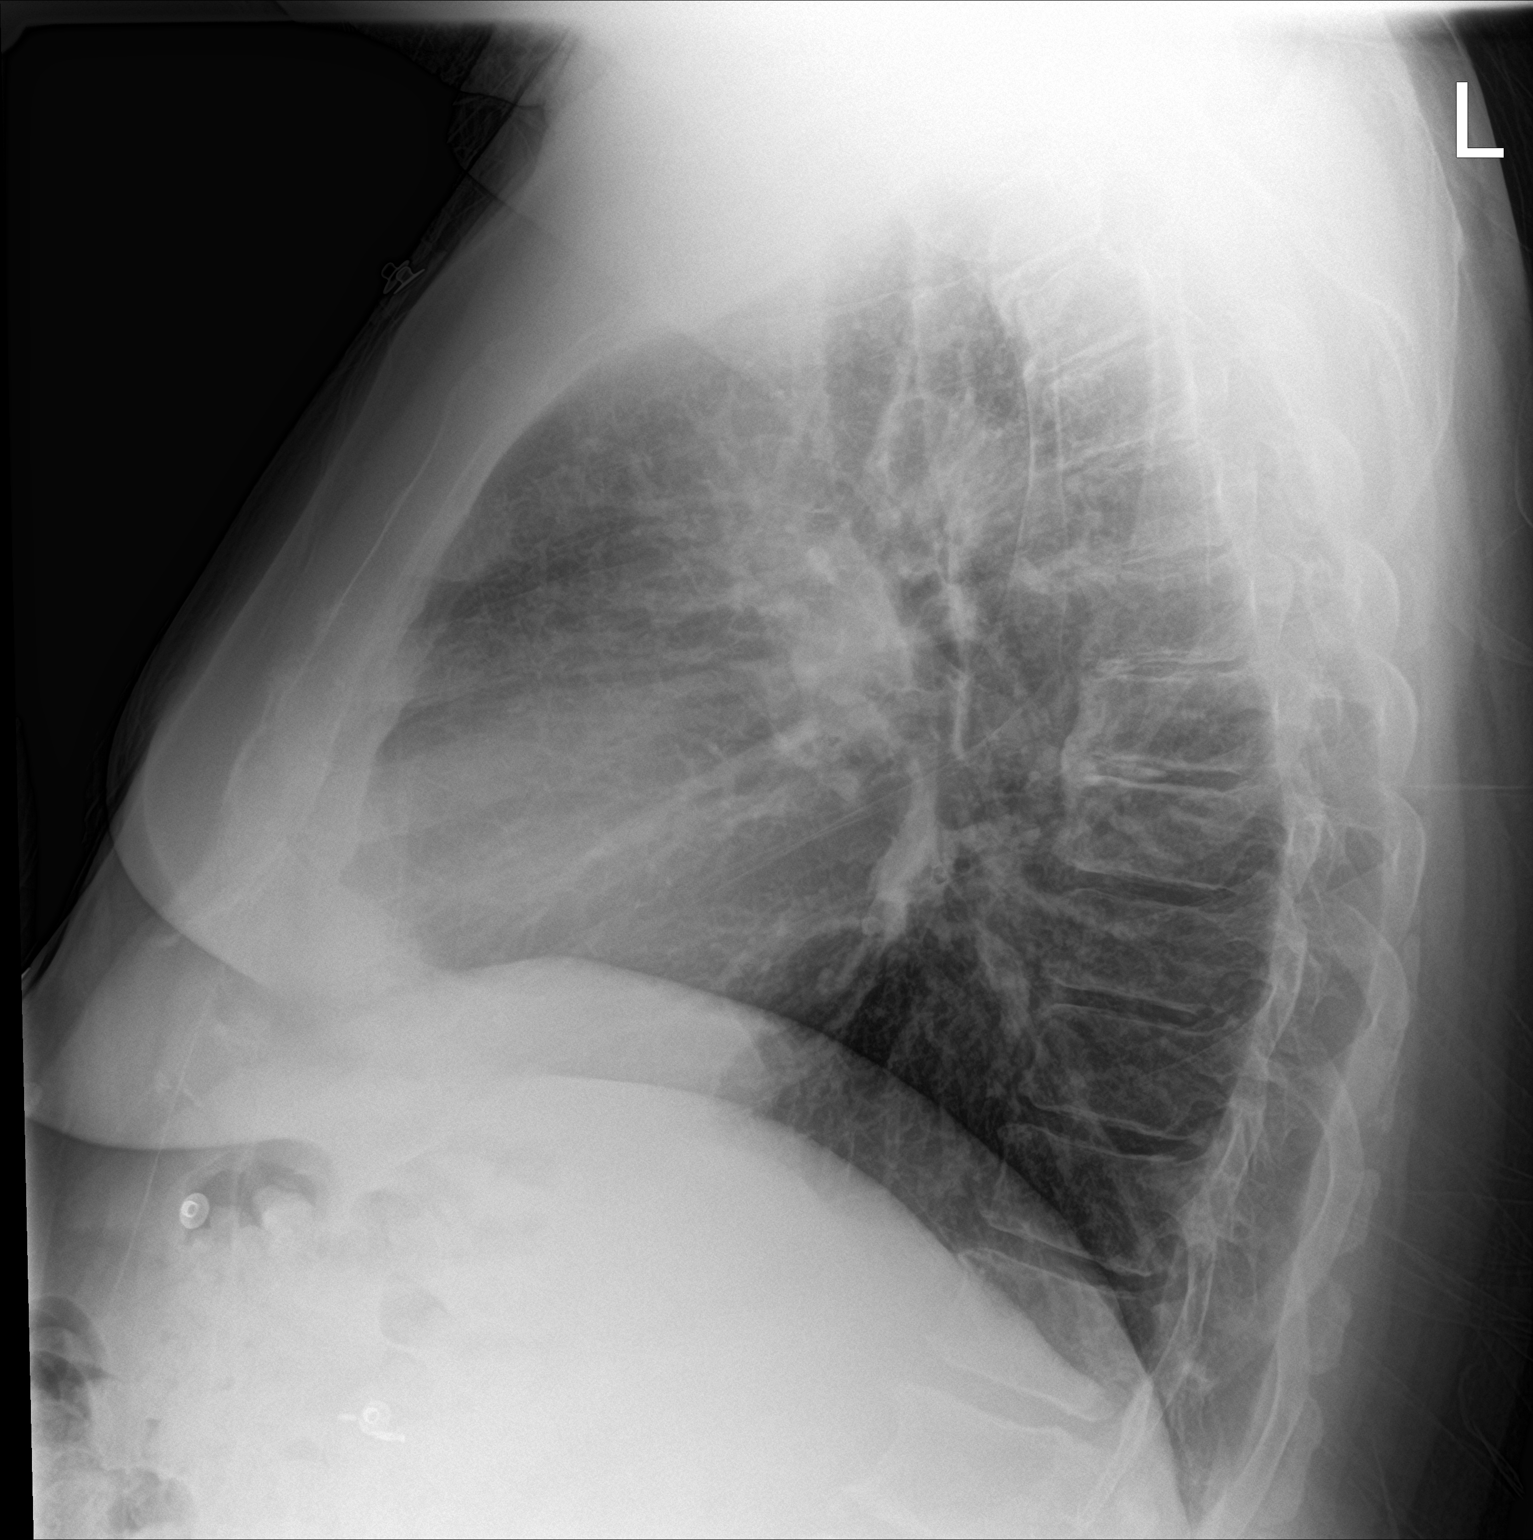

[2 of 2 positions shown; findings below may reference images not displayed]

FINDINGS: The heart size and mediastinal contours are within normal limits.
Both lungs are clear. The visualized skeletal structures are
unremarkable.
IMPRESSION: No active cardiopulmonary disease.

## 2023-12-02 DIAGNOSIS — F603 Borderline personality disorder: Secondary | ICD-10-CM | POA: Diagnosis not present

## 2023-12-05 DIAGNOSIS — E1140 Type 2 diabetes mellitus with diabetic neuropathy, unspecified: Secondary | ICD-10-CM | POA: Diagnosis not present

## 2023-12-05 DIAGNOSIS — E782 Mixed hyperlipidemia: Secondary | ICD-10-CM | POA: Diagnosis not present

## 2023-12-08 DIAGNOSIS — Z01818 Encounter for other preprocedural examination: Secondary | ICD-10-CM | POA: Diagnosis not present

## 2023-12-11 ENCOUNTER — Ambulatory Visit (HOSPITAL_BASED_OUTPATIENT_CLINIC_OR_DEPARTMENT_OTHER): Admitting: Student

## 2023-12-11 ENCOUNTER — Other Ambulatory Visit: Payer: Self-pay | Admitting: Cardiovascular Disease

## 2023-12-11 DIAGNOSIS — M461 Sacroiliitis, not elsewhere classified: Secondary | ICD-10-CM | POA: Diagnosis not present

## 2023-12-11 DIAGNOSIS — I480 Paroxysmal atrial fibrillation: Secondary | ICD-10-CM

## 2023-12-11 NOTE — Telephone Encounter (Signed)
 Prescription refill request for Eliquis  received. Indication: Afib  Last office visit: 10/02/23 Berry)  Scr: 1.25 (09/10/23)  Age: 68 Weight: 144.5kg  Appropriate dose. Refill sent.

## 2023-12-15 DIAGNOSIS — M545 Low back pain, unspecified: Secondary | ICD-10-CM | POA: Diagnosis not present

## 2023-12-15 DIAGNOSIS — M5416 Radiculopathy, lumbar region: Secondary | ICD-10-CM | POA: Diagnosis not present

## 2023-12-15 DIAGNOSIS — M4726 Other spondylosis with radiculopathy, lumbar region: Secondary | ICD-10-CM | POA: Diagnosis not present

## 2023-12-15 DIAGNOSIS — M51362 Other intervertebral disc degeneration, lumbar region with discogenic back pain and lower extremity pain: Secondary | ICD-10-CM | POA: Diagnosis not present

## 2023-12-15 DIAGNOSIS — M48061 Spinal stenosis, lumbar region without neurogenic claudication: Secondary | ICD-10-CM | POA: Diagnosis not present

## 2023-12-16 DIAGNOSIS — I10 Essential (primary) hypertension: Secondary | ICD-10-CM | POA: Diagnosis not present

## 2023-12-16 DIAGNOSIS — E1140 Type 2 diabetes mellitus with diabetic neuropathy, unspecified: Secondary | ICD-10-CM | POA: Diagnosis not present

## 2023-12-17 DIAGNOSIS — M25561 Pain in right knee: Secondary | ICD-10-CM | POA: Diagnosis not present

## 2023-12-17 DIAGNOSIS — F1190 Opioid use, unspecified, uncomplicated: Secondary | ICD-10-CM | POA: Diagnosis not present

## 2023-12-17 DIAGNOSIS — M5416 Radiculopathy, lumbar region: Secondary | ICD-10-CM | POA: Diagnosis not present

## 2023-12-17 DIAGNOSIS — T402X5A Adverse effect of other opioids, initial encounter: Secondary | ICD-10-CM | POA: Diagnosis not present

## 2023-12-17 DIAGNOSIS — G8929 Other chronic pain: Secondary | ICD-10-CM | POA: Diagnosis not present

## 2023-12-17 DIAGNOSIS — E11610 Type 2 diabetes mellitus with diabetic neuropathic arthropathy: Secondary | ICD-10-CM | POA: Diagnosis not present

## 2023-12-17 DIAGNOSIS — M51362 Other intervertebral disc degeneration, lumbar region with discogenic back pain and lower extremity pain: Secondary | ICD-10-CM | POA: Diagnosis not present

## 2023-12-17 DIAGNOSIS — R030 Elevated blood-pressure reading, without diagnosis of hypertension: Secondary | ICD-10-CM | POA: Diagnosis not present

## 2023-12-17 DIAGNOSIS — K5903 Drug induced constipation: Secondary | ICD-10-CM | POA: Diagnosis not present

## 2023-12-17 DIAGNOSIS — E66813 Obesity, class 3: Secondary | ICD-10-CM | POA: Diagnosis not present

## 2023-12-17 DIAGNOSIS — Z79891 Long term (current) use of opiate analgesic: Secondary | ICD-10-CM | POA: Diagnosis not present

## 2023-12-17 DIAGNOSIS — M25562 Pain in left knee: Secondary | ICD-10-CM | POA: Diagnosis not present

## 2023-12-18 DIAGNOSIS — M7989 Other specified soft tissue disorders: Secondary | ICD-10-CM | POA: Diagnosis not present

## 2023-12-18 DIAGNOSIS — M14672 Charcot's joint, left ankle and foot: Secondary | ICD-10-CM | POA: Diagnosis not present

## 2023-12-23 ENCOUNTER — Telehealth: Payer: Self-pay | Admitting: *Deleted

## 2023-12-23 DIAGNOSIS — E291 Testicular hypofunction: Secondary | ICD-10-CM | POA: Diagnosis not present

## 2023-12-23 NOTE — Telephone Encounter (Signed)
   Pre-operative Risk Assessment    Patient Name: Peter Buck  DOB: 1956/04/15 MRN: [id number]   Date of last office visit: 10/02/23 MADISON FOUNTAIN, NP Date of next office visit: NONE   Request for Surgical Clearance    Procedure:  COLONOSCOPY  Date of Surgery:  Clearance TBD                                Surgeon:  DR. EVALENE FANTI Surgeon's Group or Practice Name:  [facility]  Phone number:  [phone] Fax number:  [phone]   Type of Clearance Requested:   - Medical  - Pharmacy:  Hold Aspirin and Apixaban  (Eliquis ) ( request is asking for ASA x 5 day hold and asking for Eliquis  x 2 days prior hold)   Type of Anesthesia:  PROPOFOL     Additional requests/questions:    Bonney Niels Jest   12/23/2023, 8:57 AM

## 2023-12-26 NOTE — Telephone Encounter (Signed)
 Patient with diagnosis of A Fib on Eliquis  for anticoagulation.    Procedure: colonoscopy Date of procedure: TBD   CHA2DS2-VASc Score = 5  This indicates a 7.2% annual risk of stroke. The patient's score is based upon: CHF History: 0 HTN History: 1 Diabetes History: 1 Stroke History: 2 Vascular Disease History: 0 Age Score: 1 Gender Score: 0    CrCl 76 ml/min using adj body weight Platelet count 200K   Per office protocol, patient can hold Eliquis  for 2 days prior to procedure.    **This guidance is not considered finalized until pre-operative APP has relayed final recommendations.**

## 2023-12-26 NOTE — Telephone Encounter (Signed)
   Patient Name: Peter Buck  DOB: [birth year] MRN: [id number]  Primary Cardiologist: Darryle ONEIDA Decent, MD  Chart reviewed as part of pre-operative protocol coverage. Given past medical history and time since last visit, based on ACC/AHA guidelines, [name] is at acceptable risk for the planned procedure without further cardiovascular testing.   Patient may hold Eliquis  for 2 days prior to the procedure and aspirin for 5 days prior to the procedure.  He should resume both medication as soon as possible afterward at the GI doctor's discretion based on bleeding risk.  I will route this recommendation to the requesting party via Epic fax function and remove from pre-op pool.  Please call with questions.  Caedmon Louque, [place] 12/26/2023, 3:31 PM

## 2023-12-29 DIAGNOSIS — F603 Borderline personality disorder: Secondary | ICD-10-CM | POA: Diagnosis not present

## 2024-01-16 DIAGNOSIS — E1140 Type 2 diabetes mellitus with diabetic neuropathy, unspecified: Secondary | ICD-10-CM | POA: Diagnosis not present

## 2024-01-16 DIAGNOSIS — I10 Essential (primary) hypertension: Secondary | ICD-10-CM | POA: Diagnosis not present

## 2024-01-26 DIAGNOSIS — E291 Testicular hypofunction: Secondary | ICD-10-CM | POA: Diagnosis not present

## 2024-01-27 DIAGNOSIS — Z79899 Other long term (current) drug therapy: Secondary | ICD-10-CM | POA: Diagnosis not present

## 2024-01-27 DIAGNOSIS — Z125 Encounter for screening for malignant neoplasm of prostate: Secondary | ICD-10-CM | POA: Diagnosis not present

## 2024-01-27 DIAGNOSIS — N1831 Chronic kidney disease, stage 3a: Secondary | ICD-10-CM | POA: Diagnosis not present

## 2024-02-02 DIAGNOSIS — F603 Borderline personality disorder: Secondary | ICD-10-CM | POA: Diagnosis not present

## 2024-02-11 DIAGNOSIS — M6281 Muscle weakness (generalized): Secondary | ICD-10-CM | POA: Diagnosis not present

## 2024-02-11 DIAGNOSIS — M545 Low back pain, unspecified: Secondary | ICD-10-CM | POA: Diagnosis not present

## 2024-02-11 DIAGNOSIS — Z6837 Body mass index (BMI) 37.0-37.9, adult: Secondary | ICD-10-CM | POA: Diagnosis not present

## 2024-02-11 DIAGNOSIS — G4733 Obstructive sleep apnea (adult) (pediatric): Secondary | ICD-10-CM | POA: Diagnosis not present

## 2024-02-11 DIAGNOSIS — R2689 Other abnormalities of gait and mobility: Secondary | ICD-10-CM | POA: Diagnosis not present

## 2024-02-11 DIAGNOSIS — Z1330 Encounter for screening examination for mental health and behavioral disorders, unspecified: Secondary | ICD-10-CM | POA: Diagnosis not present

## 2024-02-16 DIAGNOSIS — I10 Essential (primary) hypertension: Secondary | ICD-10-CM | POA: Diagnosis not present

## 2024-02-16 DIAGNOSIS — E1140 Type 2 diabetes mellitus with diabetic neuropathy, unspecified: Secondary | ICD-10-CM | POA: Diagnosis not present

## 2024-02-17 DIAGNOSIS — M6281 Muscle weakness (generalized): Secondary | ICD-10-CM | POA: Diagnosis not present

## 2024-02-17 DIAGNOSIS — R2689 Other abnormalities of gait and mobility: Secondary | ICD-10-CM | POA: Diagnosis not present

## 2024-02-17 DIAGNOSIS — M545 Low back pain, unspecified: Secondary | ICD-10-CM | POA: Diagnosis not present

## 2024-02-18 DIAGNOSIS — M1712 Unilateral primary osteoarthritis, left knee: Secondary | ICD-10-CM | POA: Diagnosis not present

## 2024-02-23 DIAGNOSIS — M6281 Muscle weakness (generalized): Secondary | ICD-10-CM | POA: Diagnosis not present

## 2024-02-23 DIAGNOSIS — M545 Low back pain, unspecified: Secondary | ICD-10-CM | POA: Diagnosis not present

## 2024-02-23 DIAGNOSIS — R2689 Other abnormalities of gait and mobility: Secondary | ICD-10-CM | POA: Diagnosis not present

## 2024-02-24 ENCOUNTER — Other Ambulatory Visit: Payer: Self-pay | Admitting: Cardiovascular Disease

## 2024-02-26 DIAGNOSIS — E291 Testicular hypofunction: Secondary | ICD-10-CM | POA: Diagnosis not present

## 2024-02-26 DIAGNOSIS — R2689 Other abnormalities of gait and mobility: Secondary | ICD-10-CM | POA: Diagnosis not present

## 2024-02-26 DIAGNOSIS — M545 Low back pain, unspecified: Secondary | ICD-10-CM | POA: Diagnosis not present

## 2024-02-26 DIAGNOSIS — M6281 Muscle weakness (generalized): Secondary | ICD-10-CM | POA: Diagnosis not present

## 2024-03-01 DIAGNOSIS — M545 Low back pain, unspecified: Secondary | ICD-10-CM | POA: Diagnosis not present

## 2024-03-01 DIAGNOSIS — R2689 Other abnormalities of gait and mobility: Secondary | ICD-10-CM | POA: Diagnosis not present

## 2024-03-01 DIAGNOSIS — M6281 Muscle weakness (generalized): Secondary | ICD-10-CM | POA: Diagnosis not present

## 2024-03-08 DIAGNOSIS — M6281 Muscle weakness (generalized): Secondary | ICD-10-CM | POA: Diagnosis not present

## 2024-03-08 DIAGNOSIS — M545 Low back pain, unspecified: Secondary | ICD-10-CM | POA: Diagnosis not present

## 2024-03-08 DIAGNOSIS — R2689 Other abnormalities of gait and mobility: Secondary | ICD-10-CM | POA: Diagnosis not present

## 2024-03-11 DIAGNOSIS — G894 Chronic pain syndrome: Secondary | ICD-10-CM | POA: Diagnosis not present

## 2024-03-11 DIAGNOSIS — K5903 Drug induced constipation: Secondary | ICD-10-CM | POA: Diagnosis not present

## 2024-03-11 DIAGNOSIS — Z79891 Long term (current) use of opiate analgesic: Secondary | ICD-10-CM | POA: Diagnosis not present

## 2024-03-11 DIAGNOSIS — Z5181 Encounter for therapeutic drug level monitoring: Secondary | ICD-10-CM | POA: Diagnosis not present

## 2024-03-11 DIAGNOSIS — E66813 Obesity, class 3: Secondary | ICD-10-CM | POA: Diagnosis not present

## 2024-03-11 DIAGNOSIS — T402X5A Adverse effect of other opioids, initial encounter: Secondary | ICD-10-CM | POA: Diagnosis not present

## 2024-03-11 DIAGNOSIS — F1190 Opioid use, unspecified, uncomplicated: Secondary | ICD-10-CM | POA: Diagnosis not present

## 2024-03-12 ENCOUNTER — Telehealth: Payer: Self-pay

## 2024-03-12 DIAGNOSIS — M1711 Unilateral primary osteoarthritis, right knee: Secondary | ICD-10-CM | POA: Diagnosis not present

## 2024-03-12 DIAGNOSIS — M7121 Synovial cyst of popliteal space [Baker], right knee: Secondary | ICD-10-CM | POA: Diagnosis not present

## 2024-03-12 NOTE — Telephone Encounter (Signed)
 Pharmacy please advise on holding Eliquis  prior to Right knee total anthroplasty scheduled for TBD. Last labs (CBC/BMET) 08/09/2023. Thank you.

## 2024-03-12 NOTE — Telephone Encounter (Signed)
   Pre-operative Risk Assessment    Patient Name: Peter Buck  DOB: 05/12/1956 MRN: [id number]   Date of last office visit: 10/02/23  Lum Louis Date of next office visit: NA   Request for Surgical Clearance    Procedure:  Right Total Knee Anthroplasty  Date of Surgery:  Clearance TBD                               Surgeon:  Arley Alert, MD Surgeon's Group or Practice Name:  [facility] Phone number:  [phone] Fax number:  [phone]   Type of Clearance Requested:   - Medical  - Pharmacy:  Hold Aspirin and Apixaban  (Eliquis )     Type of Anesthesia:  General    Additional requests/questions:    Bonney Arlyne LITTIE Kallie   03/12/2024, 2:47 PM

## 2024-03-15 DIAGNOSIS — F603 Borderline personality disorder: Secondary | ICD-10-CM | POA: Diagnosis not present

## 2024-03-17 DIAGNOSIS — I10 Essential (primary) hypertension: Secondary | ICD-10-CM | POA: Diagnosis not present

## 2024-03-17 DIAGNOSIS — E1140 Type 2 diabetes mellitus with diabetic neuropathy, unspecified: Secondary | ICD-10-CM | POA: Diagnosis not present

## 2024-03-18 DIAGNOSIS — M6281 Muscle weakness (generalized): Secondary | ICD-10-CM | POA: Diagnosis not present

## 2024-03-18 DIAGNOSIS — M545 Low back pain, unspecified: Secondary | ICD-10-CM | POA: Diagnosis not present

## 2024-03-18 DIAGNOSIS — E1129 Type 2 diabetes mellitus with other diabetic kidney complication: Secondary | ICD-10-CM | POA: Diagnosis not present

## 2024-03-18 DIAGNOSIS — R2689 Other abnormalities of gait and mobility: Secondary | ICD-10-CM | POA: Diagnosis not present

## 2024-03-22 NOTE — Telephone Encounter (Signed)
 Patient with diagnosis of afib on Eliquis  for anticoagulation.    Procedure: Right Total Knee Anthroplasty  Date of procedure: TBD   CHA2DS2-VASc Score = 5   This indicates a 7.2% annual risk of stroke. The patient's score is based upon: CHF History: 0 HTN History: 1 Diabetes History: 1 Stroke History: 2 Vascular Disease History: 0 Age Score: 1 Gender Score: 0      CrCl 85 ml/min Platelet count 200  Patient has not had an Afib/aflutter ablation or Watchman within the last 3 months or DCCV within the last 30 days   Stroke in 2021   Per office protocol, patient can hold Eliquis  for 3 days prior to procedure.    Please resume full anticoagulation as soon as safely possible post op  **This guidance is not considered finalized until pre-operative APP has relayed final recommendations.**

## 2024-03-22 NOTE — Telephone Encounter (Signed)
 Tried x 2 to call pt but recording came on said call cannot go through right now, try call again later.

## 2024-03-22 NOTE — Telephone Encounter (Signed)
   Name: Peter Buck  DOB: 1956-03-07  MRN: [id number]  Primary Cardiologist: Darryle ONEIDA Decent, MD   Preoperative team, please contact this patient and set up a phone call appointment for further preoperative risk assessment. Please obtain consent and complete medication review. Thank you for your help.  I confirm that guidance regarding antiplatelet and oral anticoagulation therapy has been completed and, if necessary, noted below.  Patient has not had an Afib/aflutter ablation or Watchman within the last 3 months or DCCV within the last 30 days    Stroke in 2021    Per office protocol, patient can hold Eliquis  for 3 days prior to procedure.     Please resume full anticoagulation as soon as safely possible post op  I also confirmed the patient resides in the state of [place] . As per [facility] Medical Board telemedicine laws, the patient must reside in the state in which the provider is licensed.    Barnie Hila, NP 03/22/2024, 1:41 PM [facility]

## 2024-03-23 DIAGNOSIS — M545 Low back pain, unspecified: Secondary | ICD-10-CM | POA: Diagnosis not present

## 2024-03-23 DIAGNOSIS — R2689 Other abnormalities of gait and mobility: Secondary | ICD-10-CM | POA: Diagnosis not present

## 2024-03-23 DIAGNOSIS — M6281 Muscle weakness (generalized): Secondary | ICD-10-CM | POA: Diagnosis not present

## 2024-03-25 ENCOUNTER — Telehealth (HOSPITAL_BASED_OUTPATIENT_CLINIC_OR_DEPARTMENT_OTHER): Payer: Self-pay | Admitting: *Deleted

## 2024-03-25 NOTE — Telephone Encounter (Signed)
 Pt has been scheduled tele preop appt 04/01/24. Pt said he goes to the [facility] Tuesday 10/1 and he thinks his [facility] may tell him he cannot have the knee surgery right now as he has gingivitis and wil need tx to be done. We agreed the pt will let the office know if the surgery will be post poned and to cancel the tele preop appt. Med rec and consent are done.

## 2024-03-25 NOTE — Telephone Encounter (Signed)
 Pt has been scheduled tele preop appt 04/01/24. Pt said he goes to the [facility] Tuesday 10/1 and he thinks his DDS may tell him he cannot have the knee surgery right now as he has gingivitis and wil need tx to be done. We agreed the pt will let the office know if the surgery will be post poned and to cancel the tele preop appt. Med rec and consent are done.     Patient Consent for Virtual Visit        Belmont [name] has provided verbal consent on 03/25/2024 for a virtual visit (video or telephone).   CONSENT FOR VIRTUAL VISIT FOR:  Peter Buck  By participating in this virtual visit I agree to the following:  I hereby voluntarily request, consent and authorize [facility] and its employed or contracted physicians, physician assistants, nurse practitioners or other licensed health care professionals (the Practitioner), to provide me with telemedicine health care services (the "Services) as deemed necessary by the treating Practitioner. I acknowledge and consent to receive the Services by the Practitioner via telemedicine. I understand that the telemedicine visit will involve communicating with the Practitioner through live audiovisual communication technology and the disclosure of certain medical information by electronic transmission. I acknowledge that I have been given the opportunity to request an in-person assessment or other available alternative prior to the telemedicine visit and am voluntarily participating in the telemedicine visit.  I understand that I have the right to withhold or withdraw my consent to the use of telemedicine in the course of my care at any time, without affecting my right to future care or treatment, and that the Practitioner or I may terminate the telemedicine visit at any time. I understand that I have the right to inspect all information obtained and/or recorded in the course of the telemedicine visit and may receive copies of available information for a  reasonable fee.  I understand that some of the potential risks of receiving the Services via telemedicine include:  Delay or interruption in medical evaluation due to technological equipment failure or disruption; Information transmitted may not be sufficient (e.g. poor resolution of images) to allow for appropriate medical decision making by the Practitioner; and/or  In rare instances, security protocols could fail, causing a breach of personal health information.  Furthermore, I acknowledge that it is my responsibility to provide information about my medical history, conditions and care that is complete and accurate to the best of my ability. I acknowledge that Practitioner's advice, recommendations, and/or decision may be based on factors not within their control, such as incomplete or inaccurate data provided by me or distortions of diagnostic images or specimens that may result from electronic transmissions. I understand that the practice of medicine is not an exact science and that Practitioner makes no warranties or guarantees regarding treatment outcomes. I acknowledge that a copy of this consent can be made available to me via my patient portal [facility] MyChart), or I can request a printed copy by calling the office of [facility].    I understand that my insurance will be billed for this visit.   I have read or had this consent read to me. I understand the contents of this consent, which adequately explains the benefits and risks of the Services being provided via telemedicine.  I have been provided ample opportunity to ask questions regarding this consent and the Services and have had my questions answered to my satisfaction. I give my informed consent for  the services to be provided through the use of telemedicine in my medical care

## 2024-03-26 DIAGNOSIS — E291 Testicular hypofunction: Secondary | ICD-10-CM | POA: Diagnosis not present

## 2024-03-26 DIAGNOSIS — M6281 Muscle weakness (generalized): Secondary | ICD-10-CM | POA: Diagnosis not present

## 2024-03-26 DIAGNOSIS — M545 Low back pain, unspecified: Secondary | ICD-10-CM | POA: Diagnosis not present

## 2024-03-26 DIAGNOSIS — R2689 Other abnormalities of gait and mobility: Secondary | ICD-10-CM | POA: Diagnosis not present

## 2024-04-01 ENCOUNTER — Ambulatory Visit: Attending: Student in an Organized Health Care Education/Training Program

## 2024-04-01 DIAGNOSIS — Z01810 Encounter for preprocedural cardiovascular examination: Secondary | ICD-10-CM

## 2024-04-01 NOTE — Progress Notes (Signed)
 Virtual Visit via Telephone Note   Because of Clenton Esper co-morbid illnesses, he is at least at moderate risk for complications without adequate follow up.  This format is felt to be most appropriate for this patient at this time.  Due to technical limitations with video connection (technology), today's appointment will be conducted as an audio only telehealth visit, and Aengus Sauceda verbally agreed to proceed in this manner.   All issues noted in this document were discussed and addressed.  No physical exam could be performed with this format.  Evaluation Performed:  Preoperative cardiovascular risk assessment _____________   Date:  04/01/2024   Patient ID:  Dallas Decent, DOB Jul 03, 1955, MRN [id number] Patient Location:  Home Provider location:   Office  Primary Care Provider:  Iven Lang DASEN, PA-C Primary Cardiologist:  Darryle DASEN Decent, MD Chief Complaint / Patient Profile  68 y.o. y/o male with a h/o atrial fibrillation, hypertension, hyperlipidemia, type 2 diabetes, chronic pain disorder, OSA on CPAP who is pending right total knee arthroplasty and presents today for telephonic preoperative cardiovascular risk assessment. History of Present Illness  Mizraim Harmening is a 68 y.o. male who presents via audio/video conferencing for a telehealth visit today.  Pt was last seen in cardiology clinic on 10/02/2023 by Lum Louis, NP.  At that time Melquan Ernsberger was doing well, it was noted the patient had undergone Lexiscan  Myoview  in 07/2023 with no evidence of ischemia.  The patient is now pending procedure as outlined above. Since his last visit, he has remained stable from a cardiac standpoint. Today he denies chest pain, shortness of breath, fatigue, palpitations, melena, hematuria, hemoptysis, diaphoresis, weakness, presyncope, syncope, orthopnea, and PND. He notes that his mobility is somewhat limited as a result of his knee injuries however he is able to achieve greater than 4  METs of activity.  Past Medical History    Past Medical History:  Diagnosis Date   Aftercare following surgery 04/23/2018   Anemia    Arthritis    Arthritis, midfoot 12/05/2016   Asthma    Bipolar affective disorder, depressed (HCC) 02/13/2016   Bipolar disorder (HCC)    Body mass index (bmi) 38.0-38.9, adult 09/16/2017   Body mass index (BMI) 38.0-38.9, adult 09/16/2017   Formatting of this note might be different from the original. IMO 10/01 Updates   Borderline personality disorder (HCC) 04/29/2016   Cervical radiculopathy 02/02/2019   Last Assessment & Plan:  Formatting of this note might be different from the original. MRI of the cervical spine shows cervical spondylosis with facet arthrosis most notable for mild to moderate left neural foraminal stenosis at C3-4.  Per patient, nerve conduction studies showed right C7-8 radiculopathy.    Continue with current regimen as outlined in lumbar degenerative disc plan.   Chronic pain of left knee 10/19/2019   Chronic pain syndrome 02/02/2019   Comprehensive diabetic foot examination, type 2 DM, encounter for (HCC) 12/25/2015   DDD (degenerative disc disease), lumbar 11/25/2017   Last Assessment & Plan:  Formatting of this note might be different from the original. 68 year old male with chronic, predominantly left-sided low back pain with radiation into the left hip and groin.  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 male with Low back pain, and acute on chronic low back pain with left hip, knee, and foot pain.  Today I will treat with the patient's current medication of tramadol and diclofenac  and add relafen (temporarily d/c diclofenac ) for 10 days and increase lyrica. He will continue with his current appt with his neurosurgery.  Mr. Carter will return to    Muscle spasm of back 02/17/2018   Last Assessment & Plan:  Formatting of this note might be different from the original. Continue with tizanidine 4 mg as needed for muscle spasming up to 3 times daily.   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 Review of prior UDS results are within normal limits.  [place]  controlled substance registry reviewed and there were no inconsistencies noted.   Paroxysmal atrial fibrillation (HCC) 12/23/2019   Plantar fasciitis 01/02/2017   Primary osteoarthritis of both first carpometacarpal joints 08/25/2019   Second degree burn of finger of right hand 08/25/2019   Sleep apnea    Therapeutic opioid induced constipation 06/22/2019   Last Assessment & Plan:  Formatting of this note might be different from the original. Continue amitiza 24mcg b.i.d.   Verruca 08/25/2019   Past Surgical History:  Procedure Laterality Date   APPENDECTOMY     CARDIOVERSION N/A 07/05/2020   Procedure: CARDIOVERSION;  Surgeon: Barbaraann Darryle Ned, MD;  Location: [facility] ENDOSCOPY;  Service: Cardiovascular;  Laterality: N/A;   CARDIOVERSION N/A 10/10/2021   Procedure: CARDIOVERSION;  Surgeon: Pietro Redell RAMAN, MD;  Location: [facility] ENDOSCOPY;  Service: Cardiovascular;  Laterality: N/A;   CHOLECYSTECTOMY     INNER EAR SURGERY      KNEE ARTHROSCOPY     TEE WITHOUT CARDIOVERSION N/A 10/10/2021   Procedure: TRANSESOPHAGEAL ECHOCARDIOGRAM (TEE);  Surgeon: Pietro Redell RAMAN, MD;  Location: [facility] ENDOSCOPY;  Service: Cardiovascular;  Laterality: N/A;    Allergies  Allergies  Allergen Reactions   Augmentin [Amoxicillin-Pot Clavulanate] Anaphylaxis   Fish Allergy Anaphylaxis    Specifically Salmon   Iodinated Contrast Media Anaphylaxis, Hives, Other (See Comments) and Rash    Severe hypotension Developed reaction a day and a half after a lumbar nerve root block on 09/22/17.  Treated by dermatologist with prednisone  taper and antihistamines. Developed reaction a day and a half after a lumbar nerve root block on 09/22/17.  Treated by dermatologist with prednisone  taper and antihistamines. Developed reaction a day and a half after a lumbar nerve root block on 09/22/17.  Treated by dermatologist with prednisone  taper and antihistamines.    Ketorolac  Anaphylaxis   Toradol  [Ketorolac  Tromethamine ] Anaphylaxis    It put me in shock.  My blood pressure tanked and I was itchy all over; had to go to the ER.   Atorvastatin Other (See Comments)    Severe back ache   Guaifenesin Swelling and Other (See Comments)    It sorta made my throat close up.    Penicillins Dermatitis   Statins  Other (See Comments)    Severe back ache   Alendronate Sodium Other (See Comments)    Unknown reaction    Dextromethorphan Other (See Comments)    Unknown reaction   Lovastatin Other (See Comments)    Severe joint pain   Pine Tar Other (See Comments)    Arms get puffy and itching   Pseudoephedrine Other (See Comments)    It dries me out.  I take it when my other allergy medicine isn't working.    Home Medications    Prior to Admission medications   Medication Sig Start Date End Date Taking? Authorizing Provider  acetaminophen  (TYLENOL ) 650 MG CR tablet Take by mouth. Patient not taking: Reported on 10/02/2023    [provider]   amLODipine  (NORVASC ) 5 MG tablet Take 1 tablet (5 mg total) by mouth daily. 09/05/23   Rana Lum CROME, NP  apixaban  (ELIQUIS ) 5 MG TABS tablet Take 1 tablet by mouth twice daily 12/11/23   O'Neal, Darryle Ned, MD  aspirin EC 81 MG tablet Take 81 mg by mouth daily.    [provider]  betamethasone  dipropionate 0.05 % cream Apply 1 application. topically daily as needed (itching).    [provider]  Calcium  Citrate-Vitamin D (CALCIUM  CITRATE + D3 PO) Take 1 tablet by mouth at bedtime. 630 mg /12.5 mcg    [provider]  celecoxib (CELEBREX) 200 MG capsule Take 200 mg by mouth 2 (two) times daily. Patient taking differently: Take 100 mg by mouth 2 (two) times daily. PER PT DOSE DECREASED 11/15/19   [provider]  DHEA 25 MG CAPS Take by mouth.    [provider]  diclofenac  Sodium (VOLTAREN ) 1 % GEL Apply 1 g topically 4 (four) times daily as needed (pain).    [provider]  doxycycline (ADOXA) 100 MG tablet Take 100 mg by mouth 2 (two) times daily. Patient not taking: Reported on 10/02/2023 09/02/23   [provider]  DULoxetine (CYMBALTA) 30 MG capsule Take by mouth.    [provider]  emtricitabine-tenofovir (TRUVADA) 200-300 MG tablet Take 1 tablet by mouth daily. 06/16/23   [provider]  EPINEPHrine  0.3 mg/0.3 mL IJ SOAJ injection Inject into the muscle.    [provider]  Fexofenadine HCl (MUCINEX ALLERGY PO) Take by mouth daily as needed.    [provider]  FIBER ADULT GUMMIES PO Take 10 g by mouth daily.    [provider]  Horse Chestnut 300 MG CAPS Take 300 mg by mouth in the morning and at bedtime.    [provider]  ISENTRESS 400 MG tablet Take 400 mg by mouth 2 (two) times daily. 06/17/23   [provider]  levofloxacin (LEVAQUIN) 750 MG tablet Take 750 mg by mouth daily. 08/28/23   [provider]  linezolid (ZYVOX) 600 MG tablet Take  600 mg by mouth 2 (two) times daily. 08/28/23   [provider]  lubiprostone (AMITIZA) 24 MCG capsule     [provider]  Magnesium  Citrate 200 MG TABS Take 200 mg by mouth daily. Gummies    [provider]  mometasone (NASONEX) 50 MCG/ACT nasal spray Place 1 spray into the nose daily.    [provider]  mupirocin ointment (BACTROBAN) 2 % Apply topically daily.    [provider]  naloxone [facility]) nasal spray 4 mg/0.1 mL SMARTSIG:1 Both Nares Daily 08/12/23   [provider]  nebivolol  (BYSTOLIC ) 10 MG tablet Take  1 tablet by mouth twice daily 11/12/23   Daneen Damien BROCKS, NP  nitroGLYCERIN (NITROSTAT) 0.4 MG SL tablet Place 0.4 mg under the tongue every 5 (five) minutes as needed.    [provider]  ondansetron  (ZOFRAN -ODT) 4 MG disintegrating tablet DISSOLVE 1 TABLET IN MOUTH EVERY 8 HOURS AS NEEDED    [provider]  Oxcarbazepine  (TRILEPTAL ) 300 MG tablet Take 300 mg by mouth 2 (two) times daily. 03/13/20   [provider]  oxyCODONE -acetaminophen  (PERCOCET) 5-325 MG tablet Take 1-2 tablets by mouth every 4 (four) hours as needed. 08/10/23   Pollina, Lonni PARAS, MD  OZEMPIC, 1 MG/DOSE, 4 MG/3ML SOPN Inject 1 mg into the muscle once a week. INJECT THE CONTENTS OF 1 PEN INJECTOR SUBQ ONCE WEEKLY Patient not taking: Reported on 10/02/2023 02/08/20   [provider]  OZEMPIC, 2 MG/DOSE, 8 MG/3ML SOPN Inject 2 mg into the skin once a week. 12/14/22   [provider]  pramoxine (PROCTOFOAM) 1 % foam     [provider]  RELISTOR 150 MG TABS Take 3 tablets by mouth daily. 09/29/20   [provider]  rosuvastatin  (CRESTOR ) 40 MG tablet Take 1 tablet by mouth once daily 02/24/24   O'Neal, Darryle Ned, MD  testosterone  cypionate (DEPOTESTOSTERONE CYPIONATE) 200 MG/ML injection Inject 200 mg into the muscle every 21 ( twenty-one) days. 10/22/19   [provider]  tiZANidine (ZANAFLEX) 4  MG tablet Take 4 mg by mouth every 6 (six) hours as needed for muscle spasms.    [provider]  valsartan  (DIOVAN ) 80 MG tablet Take 80 mg by mouth 2 (two) times daily. 11/05/23   [provider]   Physical Exam   Vital Signs:  Blayde Bacigalupi does not have vital signs available for review today. Given telephonic nature of communication, physical exam is limited. AAOx3. NAD. Normal affect.  Speech and respirations are unlabored. Accessory Clinical Findings  None Assessment & Plan  1.  Preoperative Cardiovascular Risk Assessment: Mr. Peake perioperative risk of a major cardiac event is 0.4% according to the Revised Cardiac Risk Index (RCRI).  Therefore, he is at low risk for perioperative complications.   His functional capacity is fair at 5.29 METs according to the Duke Activity Status Index (DASI). Recommendations: According to ACC/AHA guidelines, no further cardiovascular testing needed.  The patient may proceed to surgery at acceptable risk.   Antiplatelet and/or Anticoagulation Recommendations: Eliquis  (Apixaban ) can be held for 3 days prior to surgery.  Please resume post op when felt to be safe.     The patient was advised that if he develops new symptoms prior to surgery to contact our office to arrange for a follow-up visit, and he verbalized understanding.  A copy of this note will be routed to requesting surgeon.  Time:   Today, I have spent 10 minutes with the patient with telehealth technology discussing medical history, symptoms, and management plan.    Jimmey Hengel D Taraya Steward, NP  04/01/2024, 2:57 PM

## 2024-04-07 DIAGNOSIS — M1711 Unilateral primary osteoarthritis, right knee: Secondary | ICD-10-CM | POA: Diagnosis not present

## 2024-04-07 DIAGNOSIS — Z01818 Encounter for other preprocedural examination: Secondary | ICD-10-CM | POA: Diagnosis not present

## 2024-04-07 DIAGNOSIS — Z6838 Body mass index (BMI) 38.0-38.9, adult: Secondary | ICD-10-CM | POA: Diagnosis not present

## 2024-04-07 DIAGNOSIS — E1140 Type 2 diabetes mellitus with diabetic neuropathy, unspecified: Secondary | ICD-10-CM | POA: Diagnosis not present

## 2024-04-12 DIAGNOSIS — M25561 Pain in right knee: Secondary | ICD-10-CM | POA: Diagnosis not present

## 2024-04-12 DIAGNOSIS — T402X5A Adverse effect of other opioids, initial encounter: Secondary | ICD-10-CM | POA: Diagnosis not present

## 2024-04-12 DIAGNOSIS — K5903 Drug induced constipation: Secondary | ICD-10-CM | POA: Diagnosis not present

## 2024-04-12 DIAGNOSIS — E11610 Type 2 diabetes mellitus with diabetic neuropathic arthropathy: Secondary | ICD-10-CM | POA: Diagnosis not present

## 2024-04-12 DIAGNOSIS — M25562 Pain in left knee: Secondary | ICD-10-CM | POA: Diagnosis not present

## 2024-04-12 DIAGNOSIS — F1190 Opioid use, unspecified, uncomplicated: Secondary | ICD-10-CM | POA: Diagnosis not present

## 2024-04-12 DIAGNOSIS — Z7985 Long-term (current) use of injectable non-insulin antidiabetic drugs: Secondary | ICD-10-CM | POA: Diagnosis not present

## 2024-04-12 DIAGNOSIS — G8929 Other chronic pain: Secondary | ICD-10-CM | POA: Diagnosis not present

## 2024-04-12 DIAGNOSIS — G894 Chronic pain syndrome: Secondary | ICD-10-CM | POA: Diagnosis not present

## 2024-04-17 DIAGNOSIS — I10 Essential (primary) hypertension: Secondary | ICD-10-CM | POA: Diagnosis not present

## 2024-04-17 DIAGNOSIS — E1140 Type 2 diabetes mellitus with diabetic neuropathy, unspecified: Secondary | ICD-10-CM | POA: Diagnosis not present

## 2024-04-19 ENCOUNTER — Encounter: Payer: Self-pay | Admitting: Radiology

## 2024-05-03 DIAGNOSIS — J069 Acute upper respiratory infection, unspecified: Secondary | ICD-10-CM | POA: Diagnosis not present

## 2024-05-03 DIAGNOSIS — R051 Acute cough: Secondary | ICD-10-CM | POA: Diagnosis not present

## 2024-05-06 DIAGNOSIS — Z01818 Encounter for other preprocedural examination: Secondary | ICD-10-CM | POA: Diagnosis not present

## 2024-05-06 DIAGNOSIS — E559 Vitamin D deficiency, unspecified: Secondary | ICD-10-CM | POA: Diagnosis not present

## 2024-05-06 DIAGNOSIS — M79609 Pain in unspecified limb: Secondary | ICD-10-CM | POA: Diagnosis not present

## 2024-05-06 DIAGNOSIS — Z79899 Other long term (current) drug therapy: Secondary | ICD-10-CM | POA: Diagnosis not present

## 2024-05-08 DIAGNOSIS — Z01818 Encounter for other preprocedural examination: Secondary | ICD-10-CM | POA: Diagnosis not present

## 2024-05-08 DIAGNOSIS — I4891 Unspecified atrial fibrillation: Secondary | ICD-10-CM | POA: Diagnosis not present

## 2024-05-10 DIAGNOSIS — Z79899 Other long term (current) drug therapy: Secondary | ICD-10-CM | POA: Diagnosis not present

## 2024-05-10 DIAGNOSIS — Q984 Klinefelter syndrome, unspecified: Secondary | ICD-10-CM | POA: Diagnosis not present

## 2024-05-10 DIAGNOSIS — N401 Enlarged prostate with lower urinary tract symptoms: Secondary | ICD-10-CM | POA: Diagnosis not present

## 2024-05-10 DIAGNOSIS — E291 Testicular hypofunction: Secondary | ICD-10-CM | POA: Diagnosis not present

## 2024-05-17 DIAGNOSIS — I10 Essential (primary) hypertension: Secondary | ICD-10-CM | POA: Diagnosis not present

## 2024-05-17 DIAGNOSIS — E1140 Type 2 diabetes mellitus with diabetic neuropathy, unspecified: Secondary | ICD-10-CM | POA: Diagnosis not present

## 2024-05-24 DIAGNOSIS — I10 Essential (primary) hypertension: Secondary | ICD-10-CM | POA: Diagnosis not present

## 2024-05-24 DIAGNOSIS — M1711 Unilateral primary osteoarthritis, right knee: Secondary | ICD-10-CM | POA: Diagnosis not present

## 2024-05-24 DIAGNOSIS — G8918 Other acute postprocedural pain: Secondary | ICD-10-CM | POA: Diagnosis not present

## 2024-05-28 DIAGNOSIS — S76111A Strain of right quadriceps muscle, fascia and tendon, initial encounter: Secondary | ICD-10-CM | POA: Diagnosis not present

## 2024-05-28 DIAGNOSIS — W01198A Fall on same level from slipping, tripping and stumbling with subsequent striking against other object, initial encounter: Secondary | ICD-10-CM | POA: Diagnosis not present

## 2024-05-31 DIAGNOSIS — Z743 Need for continuous supervision: Secondary | ICD-10-CM | POA: Diagnosis not present

## 2024-05-31 DIAGNOSIS — R531 Weakness: Secondary | ICD-10-CM | POA: Diagnosis not present

## 2024-06-03 DIAGNOSIS — Z96651 Presence of right artificial knee joint: Secondary | ICD-10-CM | POA: Diagnosis not present

## 2024-06-03 DIAGNOSIS — R262 Difficulty in walking, not elsewhere classified: Secondary | ICD-10-CM | POA: Diagnosis not present

## 2024-06-03 DIAGNOSIS — G8918 Other acute postprocedural pain: Secondary | ICD-10-CM | POA: Diagnosis not present

## 2024-06-03 DIAGNOSIS — I119 Hypertensive heart disease without heart failure: Secondary | ICD-10-CM | POA: Diagnosis not present

## 2024-07-17 ENCOUNTER — Other Ambulatory Visit: Payer: Self-pay | Admitting: Cardiovascular Disease

## 2024-07-17 DIAGNOSIS — I480 Paroxysmal atrial fibrillation: Secondary | ICD-10-CM

## 2024-07-19 NOTE — Telephone Encounter (Signed)
 Eliquis  5mg  refill request received. Patient is 69 years old, weight-144.5kg, Crea-1.40 on 09/04/23, Diagnosis-Afib, and last seen by Katlyn West on 04/01/24 via Preop Visit. Dose is appropriate based on dosing criteria. Will send in refill to requested pharmacy.
